# Patient Record
Sex: Male | Born: 1950 | Race: White | Hispanic: No | Marital: Married | Smoking: Former smoker
Health system: Southern US, Community
[De-identification: ages and names within clinical notes are randomized; demographics above are authoritative.]

## PROBLEM LIST (undated history)

## (undated) DIAGNOSIS — M1990 Unspecified osteoarthritis, unspecified site: Secondary | ICD-10-CM

## (undated) DIAGNOSIS — I10 Essential (primary) hypertension: Secondary | ICD-10-CM

## (undated) DIAGNOSIS — E785 Hyperlipidemia, unspecified: Secondary | ICD-10-CM

## (undated) DIAGNOSIS — C801 Malignant (primary) neoplasm, unspecified: Secondary | ICD-10-CM

## (undated) DIAGNOSIS — I2510 Atherosclerotic heart disease of native coronary artery without angina pectoris: Secondary | ICD-10-CM

## (undated) DIAGNOSIS — K219 Gastro-esophageal reflux disease without esophagitis: Secondary | ICD-10-CM

## (undated) DIAGNOSIS — C4490 Unspecified malignant neoplasm of skin, unspecified: Secondary | ICD-10-CM

## (undated) HISTORY — PX: CORONARY ANGIOPLASTY: SHX604

## (undated) HISTORY — PX: APPENDECTOMY: SHX54

## (undated) HISTORY — PX: OTHER SURGICAL HISTORY: SHX169

## (undated) HISTORY — PX: TONSILLECTOMY: SUR1361

---

## 2006-12-06 ENCOUNTER — Ambulatory Visit: Payer: Self-pay | Admitting: Gastroenterology

## 2010-01-08 ENCOUNTER — Inpatient Hospital Stay: Payer: Self-pay | Admitting: Cardiology

## 2010-01-08 IMAGING — CR DG CHEST 2V
1 series · 2 of 2 positions shown · non-contrast
Comparison: none

REASON FOR EXAM: CP
COMMENTS:

PROCEDURE:     DXR - DXR CHEST PA (OR AP) AND LATERAL  - January 08, 2010  [DATE]
RESULT:     The lung fields are clear. The heart, mediastinal and osseous
structures show no significant abnormalities.

[Series 1: view not recorded · 0.17mm/px · 2 of 2 slices shown]
[im 1/2]
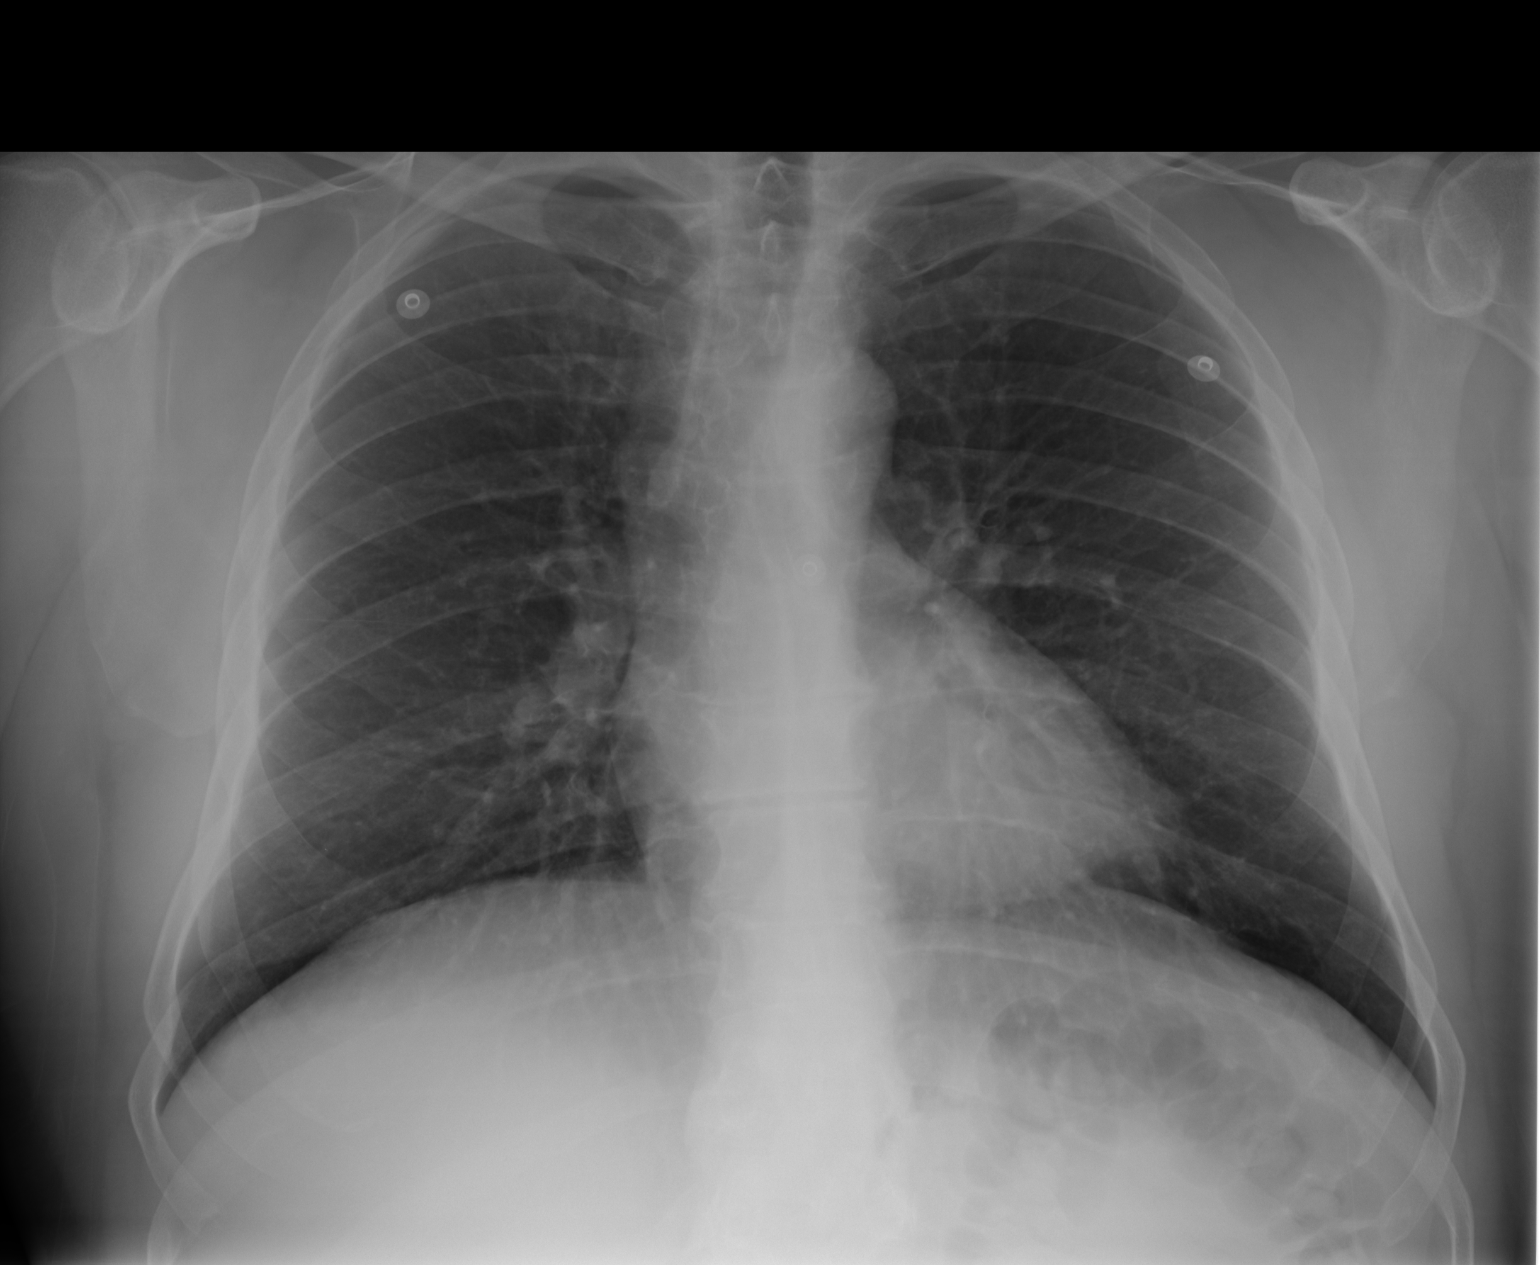
[im 2/2]
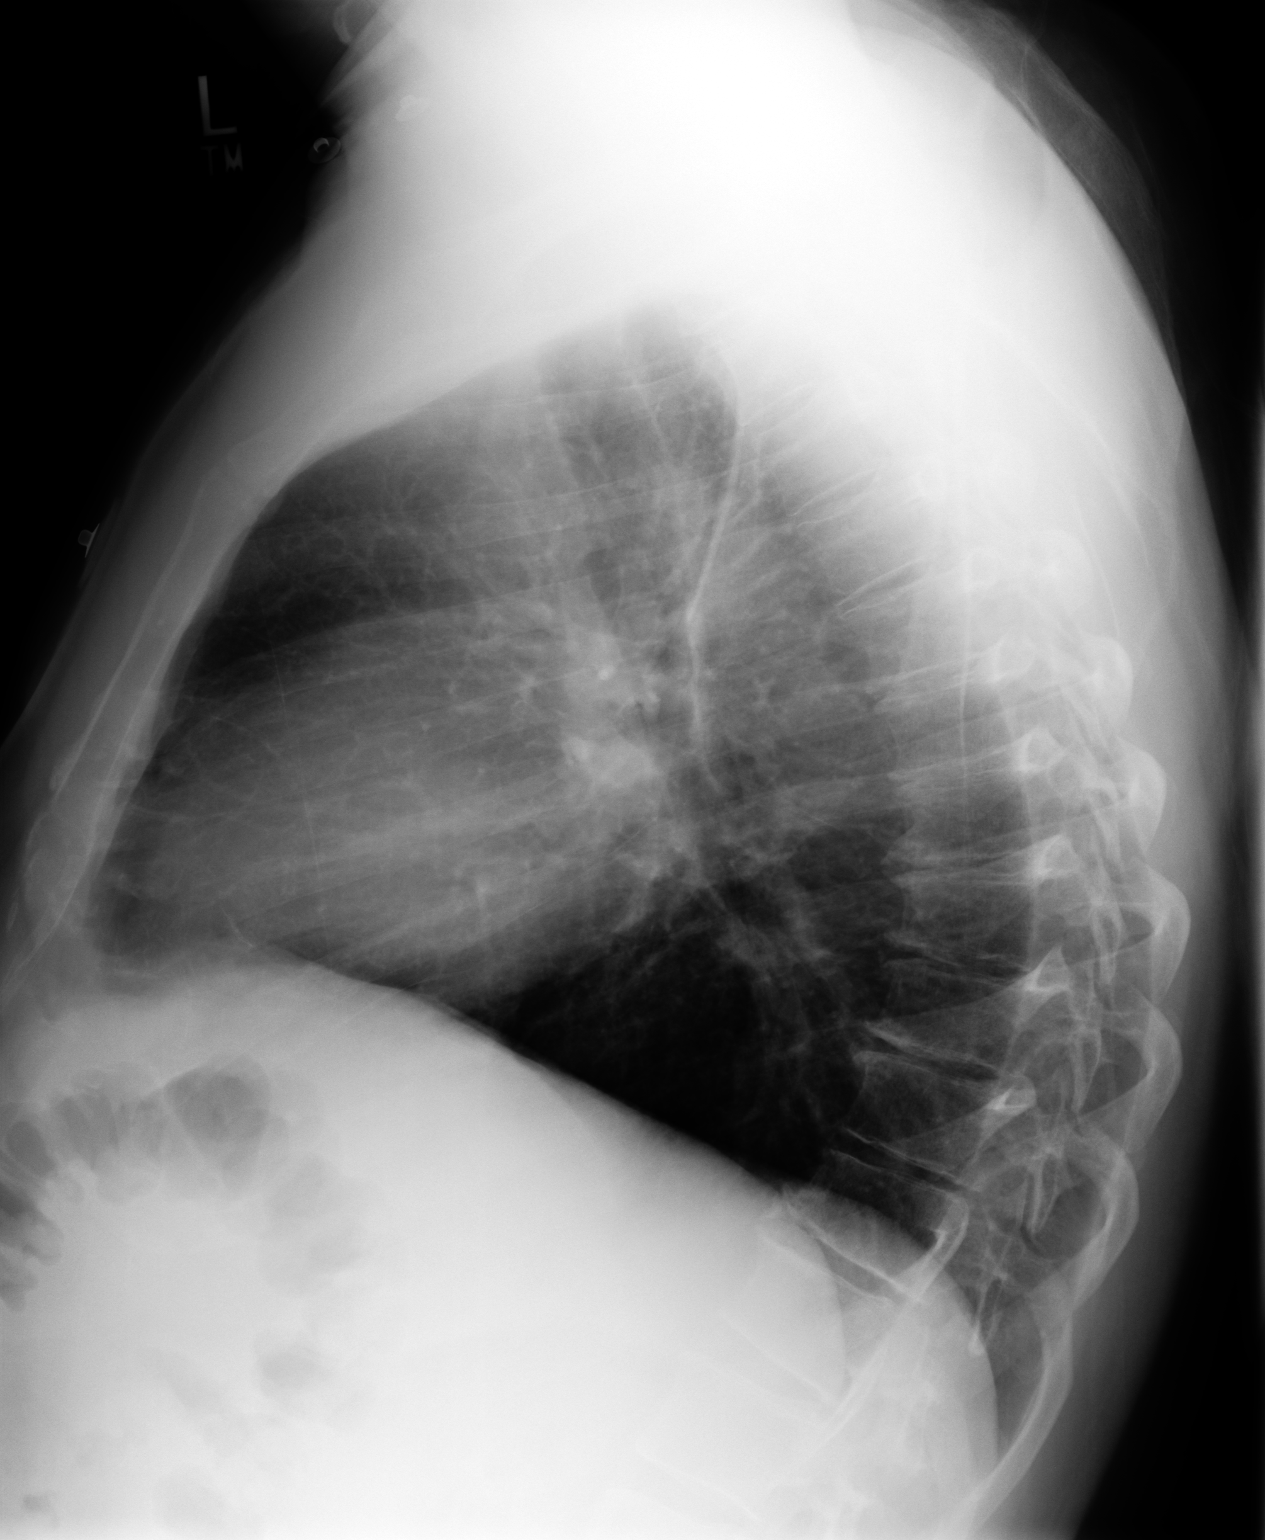

[2 of 2 positions shown; findings below may reference images not displayed]

IMPRESSION: 1.     No acute changes are identified.

## 2013-06-27 ENCOUNTER — Other Ambulatory Visit: Payer: Self-pay | Admitting: Urology

## 2013-07-03 NOTE — Patient Instructions (Signed)
Jaylon Boylen  07/03/2013   Your procedure is scheduled on: 07/16/13              Surgery [id number]   Report to [facility] at    0515  AM.  Call this number if you have problems the morning of surgery: [phone]   Remember:   Do not eat food or drink liquids after midnight.   Take these medicines the morning of surgery with A SIP OF WATER:    Do not wear jewelry,   Do not wear lotions, powders, or perfumes.   . Men may shave face and neck.  Do not bring valuables to the hospital.  Contacts, dentures or bridgework may not be worn into surgery.  Leave suitcase in the car. After surgery it may be brought to your room.  For patients admitted to the hospital, checkout time is 11:00 AM the day of  discharge.   SEE CHG INSTRUCTION SHEET    Please read over the following fact sheets that you were given:  coughing and deep breathing exercises, leg exercises, Incentive Spirometry Fact Sheet, Blood Transfusion Fact Sheet                Failure to comply with these instructions may result in cancellation of your surgery.                Patient Signature ____________________________              Nurse Signature _____________________________

## 2013-07-04 ENCOUNTER — Ambulatory Visit (HOSPITAL_COMMUNITY)
Admission: RE | Admit: 2013-07-04 | Discharge: 2013-07-04 | Disposition: A | Discharge status: Home / Self Care | Payer: BC Managed Care – PPO | Source: Ambulatory Visit | Attending: Urology | Admitting: Urology

## 2013-07-04 ENCOUNTER — Encounter (HOSPITAL_COMMUNITY)
Admission: RE | Admit: 2013-07-04 | Discharge: 2013-07-04 | Disposition: A | Discharge status: Home / Self Care | Payer: BC Managed Care – PPO | Source: Ambulatory Visit | Attending: Urology | Admitting: Urology

## 2013-07-04 ENCOUNTER — Encounter (HOSPITAL_COMMUNITY): Payer: Self-pay | Admitting: Pharmacy Technician

## 2013-07-04 ENCOUNTER — Encounter (HOSPITAL_COMMUNITY): Payer: Self-pay

## 2013-07-04 DIAGNOSIS — Z01812 Encounter for preprocedural laboratory examination: Secondary | ICD-10-CM | POA: Insufficient documentation

## 2013-07-04 DIAGNOSIS — I10 Essential (primary) hypertension: Secondary | ICD-10-CM | POA: Insufficient documentation

## 2013-07-04 DIAGNOSIS — Z01818 Encounter for other preprocedural examination: Secondary | ICD-10-CM | POA: Insufficient documentation

## 2013-07-04 DIAGNOSIS — Z01810 Encounter for preprocedural cardiovascular examination: Secondary | ICD-10-CM | POA: Insufficient documentation

## 2013-07-04 HISTORY — DX: Unspecified osteoarthritis, unspecified site: M19.90

## 2013-07-04 HISTORY — DX: Essential (primary) hypertension: I10

## 2013-07-04 HISTORY — DX: Atherosclerotic heart disease of native coronary artery without angina pectoris: I25.10

## 2013-07-04 HISTORY — DX: Gastro-esophageal reflux disease without esophagitis: K21.9

## 2013-07-04 HISTORY — DX: Malignant (primary) neoplasm, unspecified: C80.1

## 2013-07-04 LAB — CBC
MCV: 90.1 fL (ref 78.0–100.0)
Platelets: 218 10*3/uL (ref 150–400)
RBC: 4.86 MIL/uL (ref 4.22–5.81)
WBC: 8.1 10*3/uL (ref 4.0–10.5)

## 2013-07-04 LAB — BASIC METABOLIC PANEL
CO2: 28 mEq/L (ref 19–32)
Calcium: 10.1 mg/dL (ref 8.4–10.5)
Chloride: 103 mEq/L (ref 96–112)
GFR calc Af Amer: 76 mL/min — ABNORMAL LOW (ref >90)
Sodium: 138 mEq/L (ref 135–145)

## 2013-07-04 IMAGING — CR DG CHEST 2V
2 series · 2 of 2 positions shown · non-contrast
Comparison: None

CLINICAL DATA: Preop robotic prostatectomy, history of hypertension
and cardiac stents

CHEST - 2 VIEW

[w chest pa]
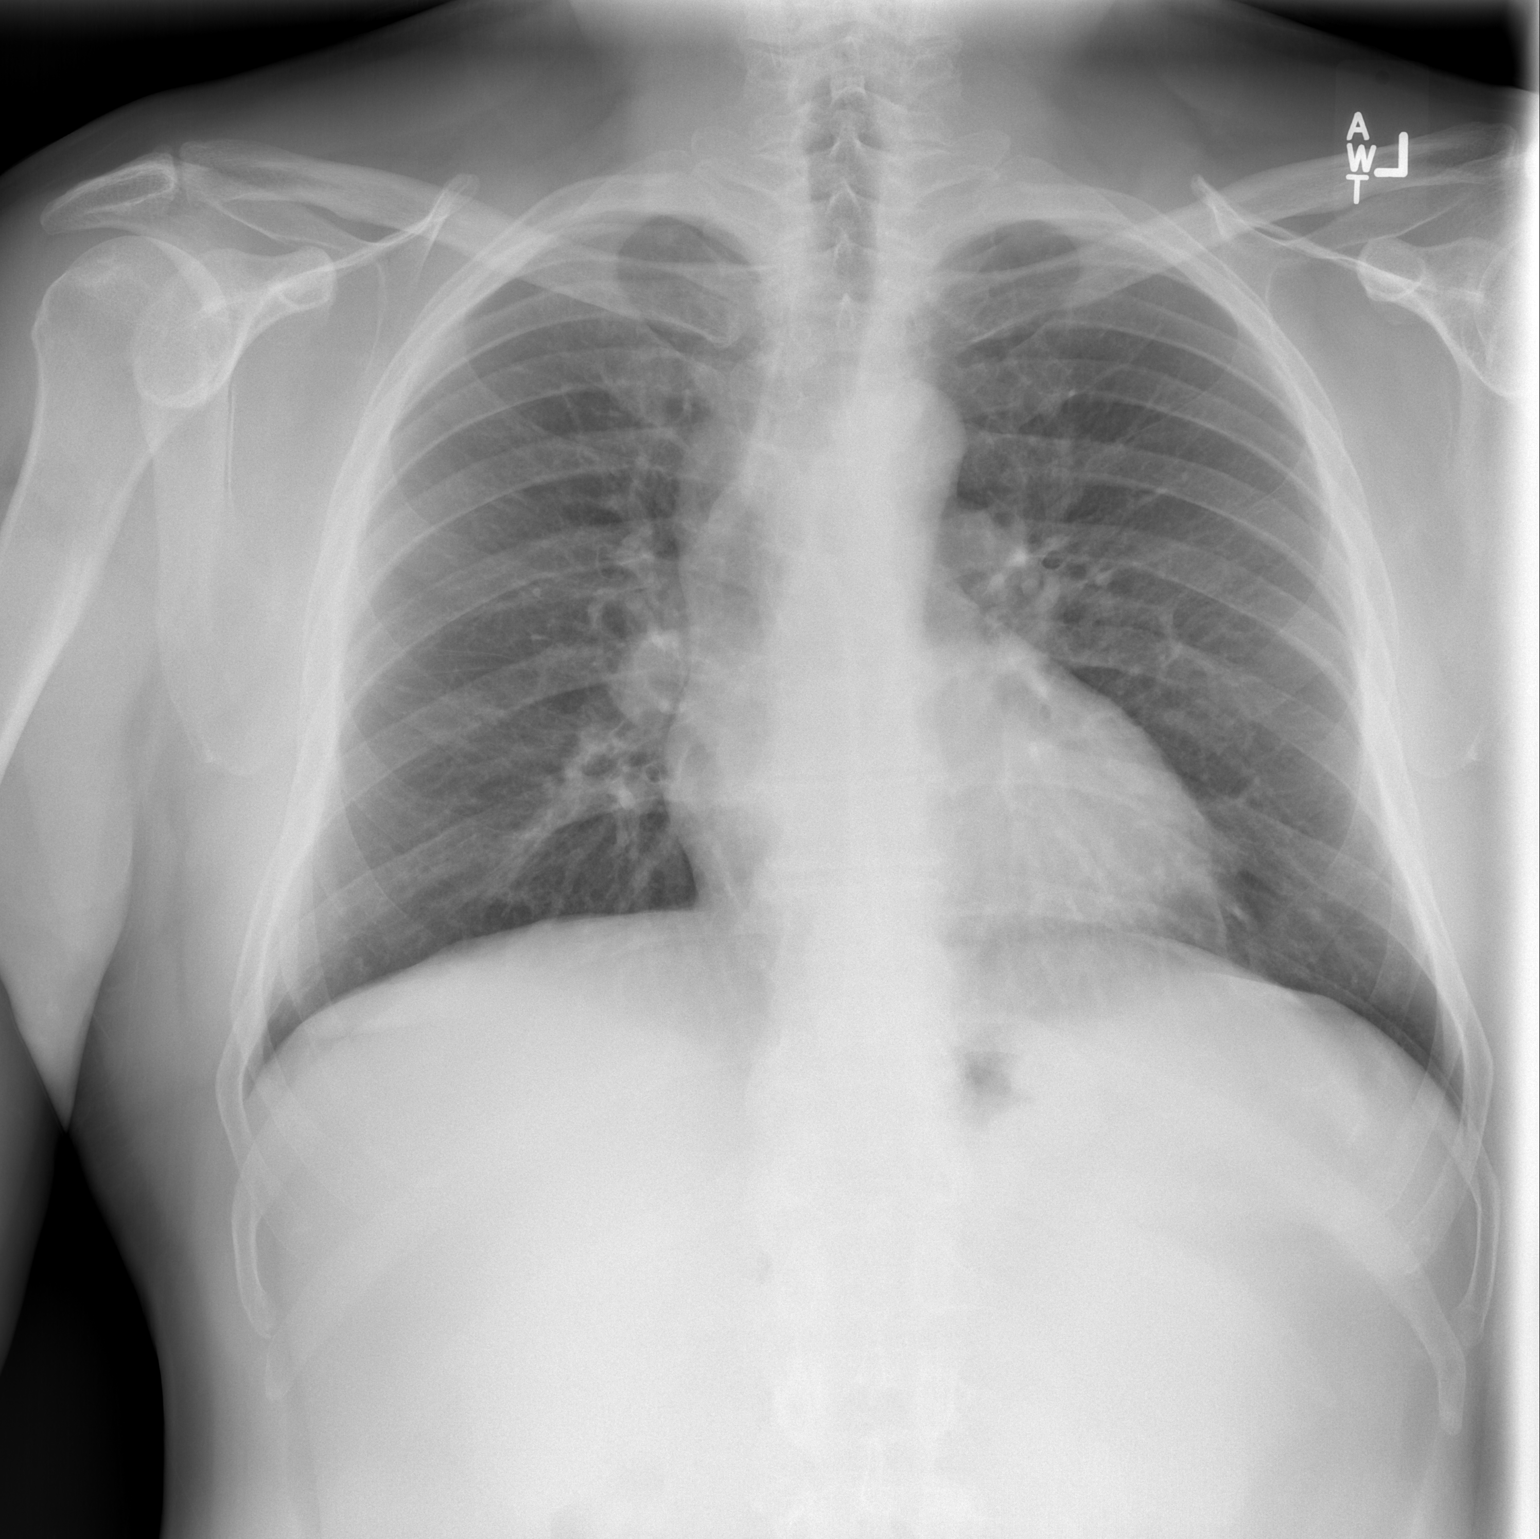

[w chest lat]
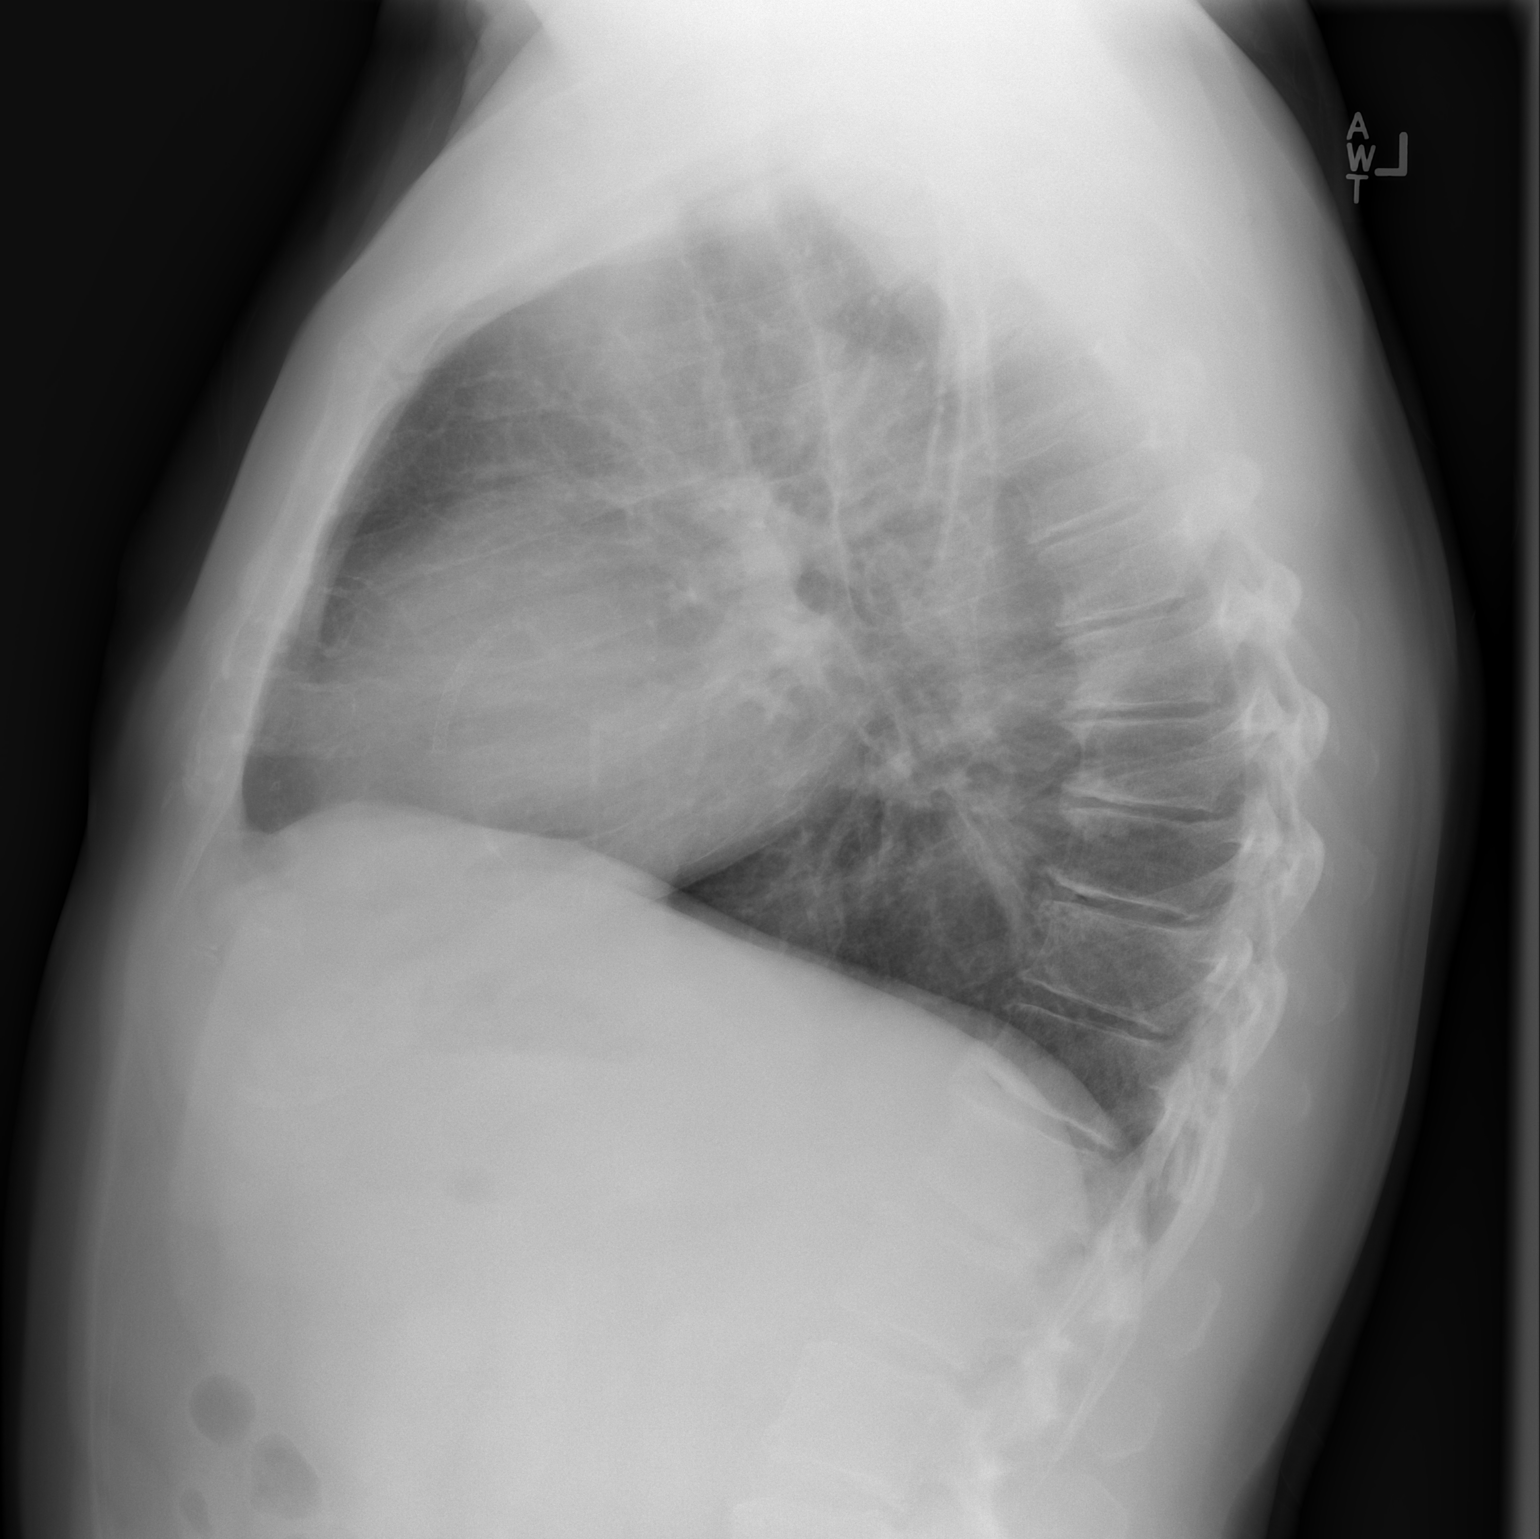

[2 of 2 positions shown; findings below may reference images not displayed]

FINDINGS: The heart size and vascular pattern are normal.  The lungs
are clear.  No pleural effusions.}
IMPRESSION: [No acute findings.]

## 2013-07-04 NOTE — Progress Notes (Signed)
Stent Mid RCA- 2000 at [facility]  Stent prox RCA 01/09/10 at [facility]  Last office visit note with Dr Darrold Junker 04/03/2013 on chart Last EKG from Dr Darrold Junker 04/2012 on chart

## 2013-07-13 NOTE — H&P (Signed)
Chief Complaint  Prostate Cancer   Reason For Visit  Reason for consult: To discuss treatment options for prostate cancer and specifically to consider a robotic prostatectomy. Physician requesting consult: Dr. Jethro Ellison PCP: Dr. Bethann Punches   History of Present Illness  Cody Ellison is a 62 year old who was noted to have a prostate nodule during a routine physical exam this year.  His PSA was 4.67. He was seen by Dr. Patsi Sears and confirmed to have a nodule at the right base of the prostate prompting a prostate biopsy on 05/29/13.  This confirmed Gleason 4+3=7 adenocarcinoma of the prostate with 10 out of 12 biopsy cores positive for malignancy. Dr. Patsi Sears ordered a CT scan of the abdomen and pelvis on 06/13/13 which was negative for obvious metastatic disease. He has no family history of prostate cancer.  His father lived to be 55.   ** He does have a history of coronary artery disease he initially underwent angioplasty approximately 20 years ago and subsequently had a cardiac stent placed about 15 years ago.  More recently, he had a drug-eluting cardiac stent placed 3-4 years ago.  He currently is on chronic antiplatelet therapy with aspirin 325 mg daily.  His cardiologist is Dr. Dorma Russell in [place].  TNM stage: cT2b N0 M0 (R base) PSA: 4.67 Gleason score: 4+3=7 Biopsy (05/29/13): 10/12 cores positive   Left: L lateral apex (30%, 3+3=6), L apex (60%, 3+3=6, PNI), L lateral mid (90%, 3+3=6), L mid (60%, 3+3=6), L lateral base (70%, 3+3=6), L base (95%< 3+4=7)   Right: R lateral apex (70%, 4+3=7), R mid (60%, 4+3=7), R lateral mid (90%, 4+3=7), R lateral base (90%, 4+3=7, PNI) Prostate volume: 61.0 cc  Nomogram: OC disease: 66% EPE: 41% SVI: 3% LNI: 2.3% PFS (surgery): 84%, 77%  Urinary function: He has minimal baseline voiding symptoms.  IPSS is 5. Erectile function: He does have severe pre-existing erectile dysfunction.  SHIM score is 8.   Past Medical  History Problems  1. History of  Coronary Artery Disease V12.59 2. History of  Esophageal Reflux 530.81 3. History of  Hyperlipidemia 272.4 4. History of  Hypertension 401.9  Surgical History Problems  1. History of  Adenoidectomy 2. History of  Appendectomy 3. History of  Cath Laser Angioplasty 4. History of  Cath Stent Placement 5. History of  Cath Stent Placement 6. History of  Tonsillectomy  Current Meds 1. Aspirin 325 MG Oral Tablet; Therapy: (Recorded:09Jun2014) to 2. Atenolol 100 MG Oral Tablet; Therapy: (Recorded:09Jun2014) to 3. Calcium 600 + D TABS; Therapy: (Recorded:09Jun2014) to 4. CoQ10 CAPS; Therapy: (Recorded:09Jun2014) to 5. Crestor 20 MG Oral Tablet; Therapy: (Recorded:09Jun2014) to 6. Felodipine ER 10 MG Oral Tablet Extended Release 24 Hour; Therapy: (Recorded:09Jun2014) to 7. Folic Acid 800 MCG Oral Tablet; Therapy: (Recorded:09Jun2014) to 8. Lisinopril-Hydrochlorothiazide 20-12.5 MG Oral Tablet; Therapy: (Recorded:09Jun2014) to 9. Niacin 500 MG Oral Tablet; Therapy: (Recorded:09Jun2014) to 10. Omeprazole 20 MG Oral Capsule Delayed Release; Therapy: (Recorded:09Jun2014) to 11. Super B-Complex TABS; Therapy: (Recorded:09Jun2014) to 12. Vitamin E 400 UNIT Oral Capsule; Therapy: (Recorded:09Jun2014) to  Allergies Medication  1. Iodine SOLN 2. Sulfa Drugs 3. Ampicillin CAPS Non-Medication  4. Contrast Dye  Family History Problems  1. Fraternal history of  Cancer 2. Maternal history of  Cervical Cancer 3. Family history of  Father Deceased At Age 91 4. Family history of  Mother Deceased At Age 109 Denied  5. Family history of  Prostate Cancer  Social History Problems    Former Smoker [phone]  1 ppd x 15yrs   Marital History - Currently Married   Occupation: Owns Industrial/product designer    History of  Alcohol Use  Review of Systems Genitourinary, constitutional, skin, eye, otolaryngeal, hematologic/lymphatic, cardiovascular, pulmonary,  endocrine, musculoskeletal, gastrointestinal, neurological and psychiatric system(s) were reviewed and pertinent findings if present are noted.  Cardiovascular: no chest pain and no leg swelling.  Respiratory: no shortness of breath and no shortness of breath during exertion.    Vitals Vital Signs [Data Includes: Last 1 Day]  26Aug2014 09:06AM  BMI Calculated: 31.42 BSA Calculated: 2.15 Height: 5 ft 9.5 in Weight: 217 lb  Blood Pressure: 147 / 65 Temperature: 97.4 F Heart Rate: 62  Physical Exam Constitutional: Well nourished and well developed . No acute distress.  ENT:. The ears and nose are normal in appearance.  Neck: The appearance of the neck is normal and no neck mass is present.  Pulmonary: No respiratory distress, normal respiratory rhythm and effort and clear bilateral breath sounds.  Cardiovascular: Heart rate and rhythm are normal . No peripheral edema.  Abdomen: right lower quadrant incision site(s) well healed. The abdomen is soft and nontender. No masses are palpated. No CVA tenderness. No hernias are palpable. No hepatosplenomegaly noted.  Rectal: His prostate measures approximately 40 cc. He does have significant nodularity located along the right base which appears confined to the prostate although it is a fairly sizable lesion and encompasses approximately 1 half of the prostate's right lobe.  Lymphatics: The femoral and inguinal nodes are not enlarged or tender.  Skin: Normal skin turgor, no visible rash and no visible skin lesions.  Neuro/Psych:. Mood and affect are appropriate.    Results/Data Urine [Data Includes: Last 1 Day]   26Aug2014  COLOR YELLOW   APPEARANCE CLEAR   SPECIFIC GRAVITY 1.015   pH 5.5   GLUCOSE NEG mg/dL  BILIRUBIN NEG   KETONE NEG mg/dL  BLOOD NEG   PROTEIN NEG mg/dL  UROBILINOGEN 0.2 mg/dL  NITRITE NEG   LEUKOCYTE ESTERASE NEG   Selected Results  BUN & CREATININE 13Aug2014 09:06AM Cody Ellison  SPECIMEN TYPE: BLOOD    Test Name Result Flag Reference  CREATININE 1.40 mg/dL  1.61-0.96  BUN 25 mg/dL  0-45  Est GFR, African American 62 mL/min    PERFORMED AT:        [facility]                      [address]                      [place], [place] [address]  Est GFR, NonAfrican American 54 mL/min L   THE ESTIMATED GFR IS A CALCULATION VALID FOR ADULTS (>=27 YEARS OLD) THAT USES THE CKD-EPI ALGORITHM TO ADJUST FOR AGE AND SEX. IT IS   NOT TO BE USED FOR CHILDREN, PREGNANT WOMEN, HOSPITALIZED PATIENTS,    PATIENTS ON DIALYSIS, OR WITH RAPIDLY CHANGING KIDNEY FUNCTION. ACCORDING TO THE NKDEP, EGFR >89 IS NORMAL, 60-89 SHOWS MILD IMPAIRMENT, 30-59 SHOWS MODERATE IMPAIRMENT, 15-29 SHOWS SEVERE IMPAIRMENT AND <15 IS ESRD.   CT-ABD/PELVIS W/W/O CONTRAST 13Aug2014 12:00AM Cody Ellison   Test Name Result Flag Reference  ** RADIOLOGY REPORT BY [facility] **   *RADIOLOGY REPORT*  Clinical Data: Newly diagnosed prostate cancer  CT ABDOMEN AND PELVIS WITHOUT AND WITH CONTRAST  Technique: Multidetector CT imaging of the abdomen and pelvis was performed without contrast material in one or  both body regions, followed by contrast material(s) and further sections in one or both body regions.  Contrast: 125 ml Isovue 300 IV  Comparison: None.  Findings: Lung bases are clear.  Liver, spleen, pancreas, and adrenal glands are within normal limits.  Gallbladder is unremarkable. No intrahepatic or extrahepatic ductal dilatation.  Kidneys are within normal limits. No hydronephrosis.  No evidence of bowel obstruction. Prior appendectomy. Atherosclerotic calcifications of the abdominal aorta and branch vessels.  No abdominopelvic ascites.  No suspicious abdominopelvic lymphadenopathy.  Prostate is notable for mild enlargement of the central gland which indents the base of the bladder (series 3/image 86).  No ureteral or bladder calculi.  On delayed imaging, there  are no filling defects in the bilateral opacified proximal collecting systems or ureters, or bladder. A segment of the distal left ureter remains unopacified.  Degenerative changes of the visualized thoracolumbar spine. No focal osseous lesions.  IMPRESSION: Prostate is notable for mild enlargement of the central gland which indents the base of the bladder.  No evidence of metastatic disease in the abdomen/pelvis.   Original Report Authenticated By: Charline Bills, M.D.     I have reviewed his medical records, pathology report, CT scan, and PSA results.  Findings are as dictated above.  Assessment Assessed  1. Prostate Cancer 185  Plan Health Maintenance (V70.0)  1. UA With REFLEX  Done: 26Aug2014 08:55AM Prostate Cancer (185)  2. Follow-up Schedule Surgery Office  Follow-up  Done: 26Aug2014 3. PT/OT Referral Referral  Referral  Requested for: 26Aug2014  Discussion/Summary  1.  Prostate cancer: I had a long detailed discussion with Mr. Willert and his wife today.  I recommended therapy of curative intent considering his relatively high-grade disease.  We reviewed options and specifically focused our discussion today on surgical treatment options and radiotherapy options.   The patient was counseled about the natural history of prostate cancer and the standard treatment options that are available for prostate cancer. It was explained to him how his age and life expectancy, clinical stage, Gleason score, and PSA affect his prognosis, the decision to proceed with additional staging studies, as well as how that information influences recommended treatment strategies. We discussed the roles for active surveillance, radiation therapy, surgical therapy, androgen deprivation, as well as ablative therapy options for the treatment of prostate cancer as appropriate to his individual cancer situation. We discussed the risks and benefits of these options with regard to their impact on cancer  control and also in terms of potential adverse events, complications, and impact on quiality of life particularly related to urinary, bowel, and sexual function. The patient was encouraged to ask questions throughout the discussion today and all questions were answered to his stated satisfaction. In addition, the patient was provided with and/or directed to appropriate resources and literature for further education about prostate cancer and treatment options.   We discussed surgical therapy for prostate cancer including the different available surgical approaches. We discussed, in detail, the risks and expectations of surgery with regard to cancer control, urinary control, and erectile function as well as the expected postoperative recovery process. Additional risks of surgery including but not limited to bleeding, infection, hernia formation, nerve damage, lymphocele formation, bowel/rectal injury potentially necessitating colostomy, damage to the urinary tract resulting in urine leakage, urethral stricture, and the cardiopulmonary risks such as myocardial infarction, stroke, death, venothromboembolism, etc. were explained. The risk of open surgical conversion for robotic/laparoscopic prostatectomy was also discussed.   After our discussion, he is most inclined  to proceed with surgical therapy.  I did offer him a radiation oncology consultation which also was offered to him by Dr. Patsi Sears.  He really does not wish to consider radiation therapy and wishes to proceed with surgical treatment.  I did recommend that he receive cardiac clearance prior to surgery.  Furthermore, I recommended that he switched to aspirin 81 mg perioperatively but to continue this around the time of surgery to minimize risk of thrombosing his cardiac stent.  He feels very well informed and is agreeable to proceed with surgical therapy.  He will be scheduled for a unilateral left nerve sparing robotic-assisted laparoscopic radical  prostatectomy and bilateral pelvic lymphadenectomy.  Cc: Dr. Jethro Ellison Dr. Bethann Punches    SignaturesElectronically signed by : Heloise Purpura, M.D.; Jun 26 2013  3:23PM

## 2013-07-15 ENCOUNTER — Encounter (HOSPITAL_COMMUNITY): Payer: Self-pay | Admitting: Anesthesiology

## 2013-07-15 NOTE — Anesthesia Preprocedure Evaluation (Addendum)
Anesthesia Evaluation  Patient identified by MRN, date of birth, ID band Patient awake    Reviewed: Allergy & Precautions, H&P , NPO status , Patient's Chart, lab work & pertinent test results  Airway Mallampati: II TM Distance: >3 FB Neck ROM: Full    Dental no notable dental hx.    Pulmonary former smoker,  CXR: No acute findings breath sounds clear to auscultation  Pulmonary exam normal       Cardiovascular Exercise Tolerance: Good hypertension, Pt. on medications and Pt. on home beta blockers + CAD and + Cardiac Stents Rhythm:Regular Rate:Normal  ECG:SB 54 otherwise normal.  2 Stents RCA last in 2011. No cardiac symptoms. Clearance Cardiologist June 2014   Neuro/Psych negative neurological ROS  negative psych ROS   GI/Hepatic Neg liver ROS, GERD-  Medicated,  Endo/Other  negative endocrine ROS  Renal/GU negative Renal ROS  negative genitourinary   Musculoskeletal negative musculoskeletal ROS (+)   Abdominal (+) + obese,   Peds negative pediatric ROS (+)  Hematology negative hematology ROS (+)   Anesthesia Other Findings   Reproductive/Obstetrics negative OB ROS                        Anesthesia Physical Anesthesia Plan  ASA: III  Anesthesia Plan: General   Post-op Pain Management:    Induction: Intravenous  Airway Management Planned: Oral ETT  Additional Equipment:   Intra-op Plan:   Post-operative Plan: Extubation in OR  Informed Consent: I have reviewed the patients History and Physical, chart, labs and discussed the procedure including the risks, benefits and alternatives for the proposed anesthesia with the patient or authorized representative who has indicated his/her understanding and acceptance.   Dental advisory given  Plan Discussed with: CRNA  Anesthesia Plan Comments:        Anesthesia Quick Evaluation

## 2013-07-16 ENCOUNTER — Ambulatory Visit (HOSPITAL_COMMUNITY): Payer: BC Managed Care – PPO | Admitting: Anesthesiology

## 2013-07-16 ENCOUNTER — Encounter (HOSPITAL_COMMUNITY)
Admission: RE | Disposition: A | Discharge status: Home / Self Care | Payer: Self-pay | Source: Ambulatory Visit | Attending: Urology

## 2013-07-16 ENCOUNTER — Encounter (HOSPITAL_COMMUNITY): Payer: Self-pay | Admitting: Anesthesiology

## 2013-07-16 ENCOUNTER — Observation Stay (HOSPITAL_COMMUNITY)
Admission: RE | Admit: 2013-07-16 | Discharge: 2013-07-17 | Disposition: A | Discharge status: Home / Self Care | Payer: BC Managed Care – PPO | Source: Ambulatory Visit | Attending: Urology | Admitting: Urology

## 2013-07-16 DIAGNOSIS — C61 Malignant neoplasm of prostate: Principal | ICD-10-CM | POA: Insufficient documentation

## 2013-07-16 DIAGNOSIS — K219 Gastro-esophageal reflux disease without esophagitis: Secondary | ICD-10-CM | POA: Insufficient documentation

## 2013-07-16 DIAGNOSIS — Z7982 Long term (current) use of aspirin: Secondary | ICD-10-CM | POA: Insufficient documentation

## 2013-07-16 DIAGNOSIS — Z7902 Long term (current) use of antithrombotics/antiplatelets: Secondary | ICD-10-CM | POA: Insufficient documentation

## 2013-07-16 DIAGNOSIS — E785 Hyperlipidemia, unspecified: Secondary | ICD-10-CM | POA: Insufficient documentation

## 2013-07-16 DIAGNOSIS — I10 Essential (primary) hypertension: Secondary | ICD-10-CM | POA: Insufficient documentation

## 2013-07-16 DIAGNOSIS — I2510 Atherosclerotic heart disease of native coronary artery without angina pectoris: Secondary | ICD-10-CM | POA: Insufficient documentation

## 2013-07-16 HISTORY — PX: ROBOT ASSISTED LAPAROSCOPIC RADICAL PROSTATECTOMY: SHX5141

## 2013-07-16 LAB — HEMOGLOBIN AND HEMATOCRIT, BLOOD
HCT: 44.7 % (ref 39.0–52.0)
Hemoglobin: 15.2 g/dL (ref 13.0–17.0)

## 2013-07-16 SURGERY — ROBOTIC ASSISTED LAPAROSCOPIC RADICAL PROSTATECTOMY LEVEL 2
Anesthesia: General | Wound class: Clean Contaminated

## 2013-07-16 MED ORDER — LIDOCAINE HCL (CARDIAC) 20 MG/ML IV SOLN
INTRAVENOUS | Status: DC | PRN
  Administered 2013-07-16: 50 mg via INTRAVENOUS

## 2013-07-16 MED ORDER — DIPHENHYDRAMINE HCL 12.5 MG/5ML PO ELIX: 12.5000 mg | ORAL_SOLUTION | Freq: Four times a day (QID) | ORAL | Status: DC | PRN

## 2013-07-16 MED ORDER — PANTOPRAZOLE SODIUM 40 MG PO TBEC
40.0000 mg | DELAYED_RELEASE_TABLET | Freq: Every day | ORAL | Status: DC
  Administered 2013-07-17: 10:00:00 40 mg via ORAL
  Filled 2013-07-16: qty 1

## 2013-07-16 MED ORDER — PROMETHAZINE HCL 25 MG/ML IJ SOLN: 6.2500 mg | INTRAMUSCULAR | Status: DC | PRN

## 2013-07-16 MED ORDER — ATORVASTATIN CALCIUM 40 MG PO TABS
40.0000 mg | ORAL_TABLET | Freq: Every day | ORAL | Status: DC
  Administered 2013-07-16: 18:00:00 40 mg via ORAL
  Filled 2013-07-16 (×2): qty 1

## 2013-07-16 MED ORDER — HYDROMORPHONE HCL PF 1 MG/ML IJ SOLN
INTRAMUSCULAR | Status: DC | PRN
  Administered 2013-07-16 (×2): 0.5 mg via INTRAVENOUS

## 2013-07-16 MED ORDER — LISINOPRIL-HYDROCHLOROTHIAZIDE 20-12.5 MG PO TABS: 1.0000 | ORAL_TABLET | Freq: Every morning | ORAL | Status: DC

## 2013-07-16 MED ORDER — MORPHINE SULFATE 2 MG/ML IJ SOLN
2.0000 mg | INTRAMUSCULAR | Status: DC | PRN
  Administered 2013-07-16: 2 mg via INTRAVENOUS
  Filled 2013-07-16: qty 1

## 2013-07-16 MED ORDER — KCL IN DEXTROSE-NACL 20-5-0.45 MEQ/L-%-% IV SOLN
INTRAVENOUS | Status: DC
  Administered 2013-07-16 – 2013-07-17 (×2): via INTRAVENOUS
  Filled 2013-07-16 (×5): qty 1000

## 2013-07-16 MED ORDER — HYDROCHLOROTHIAZIDE 12.5 MG PO CAPS
12.5000 mg | ORAL_CAPSULE | Freq: Every day | ORAL | Status: DC
  Administered 2013-07-17: 12.5 mg via ORAL
  Filled 2013-07-16: qty 1

## 2013-07-16 MED ORDER — LISINOPRIL 20 MG PO TABS
20.0000 mg | ORAL_TABLET | Freq: Every day | ORAL | Status: DC
  Administered 2013-07-17: 10:00:00 20 mg via ORAL
  Filled 2013-07-16: qty 1

## 2013-07-16 MED ORDER — BUPIVACAINE-EPINEPHRINE 0.25% -1:200000 IJ SOLN
INTRAMUSCULAR | Status: DC | PRN
  Administered 2013-07-16: 27 mL

## 2013-07-16 MED ORDER — STERILE WATER FOR IRRIGATION IR SOLN
Status: DC | PRN
  Administered 2013-07-16: 3000 mL

## 2013-07-16 MED ORDER — ACETAMINOPHEN 325 MG PO TABS: 650.0000 mg | ORAL_TABLET | ORAL | Status: DC | PRN

## 2013-07-16 MED ORDER — KETOROLAC TROMETHAMINE 15 MG/ML IJ SOLN
15.0000 mg | Freq: Four times a day (QID) | INTRAMUSCULAR | Status: DC
  Administered 2013-07-16 – 2013-07-17 (×4): 15 mg via INTRAVENOUS
  Filled 2013-07-16 (×5): qty 1

## 2013-07-16 MED ORDER — CEFAZOLIN SODIUM-DEXTROSE 2-3 GM-% IV SOLR
2.0000 g | INTRAVENOUS | Status: AC
  Administered 2013-07-16: 2 g via INTRAVENOUS

## 2013-07-16 MED ORDER — HYDROMORPHONE HCL PF 1 MG/ML IJ SOLN
0.2500 mg | INTRAMUSCULAR | Status: DC | PRN
  Administered 2013-07-16 (×4): 0.5 mg via INTRAVENOUS

## 2013-07-16 MED ORDER — SUFENTANIL CITRATE 50 MCG/ML IV SOLN
INTRAVENOUS | Status: DC | PRN
  Administered 2013-07-16: 5 ug via INTRAVENOUS
  Administered 2013-07-16 (×2): 10 ug via INTRAVENOUS
  Administered 2013-07-16 (×2): 5 ug via INTRAVENOUS

## 2013-07-16 MED ORDER — ASPIRIN EC 81 MG PO TBEC
81.0000 mg | DELAYED_RELEASE_TABLET | Freq: Every day | ORAL | Status: DC
  Administered 2013-07-16 – 2013-07-17 (×2): 81 mg via ORAL
  Filled 2013-07-16 (×2): qty 1

## 2013-07-16 MED ORDER — GLYCOPYRROLATE 0.2 MG/ML IJ SOLN
INTRAMUSCULAR | Status: DC | PRN
  Administered 2013-07-16: .8 mg via INTRAVENOUS

## 2013-07-16 MED ORDER — METOCLOPRAMIDE HCL 5 MG/ML IJ SOLN
INTRAMUSCULAR | Status: DC | PRN
  Administered 2013-07-16: 10 mg via INTRAVENOUS

## 2013-07-16 MED ORDER — INFLUENZA VAC SPLIT QUAD 0.5 ML IM SUSP
0.5000 mL | INTRAMUSCULAR | Status: AC
  Administered 2013-07-17: 0.5 mL via INTRAMUSCULAR
  Filled 2013-07-16 (×2): qty 0.5

## 2013-07-16 MED ORDER — LACTATED RINGERS IV SOLN
INTRAVENOUS | Status: DC | PRN
  Administered 2013-07-16 (×2): via INTRAVENOUS

## 2013-07-16 MED ORDER — ATENOLOL 100 MG PO TABS
100.0000 mg | ORAL_TABLET | Freq: Every morning | ORAL | Status: DC
  Administered 2013-07-17: 10:00:00 100 mg via ORAL
  Filled 2013-07-16: qty 1

## 2013-07-16 MED ORDER — SODIUM CHLORIDE 0.9 % IV BOLUS (SEPSIS)
1000.0000 mL | Freq: Once | INTRAVENOUS | Status: AC
  Administered 2013-07-16: 1000 mL via INTRAVENOUS

## 2013-07-16 MED ORDER — CISATRACURIUM BESYLATE (PF) 10 MG/5ML IV SOLN
INTRAVENOUS | Status: DC | PRN
  Administered 2013-07-16: 4 mg via INTRAVENOUS
  Administered 2013-07-16: 2 mg via INTRAVENOUS

## 2013-07-16 MED ORDER — HYDROMORPHONE HCL PF 1 MG/ML IJ SOLN
INTRAMUSCULAR | Status: AC
Start: 2013-07-16 — End: 2013-07-16
  Filled 2013-07-16: qty 1

## 2013-07-16 MED ORDER — HYDROCODONE-ACETAMINOPHEN 5-325 MG PO TABS: 1.0000 | ORAL_TABLET | Freq: Four times a day (QID) | ORAL | Status: DC | PRN

## 2013-07-16 MED ORDER — ROCURONIUM BROMIDE 100 MG/10ML IV SOLN
INTRAVENOUS | Status: DC | PRN
  Administered 2013-07-16: 50 mg via INTRAVENOUS

## 2013-07-16 MED ORDER — DOCUSATE SODIUM 100 MG PO CAPS
100.0000 mg | ORAL_CAPSULE | Freq: Two times a day (BID) | ORAL | Status: DC
  Administered 2013-07-16 – 2013-07-17 (×3): 100 mg via ORAL
  Filled 2013-07-16 (×4): qty 1

## 2013-07-16 MED ORDER — CEFAZOLIN SODIUM 1-5 GM-% IV SOLN
1.0000 g | Freq: Three times a day (TID) | INTRAVENOUS | Status: AC
  Administered 2013-07-16 – 2013-07-17 (×2): 1 g via INTRAVENOUS
  Filled 2013-07-16 (×2): qty 50

## 2013-07-16 MED ORDER — CIPROFLOXACIN HCL 500 MG PO TABS: 500.0000 mg | ORAL_TABLET | Freq: Two times a day (BID) | ORAL | Status: DC

## 2013-07-16 MED ORDER — SODIUM CHLORIDE 0.9 % IR SOLN
Status: DC | PRN
  Administered 2013-07-16: 1000 mL via INTRAVESICAL

## 2013-07-16 MED ORDER — NEOSTIGMINE METHYLSULFATE 1 MG/ML IJ SOLN
INTRAMUSCULAR | Status: DC | PRN
  Administered 2013-07-16: 5 mg via INTRAVENOUS

## 2013-07-16 MED ORDER — ONDANSETRON HCL 4 MG/2ML IJ SOLN
INTRAMUSCULAR | Status: DC | PRN
  Administered 2013-07-16: 4 mg via INTRAVENOUS

## 2013-07-16 MED ORDER — DIPHENHYDRAMINE HCL 50 MG/ML IJ SOLN: 12.5000 mg | Freq: Four times a day (QID) | INTRAMUSCULAR | Status: DC | PRN

## 2013-07-16 MED ORDER — KETOROLAC TROMETHAMINE 15 MG/ML IJ SOLN
INTRAMUSCULAR | Status: AC
  Filled 2013-07-16: qty 1

## 2013-07-16 MED ORDER — PROPOFOL 10 MG/ML IV BOLUS
INTRAVENOUS | Status: DC | PRN
  Administered 2013-07-16: 170 mg via INTRAVENOUS

## 2013-07-16 MED ORDER — HYDROMORPHONE HCL PF 1 MG/ML IJ SOLN
INTRAMUSCULAR | Status: AC
  Filled 2013-07-16: qty 1

## 2013-07-16 MED ORDER — KCL IN DEXTROSE-NACL 20-5-0.45 MEQ/L-%-% IV SOLN
INTRAVENOUS | Status: AC
  Filled 2013-07-16: qty 1000

## 2013-07-16 MED ORDER — CEFAZOLIN SODIUM-DEXTROSE 2-3 GM-% IV SOLR
INTRAVENOUS | Status: AC
  Filled 2013-07-16: qty 50

## 2013-07-16 MED ORDER — BUPIVACAINE-EPINEPHRINE PF 0.25-1:200000 % IJ SOLN
INTRAMUSCULAR | Status: AC
  Filled 2013-07-16: qty 30

## 2013-07-16 MED ORDER — HEPARIN SODIUM (PORCINE) 1000 UNIT/ML IJ SOLN
INTRAMUSCULAR | Status: AC
  Filled 2013-07-16: qty 1

## 2013-07-16 MED ORDER — LACTATED RINGERS IV SOLN
INTRAVENOUS | Status: DC | PRN
  Administered 2013-07-16: 08:00:00

## 2013-07-16 MED ORDER — MIDAZOLAM HCL 5 MG/5ML IJ SOLN
INTRAMUSCULAR | Status: DC | PRN
  Administered 2013-07-16 (×2): 1 mg via INTRAVENOUS

## 2013-07-16 SURGICAL SUPPLY — 42 items
CABLE HIGH FREQUENCY MONO STRZ (ELECTRODE) ×2 IMPLANT
CANISTER SUCTION 2500CC (MISCELLANEOUS) ×2 IMPLANT
CATH FOLEY 2WAY SLVR 18FR 30CC (CATHETERS) ×2 IMPLANT
CATH ROBINSON RED A/P 16FR (CATHETERS) ×2 IMPLANT
CATH ROBINSON RED A/P 8FR (CATHETERS) ×2 IMPLANT
CATH TIEMANN FOLEY 18FR 5CC (CATHETERS) ×2 IMPLANT
CHLORAPREP W/TINT 26ML (MISCELLANEOUS) ×2 IMPLANT
CLIP LIGATING HEM O LOK PURPLE (MISCELLANEOUS) ×4 IMPLANT
CLOTH BEACON ORANGE TIMEOUT ST (SAFETY) ×2 IMPLANT
CORD HIGH FREQUENCY UNIPOLAR (ELECTROSURGICAL) IMPLANT
COVER SURGICAL LIGHT HANDLE (MISCELLANEOUS) ×2 IMPLANT
COVER TIP SHEARS 8 DVNC (MISCELLANEOUS) ×1 IMPLANT
COVER TIP SHEARS 8MM DA VINCI (MISCELLANEOUS) ×1
CUTTER ECHEON FLEX ENDO 45 340 (ENDOMECHANICALS) ×2 IMPLANT
DECANTER SPIKE VIAL GLASS SM (MISCELLANEOUS) ×2 IMPLANT
DRAPE SURG IRRIG POUCH 19X23 (DRAPES) ×2 IMPLANT
DRSG TEGADERM 2-3/8X2-3/4 SM (GAUZE/BANDAGES/DRESSINGS) IMPLANT
DRSG TEGADERM 4X4.75 (GAUZE/BANDAGES/DRESSINGS) IMPLANT
DRSG TEGADERM 6X8 (GAUZE/BANDAGES/DRESSINGS) ×4 IMPLANT
ELECT REM PT RETURN 9FT ADLT (ELECTROSURGICAL) ×2
ELECTRODE REM PT RTRN 9FT ADLT (ELECTROSURGICAL) ×1 IMPLANT
GAUZE SPONGE 2X2 8PLY STRL LF (GAUZE/BANDAGES/DRESSINGS) ×1 IMPLANT
GLOVE BIO SURGEON STRL SZ 6.5 (GLOVE) ×2 IMPLANT
GLOVE BIOGEL M STRL SZ7.5 (GLOVE) ×4 IMPLANT
GOWN PREVENTION PLUS LG XLONG (DISPOSABLE) ×6 IMPLANT
GOWN STRL REIN XL XLG (GOWN DISPOSABLE) ×4 IMPLANT
HOLDER FOLEY CATH W/STRAP (MISCELLANEOUS) ×2 IMPLANT
IV LACTATED RINGERS 1000ML (IV SOLUTION) IMPLANT
KIT ACCESSORY DA VINCI DISP (KITS) ×1
KIT ACCESSORY DVNC DISP (KITS) ×1 IMPLANT
NDL SAFETY ECLIPSE 18X1.5 (NEEDLE) ×1 IMPLANT
NEEDLE HYPO 18GX1.5 SHARP (NEEDLE) ×1
PACK ROBOT UROLOGY CUSTOM (CUSTOM PROCEDURE TRAY) ×2 IMPLANT
RELOAD GREEN ECHELON 45 (STAPLE) ×2 IMPLANT
SET TUBE IRRIG SUCTION NO TIP (IRRIGATION / IRRIGATOR) ×2 IMPLANT
SOLUTION ELECTROLUBE (MISCELLANEOUS) ×2 IMPLANT
SPONGE GAUZE 2X2 STER 10/PKG (GAUZE/BANDAGES/DRESSINGS) ×1
SUT ETHILON 3 0 PS 1 (SUTURE) ×2 IMPLANT
SUT VICRYL 0 UR6 27IN ABS (SUTURE) ×4 IMPLANT
SYR 27GX1/2 1ML LL SAFETY (SYRINGE) ×2 IMPLANT
TOWEL OR NON WOVEN STRL DISP B (DISPOSABLE) ×2 IMPLANT
WATER STERILE IRR 1500ML POUR (IV SOLUTION) IMPLANT

## 2013-07-16 NOTE — Interval H&P Note (Signed)
History and Physical Interval Note:  07/16/2013 7:16 AM  Cody Ellison  has presented today for surgery, with the diagnosis of PROSTATE CANCER  The various methods of treatment have been discussed with the patient and family. After consideration of risks, benefits and other options for treatment, the patient has consented to  Procedure(s): ROBOTIC ASSISTED LAPAROSCOPIC RADICAL PROSTATECTOMY LEVEL 2 (N/A) as a surgical intervention .  The patient's history has been reviewed, patient examined, no change in status, stable for surgery.  I have reviewed the patient's chart and labs.  Questions were answered to the patient's satisfaction.     Sawyer Kahan,LES

## 2013-07-16 NOTE — Progress Notes (Signed)
Day of Surgery Subjective: Patient reports tolerating PO and pain control good.  Denies N/V  Objective: Vital signs in last 24 hours: Temp:  [97.4 F (36.3 C)-97.8 F (36.6 C)] 97.6 F (36.4 C) (09/15 1300) Pulse Rate:  [62-81] 72 (09/15 1300) Resp:  [8-18] 16 (09/15 1300) BP: (156-180)/(73-90) 158/73 mmHg (09/15 1300) SpO2:  [97 %-100 %] 100 % (09/15 1300)  Intake/Output from previous day:   Intake/Output this shift: Total I/O In: 3000 [I.V.:2000; IV Piggyback:1000] Out: 295 [Urine:125; Drains:70; Blood:100]  Physical Exam:  General:alert, cooperative and no distress Lungs: BS clear GI: soft Incisions: dressings c/d/i Urine: clear Extremities: SCDs in place  Lab Results:  Recent Labs  07/16/13 1050  HGB 15.2  HCT 44.7    Studies/Results: No results found.  Assessment/Plan: Day of Surgery, Procedure(s) (LRB): ROBOTIC ASSISTED LAPAROSCOPIC RADICAL PROSTATECTOMY LEVEL 2, bilateral pelvic lymphadenectomy (N/A)  Ambulate, Incentive spirometry DVT prophylaxis Clears Pain control    LOS: 0 days   YARBROUGH,Aalayah Riles G. 07/16/2013, 2:15 PM

## 2013-07-16 NOTE — Op Note (Signed)
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 Robotic assisted laparoscopic radical prostatectomy (left nerve sparing) 2. Bilateral robotic assisted laparoscopic pelvic lymphadenectomy  Surgeon: Moody Bruins. M.D.  Assistant(s): Pecola Leisure, PA-C  Anesthesia: General  Complications: None  EBL: 100 mL  IVF:  1500 mL crystalloid  Specimens: 1. Prostate and seminal vesicles 2. Right pelvic lymph nodes 3. Left pelvic lymph nodes  Disposition of specimens: Pathology  Drains: 1. 20 Fr coude catheter 2. # 19 Blake pelvic drain  Indication: Cody Ellison is a 62 y.o. patient with clinically localized prostate cancer.  After a thorough review of the management options for treatment of prostate cancer, he elected to proceed with surgical therapy and the above procedure(s).  We have discussed the potential benefits and risks of the procedure, side effects of the proposed treatment, the likelihood of the patient achieving the goals of the procedure, and any potential problems that might occur during the procedure or recuperation. Informed consent has been obtained.  Description of procedure:  The patient was taken to the operating room and a general anesthetic was administered. He was given preoperative antibiotics, placed in the dorsal lithotomy position, and prepped and draped in the usual sterile fashion. Next a preoperative timeout was performed. A urethral catheter was placed into the bladder and a site was selected near the umbilicus for placement of the camera port. This was placed using a standard open Hassan technique which allowed entry into the peritoneal cavity under direct vision and without difficulty. A 12 mm port was placed and a pneumoperitoneum established. The camera was then used to inspect  the abdomen and there was no evidence of any intra-abdominal injuries or other abnormalities. The remaining abdominal ports were then placed. 8 mm robotic ports were placed in the right lower quadrant, left lower quadrant, and far left lateral abdominal wall. A 5 mm port was placed in the right upper quadrant and a 12 mm port was placed in the right lateral abdominal wall for laparoscopic assistance. All ports were placed under direct vision without difficulty. The surgical cart was then docked.   Utilizing the cautery scissors, the bladder was reflected posteriorly allowing entry into the space of Retzius and identification of the endopelvic fascia and prostate. The periprostatic fat was then removed from the prostate allowing full exposure of the endopelvic fascia. The endopelvic fascia was then incised from the apex back to the base of the prostate bilaterally and the underlying levator muscle fibers were swept laterally off the prostate thereby isolating the dorsal venous complex. The dorsal vein was then stapled and divided with a 45 mm Flex Echelon stapler. Attention then turned to the bladder neck which was divided anteriorly thereby allowing entry into the bladder and exposure of the urethral catheter. The catheter balloon was deflated and the catheter was brought into the operative field and used to retract the prostate anteriorly. The posterior bladder neck was then examined and was divided allowing further dissection between the bladder and prostate posteriorly until the vasa deferentia and seminal vessels were identified. The vasa deferentia were isolated, divided, and lifted anteriorly. The seminal vesicles were dissected down to their tips with care to control the seminal vascular arterial blood supply. These structures were then lifted anteriorly and the space between Denonvillier's fascia and the anterior rectum was developed with a combination of sharp and blunt dissection. This isolated  the  vascular pedicles of the prostate.  The lateral prostatic fascia on the left side of the prostate was then sharply incised allowing release of the neurovascular bundle. The vascular pedicle of the prostate on the left side was then ligated with Weck clips between the prostate and neurovascular bundle and divided with sharp cold scissor dissection resulting in neurovascular bundle preservation. On the right side, a wide non nerve sparing dissection was performed with Weck clips used to ligate the vascular pedicle of the prostate. The neurovascular bundle on the left side was then separated off the apex of the prostate and urethra.   The urethra was then sharply transected allowing the prostate specimen to be disarticulated. The pelvis was copiously irrigated and hemostasis was ensured. There was no evidence for rectal injury.  Attention then turned to the right pelvic sidewall. The fibrofatty tissue between the external iliac vein, confluence of the iliac vessels, hypogastric artery, and Cooper's ligament was dissected free from the pelvic sidewall with care to preserve the obturator nerve. Weck clips were used for lymphostasis and hemostasis. An identical procedure was performed on the contralateral side and the lymphatic packets were removed for permanent pathologic analysis.  Attention then turned to the urethral anastomosis. A 2-0 Vicryl slip knot was placed between Denonvillier's fascia, the posterior bladder neck, and the posterior urethra to reapproximate these structures. A double-armed 3-0 Monocryl suture was then used to perform a 360 running tension-free anastomosis between the bladder neck and urethra. A new urethral catheter was then placed into the bladder and irrigated. There were no blood clots within the bladder and the anastomosis appeared to be watertight. A #19 Blake drain was then brought through the left lateral 8 mm port site and positioned appropriately within the pelvis. It was  secured to the skin with a nylon suture. The surgical cart was then undocked. The right lateral 12 mm port site was closed at the fascial level with a 0 Vicryl suture placed laparoscopically. All remaining ports were then removed under direct vision. The prostate specimen was removed intact within the Endopouch retrieval bag via the periumbilical camera port site. This fascial opening was closed with two running 0 Vicryl sutures. 0.25% Marcaine was then injected into all port sites and all incisions were reapproximated at the skin level with staples. Sterile dressings were applied. The patient appeared to tolerate the procedure well and without complications. The patient was able to be extubated and transferred to the recovery unit in satisfactory condition.   Moody Bruins MD

## 2013-07-16 NOTE — Anesthesia Postprocedure Evaluation (Signed)
  Anesthesia Post-op Note  Patient: Cody Ellison  Procedure(s) Performed: Procedure(s) (LRB): ROBOTIC ASSISTED LAPAROSCOPIC RADICAL PROSTATECTOMY LEVEL 2, bilateral pelvic lymphadenectomy (N/A)  Patient Location: PACU  Anesthesia Type: General  Level of Consciousness: awake and alert   Airway and Oxygen Therapy: Patient Spontanous Breathing  Post-op Pain: mild  Post-op Assessment: Post-op Vital signs reviewed, Patient's Cardiovascular Status Stable, Respiratory Function Stable, Patent Airway and No signs of Nausea or vomiting  Last Vitals:  Filed Vitals:   07/16/13 1300  BP: 158/73  Pulse: 72  Temp: 36.4 C  Resp: 16    Post-op Vital Signs: stable   Complications: No apparent anesthesia complications

## 2013-07-16 NOTE — Transfer of Care (Signed)
Immediate Anesthesia Transfer of Care Note  Patient: Cody Ellison  Procedure(s) Performed: Procedure(s): ROBOTIC ASSISTED LAPAROSCOPIC RADICAL PROSTATECTOMY LEVEL 2, bilateral pelvic lymphadenectomy (N/A)  Patient Location: PACU  Anesthesia Type:General  Level of Consciousness: oriented, sedated and patient cooperative  Airway & Oxygen Therapy: Patient Spontanous Breathing and Patient connected to face mask oxygen  Post-op Assessment: Report given to PACU RN, Post -op Vital signs reviewed and stable and Patient moving all extremities  Post vital signs: Reviewed and stable  Complications: No apparent anesthesia complications

## 2013-07-17 ENCOUNTER — Encounter (HOSPITAL_COMMUNITY): Payer: Self-pay | Admitting: Urology

## 2013-07-17 MED ORDER — HYDROCODONE-ACETAMINOPHEN 5-325 MG PO TABS: 1.0000 | ORAL_TABLET | Freq: Four times a day (QID) | ORAL | Status: DC | PRN

## 2013-07-17 MED ORDER — BISACODYL 10 MG RE SUPP
10.0000 mg | Freq: Once | RECTAL | Status: AC
  Administered 2013-07-17: 08:00:00 10 mg via RECTAL
  Filled 2013-07-17: qty 1

## 2013-07-17 NOTE — Discharge Summary (Signed)
  Date of admission: 07/16/2013  Date of discharge: 07/17/2013  Admission diagnosis: Prostate Cancer  Discharge diagnosis: Prostate Cancer  History and Physical: For full details, please see admission history and physical. Briefly, Cody Ellison is a 62 y.o. gentleman with localized prostate cancer.  After discussing management/treatment options, he elected to proceed with surgical treatment.  Hospital Course: Cody Ellison was taken to the operating room on 07/16/2013 and underwent a robotic assisted laparoscopic radical prostatectomy. He tolerated this procedure well and without complications. Postoperatively, he was able to be transferred to a regular hospital room following recovery from anesthesia.  He was able to begin ambulating the night of surgery. He remained hemodynamically stable overnight.  He had excellent urine output with appropriately minimal output from his pelvic drain and his pelvic drain was removed on POD #1.  He was transitioned to oral pain medication, tolerated a clear liquid diet, and had met all discharge criteria and was able to be discharged home later on POD#1.  Laboratory values:  Recent Labs  07/16/13 1050 07/17/13 0500  HGB 15.2 13.7  HCT 44.7 41.0    Disposition: Home  Discharge instruction: He was instructed to be ambulatory but to refrain from heavy lifting, strenuous activity, or driving. He was instructed on urethral catheter care.  Discharge medications:     Medication List    STOP taking these medications       B-complex with vitamin C tablet     CALCIUM 600 + D PO     Co Q 10 100 MG Caps     vitamin E 400 UNIT capsule      TAKE these medications       acetaminophen 500 MG tablet  Commonly known as:  TYLENOL  Take 1,500 mg by mouth 2 (two) times daily.     aspirin EC 81 MG tablet  Take 81 mg by mouth daily.     atenolol 100 MG tablet  Commonly known as:  TENORMIN  Take 100 mg by mouth every morning.     ciprofloxacin 500 MG  tablet  Commonly known as:  CIPRO  Take 1 tablet (500 mg total) by mouth 2 (two) times daily. Start day prior to office visit for foley removal     felodipine 10 MG 24 hr tablet  Commonly known as:  PLENDIL  Take 10 mg by mouth every morning. Patient takes at nite     folic acid 800 MCG tablet  Commonly known as:  FOLVITE  Take 800 mcg by mouth daily.     HYDROcodone-acetaminophen 5-325 MG per tablet  Commonly known as:  NORCO  Take 1-2 tablets by mouth every 6 (six) hours as needed for pain.     lisinopril-hydrochlorothiazide 20-12.5 MG per tablet  Commonly known as:  PRINZIDE,ZESTORETIC  Take 1 tablet by mouth every morning.     niacin 500 MG tablet  Take 1,000 mg by mouth 2 (two) times daily with a meal.     omeprazole 20 MG capsule  Commonly known as:  PRILOSEC  Take 20 mg by mouth daily.     rosuvastatin 20 MG tablet  Commonly known as:  CRESTOR  Take 10 mg by mouth every evening.        Followup: He will followup in 1 week for catheter removal and to discuss his surgical pathology results.

## 2013-07-17 NOTE — Progress Notes (Signed)
Patient ID: Cody Ellison, male   DOB: [birth year], 62 y.o.   MRN: [id number]  1 Day Post-Op Subjective: The patient is doing well.  No nausea or vomiting. Pain is adequately controlled.  Objective: Vital signs in last 24 hours: Temp:  [97.4 F (36.3 C)-98.7 F (37.1 C)] 98.7 F (37.1 C) (09/16 0539) Pulse Rate:  [62-81] 74 (09/16 0539) Resp:  [8-20] 20 (09/16 0539) BP: (129-180)/(63-90) 153/65 mmHg (09/16 0539) SpO2:  [96 %-100 %] 97 % (09/16 0539) Weight:  [93.895 kg (207 lb)] 93.895 kg (207 lb) (09/15 1500)  Intake/Output from previous day: 09/15 0701 - 09/16 0700 In: 6345 [P.O.:470; I.V.:4775; IV Piggyback:1100] Out: 4445 [Urine:4050; Drains:295; Blood:100] Intake/Output this shift:    Physical Exam:  General: Alert and oriented. CV: RRR Lungs: Clear bilaterally. GI: Soft, Nondistended. Incisions: Dressings intact. Urine: Clear Extremities: Nontender, no erythema, no edema.  Lab Results:  Recent Labs  07/16/13 1050 07/17/13 0500  HGB 15.2 13.7  HCT 44.7 41.0      Assessment/Plan: POD# 1 s/p robotic prostatectomy.  1) SL IVF 2) Ambulate, Incentive spirometry 3) Transition to oral pain medication 4) Dulcolax suppository 5) D/C pelvic drain 6) Plan for likely discharge later today   Moody Bruins. MD   LOS: 1 day   Brynley Cuddeback,LES 07/17/2013, 7:27 AM

## 2013-07-17 NOTE — Care Management Note (Signed)
    Page 1 of 1   07/17/2013     12:17:04 PM   CARE MANAGEMENT NOTE 07/17/2013  Patient:  Cody Ellison, Cody Ellison   Account Number:  [id number]  Date Initiated:  07/17/2013  Documentation initiated by:  Lanier Clam  Subjective/Objective Assessment:   62 Y/O M ADMITTED W/PROSTATE CA.     Action/Plan:   FROM HOME.HAS PCP,PHARMACY.   Anticipated DC Date:  07/17/2013   Anticipated DC Plan:  HOME/SELF CARE      DC Planning Services  CM consult      Choice offered to / List presented to:             Status of service:  Completed, signed off Medicare Important Message given?   (If response is "NO", the following Medicare IM given date fields will be blank) Date Medicare IM given:   Date Additional Medicare IM given:    Discharge Disposition:  HOME/SELF CARE  Per UR Regulation:  Reviewed for med. necessity/level of care/duration of stay  If discussed at Long Length of Stay Meetings, dates discussed:    Comments:  07/17/13 Jlynn Ly RN,BSN NCM [phone] POD#1 PROSTATECTOMY.NO NEEDS OR ORDERS.

## 2013-07-18 ENCOUNTER — Encounter (HOSPITAL_COMMUNITY): Payer: Self-pay | Admitting: Urology

## 2014-05-09 DIAGNOSIS — Z9889 Other specified postprocedural states: Secondary | ICD-10-CM | POA: Insufficient documentation

## 2014-09-20 DIAGNOSIS — Z8546 Personal history of malignant neoplasm of prostate: Secondary | ICD-10-CM | POA: Insufficient documentation

## 2015-11-20 ENCOUNTER — Ambulatory Visit
Admission: RE | Admit: 2015-11-20 | Discharge: 2015-11-20 | Disposition: A | Discharge status: Home / Self Care | Payer: BLUE CROSS/BLUE SHIELD | Source: Ambulatory Visit | Attending: Radiation Oncology | Admitting: Radiation Oncology

## 2015-11-20 ENCOUNTER — Encounter: Payer: Self-pay | Admitting: Radiation Oncology

## 2015-11-20 ENCOUNTER — Other Ambulatory Visit: Payer: Self-pay | Admitting: *Deleted

## 2015-11-20 VITALS — BP 151/86 | HR 73 | Temp 97.3°F | Resp 20 | Wt 212.5 lb

## 2015-11-20 DIAGNOSIS — C61 Malignant neoplasm of prostate: Secondary | ICD-10-CM

## 2015-11-20 DIAGNOSIS — Z510 Encounter for antineoplastic radiation therapy: Secondary | ICD-10-CM | POA: Insufficient documentation

## 2015-11-20 NOTE — Consult Note (Signed)
Except an outstanding is perfect of Radiation Oncology NEW PATIENT EVALUATION  Name: Cody ABRAHAMS Sr.  MRN: [id number]  Date:   11/20/2015     DOB: 1951-07-29   This 65 y.o. male patient presents to the clinic for initial evaluation of surgical stage IIIa (T3a N0 M0 Gleason 7 (4+3) adenocarcinoma prostate status post robotic prostatectomy in 2014 now with rising PSA  REFERRING PHYSICIAN: [name], MD  CHIEF COMPLAINT:  Chief Complaint  Patient presents with  . Prostate Cancer    Pt is here for initial consultation of prostate cancer.      DIAGNOSIS: The encounter diagnosis was Malignant neoplasm of prostate ([place]).   PREVIOUS INVESTIGATIONS:  Bone scan has been ordered Surgical pathology report reviewed Clinical notes reviewed  HPI: Patient is a pleasant 65 year old male presented with a slightly rising PSA in the 4.6 range back in 2014. Underwent robotic-assisted left scalp radical prostatectomy and bilateral pelvic lymph node sampling at that time showing 10 of 12 cores positive for adenocarcinoma. Gleason score was a mixture of Gleason 7 (4+3) and Gleason 6 (3+3). Surgery was performed back 07/16/2013. The was extracapsular involvement by tumor margins not involved some vesicles were clear. 8 pelvic lymph nodes were resected all negative for metastatic disease. There was lymphovascular invasion present. His PSA was initially undetectable for about 2 years started becoming a parent to rise in March 2016 and most recently was 0.25 in January 2017. He is seen today for consideration of salvage radiation therapy. He is doing well he specifically denies urinary incontinence does not wear depends undergarment. No problems with his bowels or other lower urinary tract symptoms. He has no nocturia. He also specifically denies any bone pain.  PLANNED TREATMENT REGIMEN: Salvage radiation therapy  PAST MEDICAL HISTORY:  has a past medical history of Hypertension; GERD (gastroesophageal  reflux disease); Cancer ([place]); Arthritis; and Coronary artery disease.    PAST SURGICAL HISTORY:  Past Surgical History  Procedure Laterality Date  . Coronary angioplasty    . Coronary stents       x 2  . Appendectomy    . Tonsillectomy      and adenoidectomy   . Robot assisted laparoscopic radical prostatectomy N/A 07/16/2013    Procedure: ROBOTIC ASSISTED LAPAROSCOPIC RADICAL PROSTATECTOMY LEVEL 2, bilateral pelvic lymphadenectomy;  Surgeon: [name], MD;  Location: [facility];  Service: Urology;  Laterality: N/A;    FAMILY HISTORY: family history is not on file.  SOCIAL HISTORY:  reports that he quit smoking about 23 years ago. He has never used smokeless tobacco. He reports that he does not drink alcohol or use illicit drugs.  ALLERGIES: Ampicillin; Atorvastatin; Contrast media; Diltiazem hcl; Fluvastatin; Lovastatin; Sulfa antibiotics; and Betadine  MEDICATIONS:  Current Outpatient Prescriptions  Medication Sig Dispense Refill  . acetaminophen (TYLENOL) 500 MG tablet Take 1,500 mg by mouth 2 (two) times daily.    [name] aspirin EC 81 MG tablet Take 81 mg by mouth daily.    [name] atenolol (TENORMIN) 100 MG tablet Take 100 mg by mouth every morning.    . Calcium Carbonate-Vit D-Min (CALTRATE 600+D PLUS PO) Take by mouth.    . felodipine (PLENDIL) 10 MG 24 hr tablet Take 10 mg by mouth every morning. Patient takes at nite    . folic acid (FOLVITE) [id number] MCG tablet Take 800 mcg by mouth daily.    [name] lisinopril-hydrochlorothiazide (PRINZIDE,ZESTORETIC) 20-12.5 MG per tablet Take 1 tablet by mouth every morning.    . niacin  500 MG tablet Take 1,000 mg by mouth 2 (two) times daily with a meal.    . omeprazole (PRILOSEC) 20 MG capsule Take 20 mg by mouth daily.    . simvastatin (ZOCOR) 40 MG tablet Take 40 mg by mouth at bedtime.  3   No current facility-administered medications for this encounter.    ECOG PERFORMANCE STATUS:  0 - Asymptomatic  REVIEW OF SYSTEMS:  Patient denies any weight  loss, fatigue, weakness, fever, chills or night sweats. Patient denies any loss of vision, blurred vision. Patient denies any ringing  of the ears or hearing loss. No irregular heartbeat. Patient denies heart murmur or history of fainting. Patient denies any chest pain or pain radiating to her upper extremities. Patient denies any shortness of breath, difficulty breathing at night, cough or hemoptysis. Patient denies any swelling in the lower legs. Patient denies any nausea vomiting, vomiting of blood, or coffee ground material in the vomitus. Patient denies any stomach pain. Patient states has had normal bowel movements no significant constipation or diarrhea. Patient denies any dysuria, hematuria or significant nocturia. Patient denies any problems walking, swelling in the joints or loss of balance. Patient denies any skin changes, loss of hair or loss of weight. Patient denies any excessive worrying or anxiety or significant depression. Patient denies any problems with insomnia. Patient denies excessive thirst, polyuria, polydipsia. Patient denies any swollen glands, patient denies easy bruising or easy bleeding. Patient denies any recent infections, allergies or URI. Patient "s visual fields have not changed significantly in recent time.    PHYSICAL EXAM: BP 151/86 mmHg  Pulse 73  Temp(Src) 97.3 F (36.3 C)  Resp 20  Wt 212 lb 8.4 oz (96.4 kg) On rectal exam rectal sphincter tone is good prostatic fossa is clear without evidence of nodularity or mass. No other evidence of other rectal abnormalities identified. Well-developed well-nourished patient in NAD. HEENT reveals PERLA, EOMI, discs not visualized.  Oral cavity is clear. No oral mucosal lesions are identified. Neck is clear without evidence of cervical or supraclavicular adenopathy. Lungs are clear to A&P. Cardiac examination is essentially unremarkable with regular rate and rhythm without murmur rub or thrill. Abdomen is benign with no  organomegaly or masses noted. Motor sensory and DTR levels are equal and symmetric in the upper and lower extremities. Cranial nerves II through XII are grossly intact. Proprioception is intact. No peripheral adenopathy or edema is identified. No motor or sensory levels are noted. Crude visual fields are within normal range.  LABORATORY DATA: Surgical pathology report reviewed    RADIOLOGY RESULTS: Bone scan has been ordered as a baseline study   IMPRESSION: Stage III (T3a N0 M0) Gleason 7 (4+3) adenocarcinoma prostate status post robotic prostatectomy in 2014 now with rising PSA  PLAN: At this time of ordered a bone scan is a baseline study. I have performed a [facility] nomogram based on his original tumor parameters which show and 18% chance of lymph node involvement 70 sevenths percent chance of extracapsular extension and progression free probability at 5 years at 62%. Based on those findings and his recent rising PSA I would recommend salvage radiation therapy to his prostatic fossa as well as his pelvic lymph nodes. I would use I MRT treatment planning and delivery to treat up to 7600 cGy to his prostatic fossa treating his pelvic lymph nodes to 5400 cGy using I am RT dose painting technique. I have dose escalated in the past and comparable to recent dose escalation studies believe  7600 cGy using I am RT treatment planning and delivery is safe and effective increases her chances of local tumor recurrence control. Patient and wife both compress my treatment plan well I have set up and ordered CT simulation after his bone scan.  I would like to take this opportunity for allowing me to participate in the care of your patient.Armstead Peaks., MD

## 2015-11-26 ENCOUNTER — Encounter
Admission: RE | Admit: 2015-11-26 | Discharge: 2015-11-26 | Disposition: A | Discharge status: Home / Self Care | Payer: BLUE CROSS/BLUE SHIELD | Source: Ambulatory Visit | Attending: Radiation Oncology | Admitting: Radiation Oncology

## 2015-11-26 ENCOUNTER — Other Ambulatory Visit: Payer: Self-pay | Admitting: *Deleted

## 2015-11-26 DIAGNOSIS — C61 Malignant neoplasm of prostate: Secondary | ICD-10-CM | POA: Diagnosis not present

## 2015-11-26 IMAGING — NM NM BONE WHOLE BODY
2 series · 8 of 8 positions shown · non-contrast
Comparison: None in PACs

CLINICAL DATA: History of prostate malignancy treated with surgery
2 years ago, rising PSA. No skeletal symptoms or recent trauma

EXAM:
NUCLEAR MEDICINE WHOLE BODY BONE SCAN
TECHNIQUE: Whole body anterior and posterior images were obtained approximately
3 hours after intravenous injection of radiopharmaceutical.
RADIOPHARMACEUTICALS:  22.68 mCi 0echnetium-MMm MDP IV

[Series 1000: 3 hr wholebody · 2.40mm/px · 2 of 2 frames shown]
[frame 1/2]
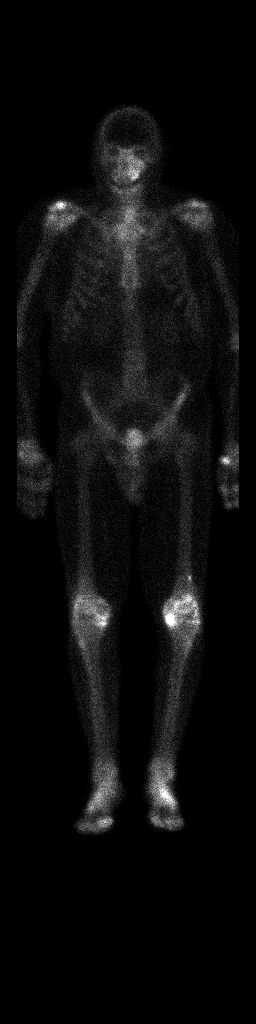
[frame 2/2]
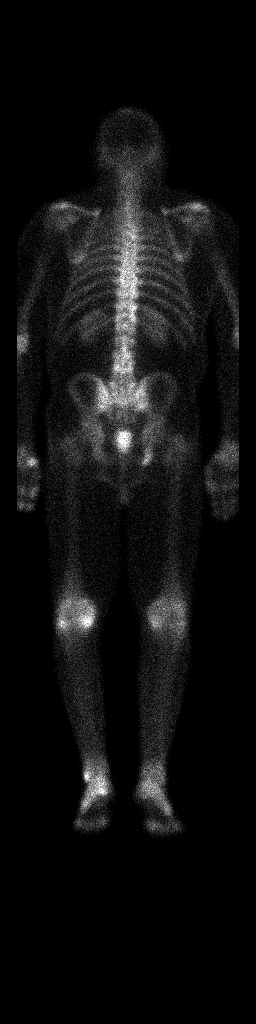

[Series 1000: statics · 2.40mm/px · 3 acquisitions, 6 frames shown]
[im 1/3]
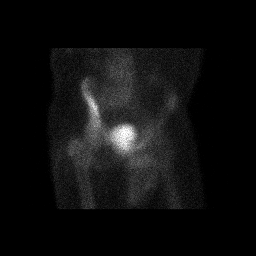
[im 1/3]
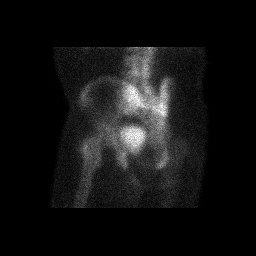
[im 2/3]
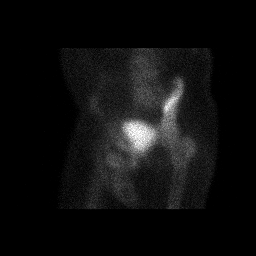
[im 2/3]
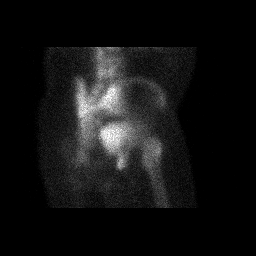
[im 3/3]
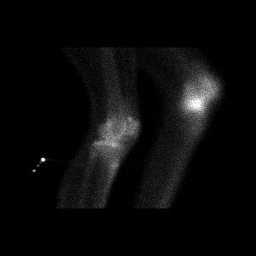
[im 3/3]
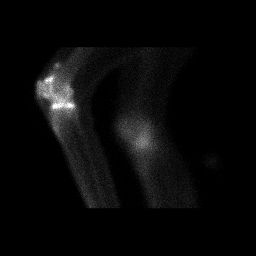

[8 of 8 positions shown; findings below may reference images not displayed]

FINDINGS: There is adequate uptake of the radiopharmaceutical by the skeleton.
There is adequate soft tissue clearance and renal activity.

Uptake within the calvarium is normal. Increased uptake in the left
maxilla likely reflects periodontal disease. Activity within the
spine and ribs is within the limits of normal for age. Uptake within
the upper extremities is not suspicious for metastatic disease.
Degenerative change of the left first carpometacarpal joint is
suspected.

Within the pelvis there is mildly increased uptake in the right
inferior pubic ramus. There is increased uptake about both knees
most compatible with degenerative change. There is a tiny focus of
increased uptake along the lateral cortical surface of the distal
femoral meta diaphysis on the left. Increased uptake in the ankles
and feet likely reflects degenerative change.
IMPRESSION: 1. Increased uptake within the right inferior pubic ramus and a
punctate focus of increased uptake in the lateral cortex of the
distal left femoral meta diaphysis could reflect metastatic disease.
Plain films of the pelvis and of the distal left femur are
recommended.
2. Increased uptake elsewhere as described is felt be most
compatible with degenerative change.

## 2015-11-26 MED ORDER — TECHNETIUM TC 99M MEDRONATE IV KIT
22.6800 | PACK | Freq: Once | INTRAVENOUS | Status: AC | PRN
  Administered 2015-11-26: 22.68 via INTRAVENOUS

## 2015-11-27 ENCOUNTER — Ambulatory Visit
Admission: RE | Admit: 2015-11-27 | Discharge: 2015-11-27 | Disposition: A | Discharge status: Home / Self Care | Payer: BLUE CROSS/BLUE SHIELD | Source: Ambulatory Visit | Attending: Radiation Oncology | Admitting: Radiation Oncology

## 2015-11-27 ENCOUNTER — Other Ambulatory Visit: Payer: Self-pay | Admitting: Radiation Oncology

## 2015-11-27 ENCOUNTER — Other Ambulatory Visit: Payer: Self-pay | Admitting: *Deleted

## 2015-11-27 DIAGNOSIS — Z9079 Acquired absence of other genital organ(s): Secondary | ICD-10-CM | POA: Diagnosis not present

## 2015-11-27 DIAGNOSIS — C61 Malignant neoplasm of prostate: Secondary | ICD-10-CM

## 2015-11-27 DIAGNOSIS — M1712 Unilateral primary osteoarthritis, left knee: Secondary | ICD-10-CM | POA: Insufficient documentation

## 2015-11-27 DIAGNOSIS — Z8546 Personal history of malignant neoplasm of prostate: Secondary | ICD-10-CM | POA: Insufficient documentation

## 2015-11-27 IMAGING — CR DG FEMUR 2+V*L*
1 series · 1 of 1 positions shown · non-contrast
Comparison: 11/26/2015

CLINICAL DATA: Follow-up abnormal area seen on bone scan.

EXAM:
LEFT FEMUR 2 VIEWS

[dg hip unilat w or w/o pelvis 2-3 views ]
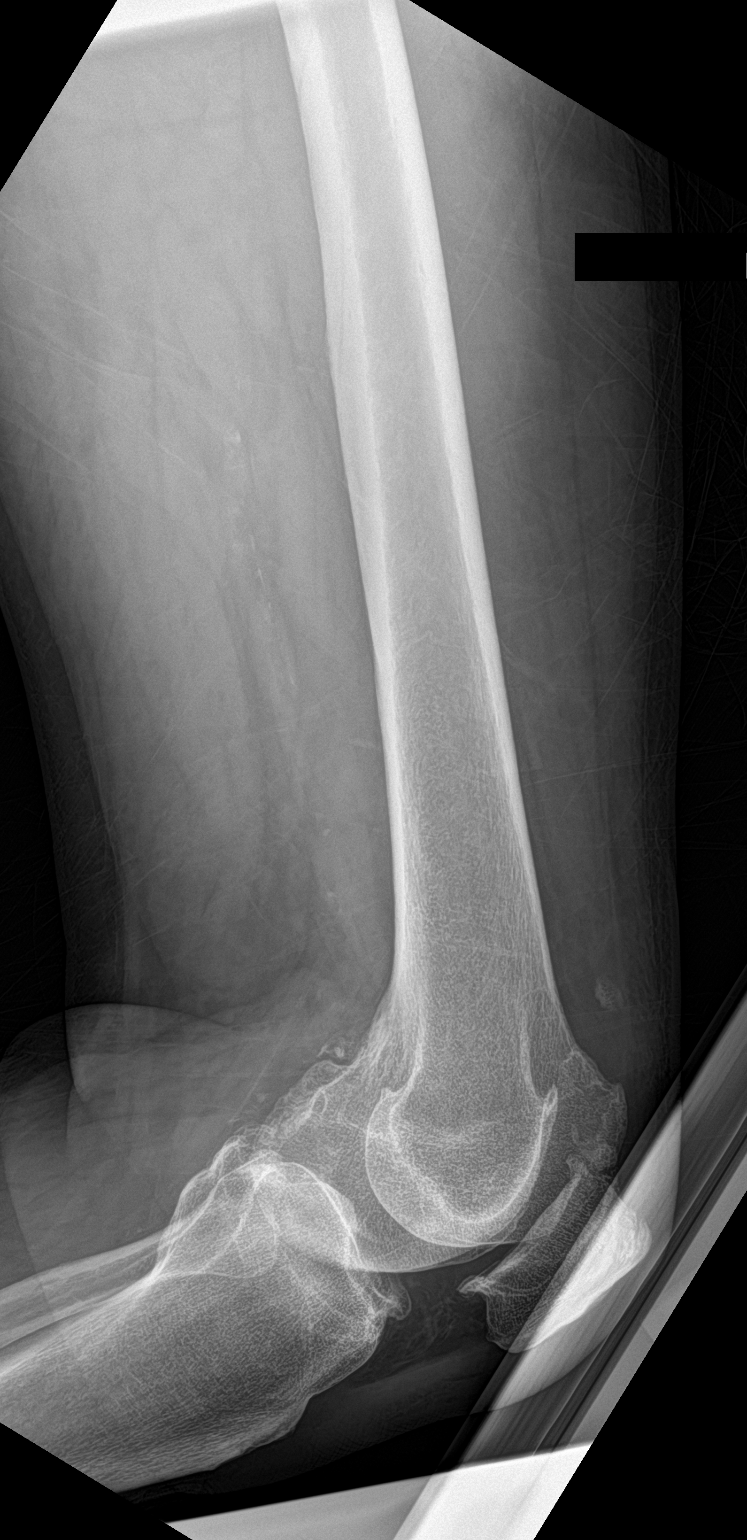

[1 of 1 positions shown; findings below may reference images not displayed]

FINDINGS: There is significant tricompartmental degenerative change seen
within the knee. A joint effusion is present. A small ossific
density is identified superior to the patella along the anterior
aspect of the distal femur. This measures approximately 1.0 cm and
likely accounts for the abnormality seen on bone scan. This bone
fragment may represent a loose body in the suprapatellar bursa or
calcification within the suprapatellar plica. Findings are not
consistent with acute fracture. There is moderate calcification of
the popliteal artery.
IMPRESSION: 1. Significant tricompartmental degenerative change of the knee.
2. Small suprapatellar calcification likely accounts for the bone
scan abnormality.
3. No plain film evidence for metastatic disease of the knee.

## 2015-11-27 IMAGING — CR DG FEMUR 2+V*L*
1 series · 1 of 1 positions shown · non-contrast
Comparison: 11/26/2015

CLINICAL DATA: Follow-up abnormal area seen on bone scan.

EXAM:
LEFT FEMUR 2 VIEWS

[dg hip unilat w or w/o pelvis 2-3 views ]
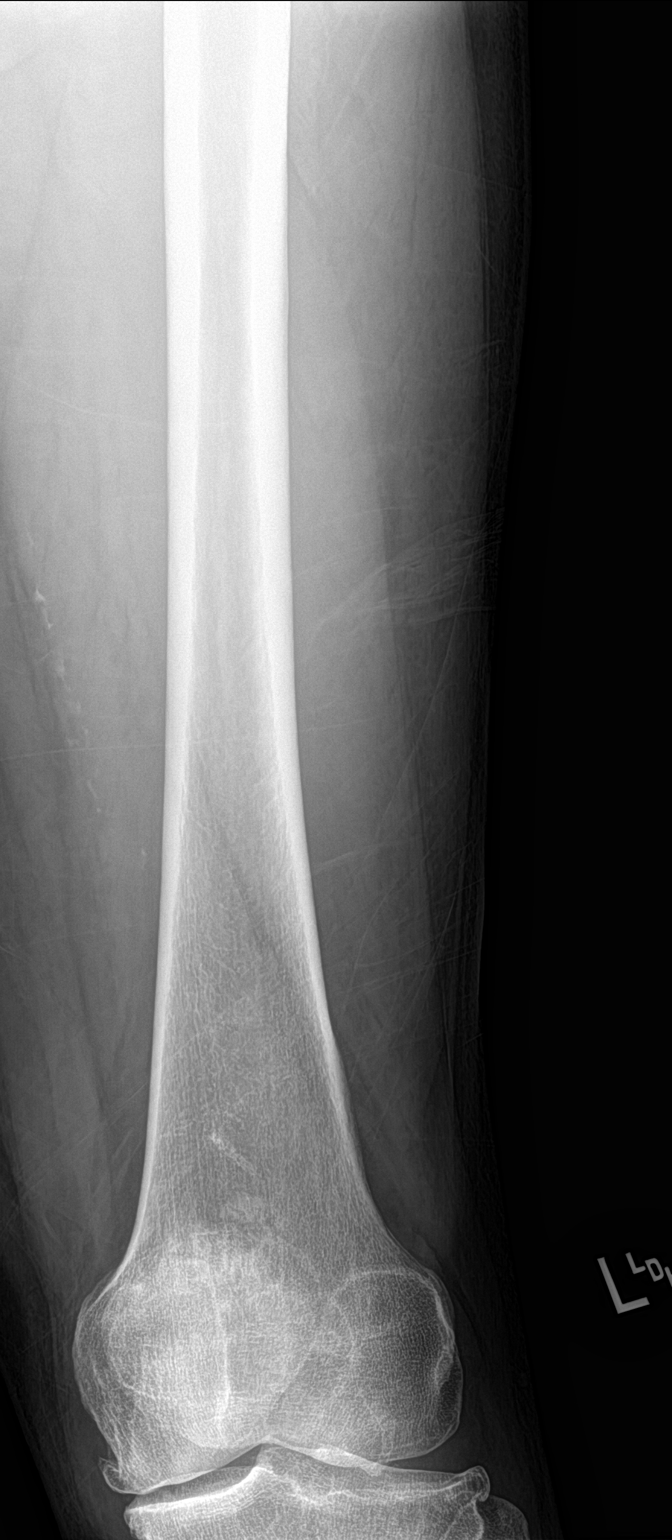

[1 of 1 positions shown; findings below may reference images not displayed]

FINDINGS: There is significant tricompartmental degenerative change seen
within the knee. A joint effusion is present. A small ossific
density is identified superior to the patella along the anterior
aspect of the distal femur. This measures approximately 1.0 cm and
likely accounts for the abnormality seen on bone scan. This bone
fragment may represent a loose body in the suprapatellar bursa or
calcification within the suprapatellar plica. Findings are not
consistent with acute fracture. There is moderate calcification of
the popliteal artery.
IMPRESSION: 1. Significant tricompartmental degenerative change of the knee.
2. Small suprapatellar calcification likely accounts for the bone
scan abnormality.
3. No plain film evidence for metastatic disease of the knee.

## 2015-11-27 IMAGING — CR DG FEMUR 2+V*L*
1 series · 1 of 1 positions shown · non-contrast
Comparison: 11/26/2015

CLINICAL DATA: Follow-up abnormal area seen on bone scan.

EXAM:
LEFT FEMUR 2 VIEWS

[dg hip unilat w or w/o pelvis 2-3 views ]
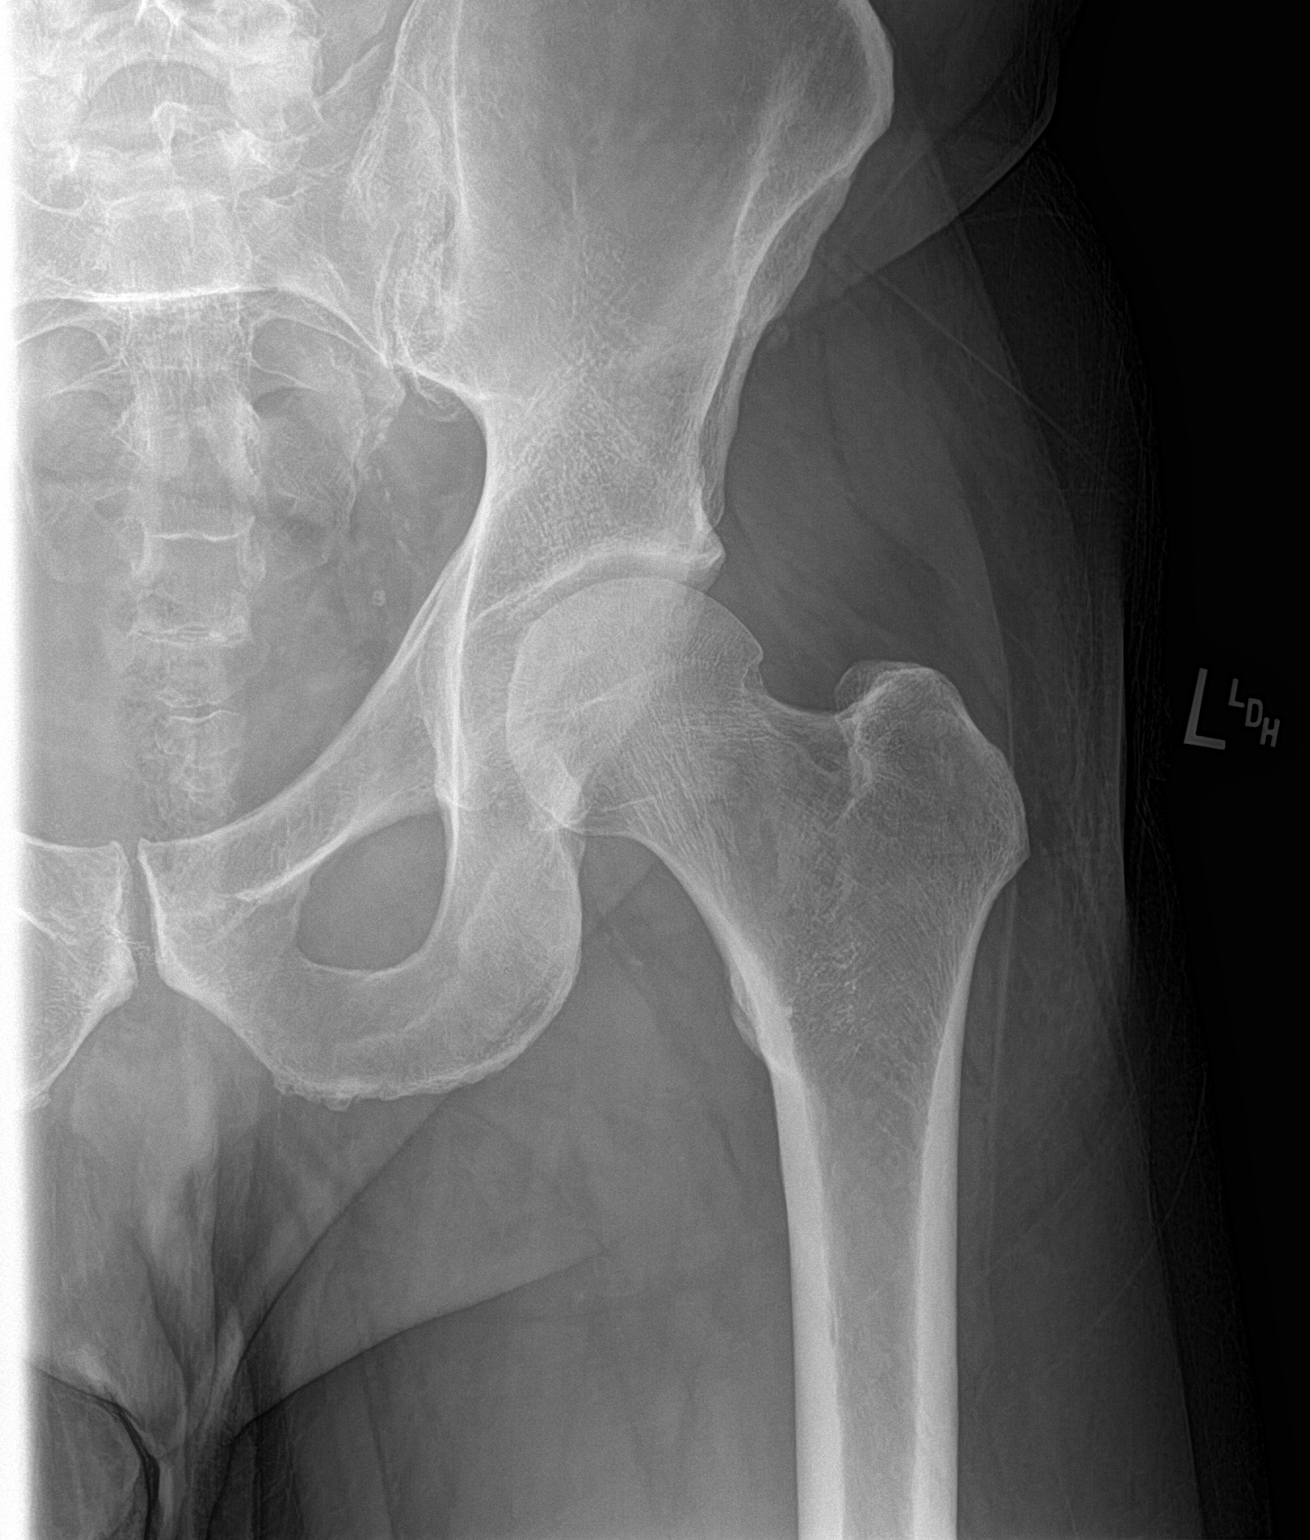

[1 of 1 positions shown; findings below may reference images not displayed]

FINDINGS: There is significant tricompartmental degenerative change seen
within the knee. A joint effusion is present. A small ossific
density is identified superior to the patella along the anterior
aspect of the distal femur. This measures approximately 1.0 cm and
likely accounts for the abnormality seen on bone scan. This bone
fragment may represent a loose body in the suprapatellar bursa or
calcification within the suprapatellar plica. Findings are not
consistent with acute fracture. There is moderate calcification of
the popliteal artery.
IMPRESSION: 1. Significant tricompartmental degenerative change of the knee.
2. Small suprapatellar calcification likely accounts for the bone
scan abnormality.
3. No plain film evidence for metastatic disease of the knee.

## 2015-11-27 IMAGING — CR DG PELVIS 1-2V
1 series · 6 of 6 positions shown · non-contrast
Comparison: 11/26/2015 and 06/13/2013

CLINICAL DATA: Prostate adenocarcinoma, prostatectomy in 0228, now
with rising PSA. Bone scan showed focal increased activity in the
distal left femur and along the right ischial tuberosity

EXAM:
PELVIS - 1-2 VIEW

[Series 1: dg hip unilat w or w/o pelvis 2-3 views  · non-contrast · 0.14mm/px · 6 of 6 slices shown]
[im 1/6]
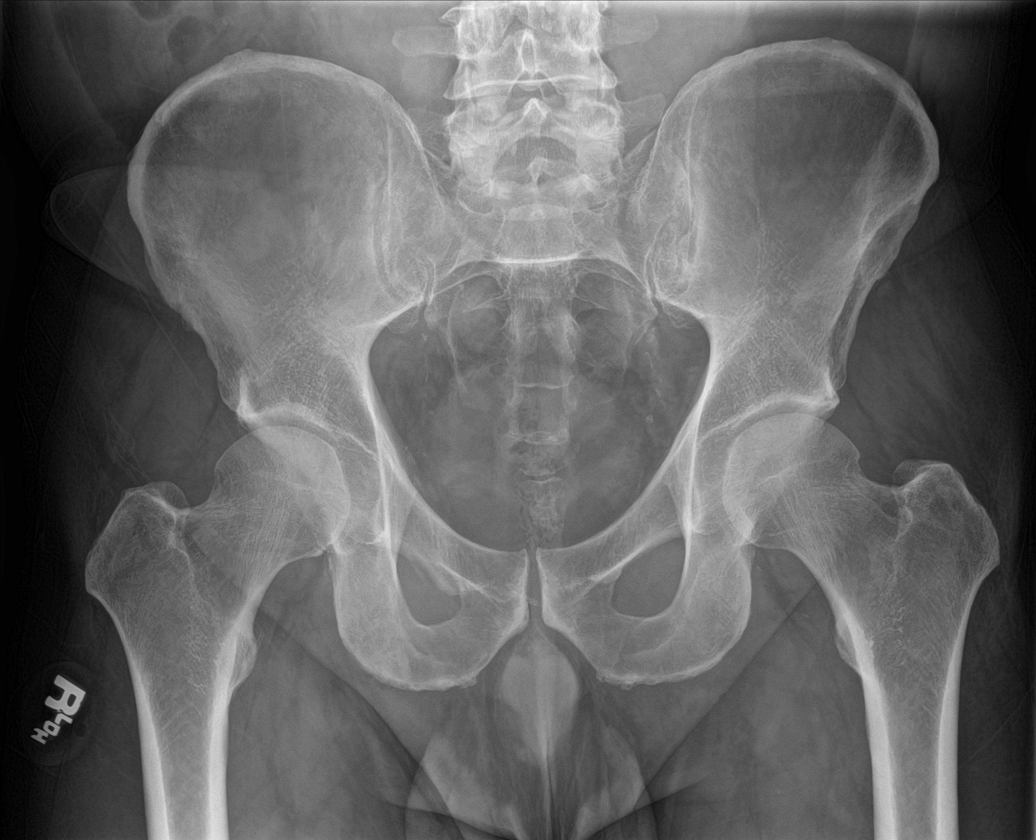
[im 2/6]
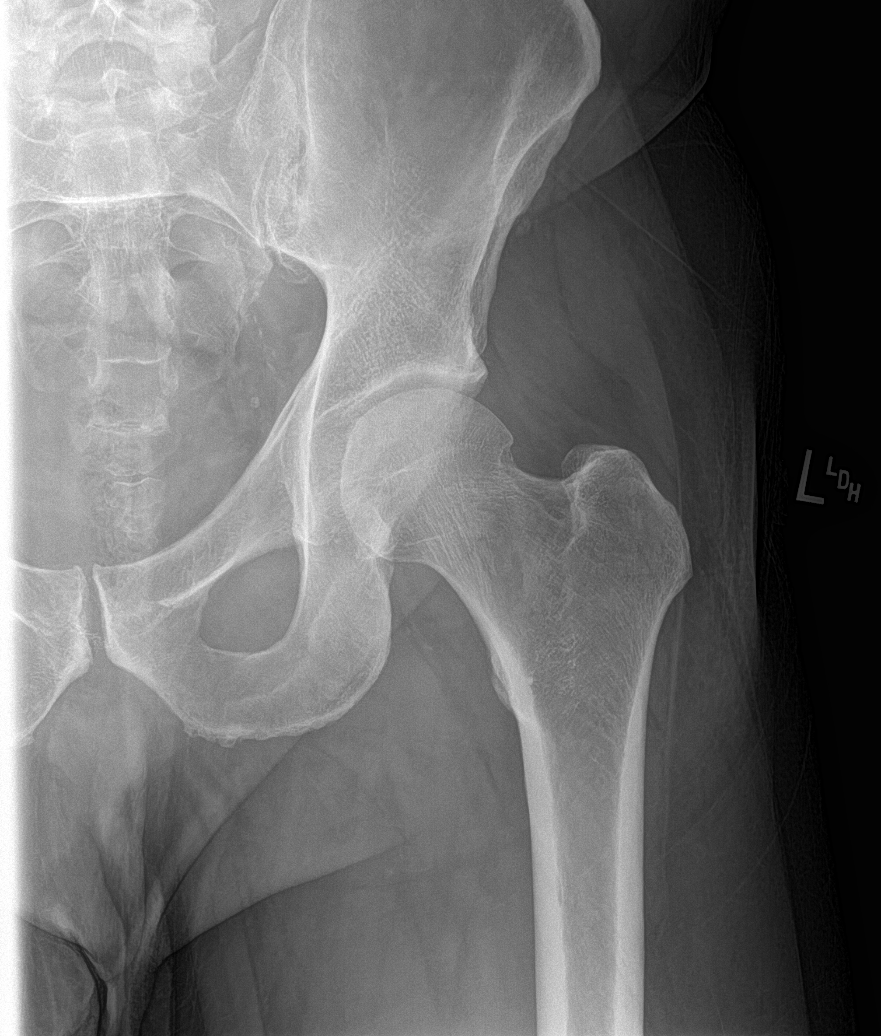
[im 3/6]
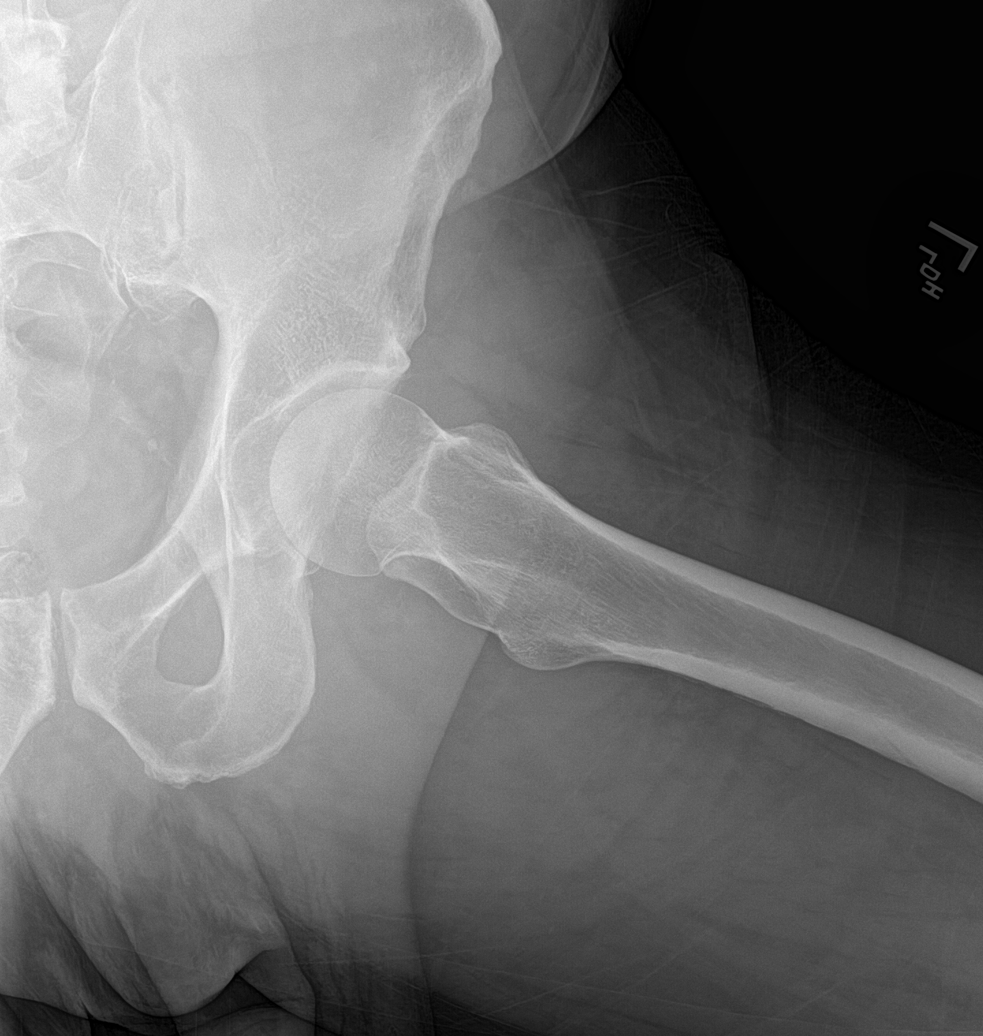
[im 4/6]
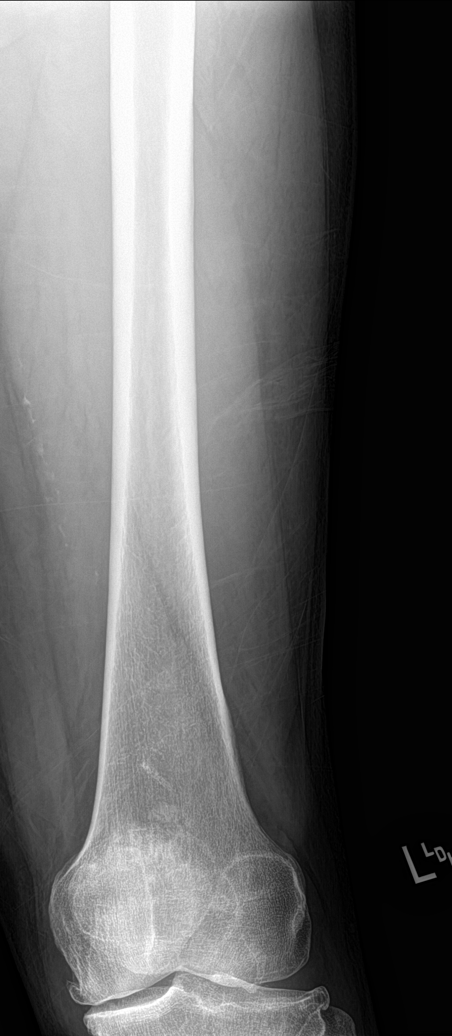
[im 5/6]
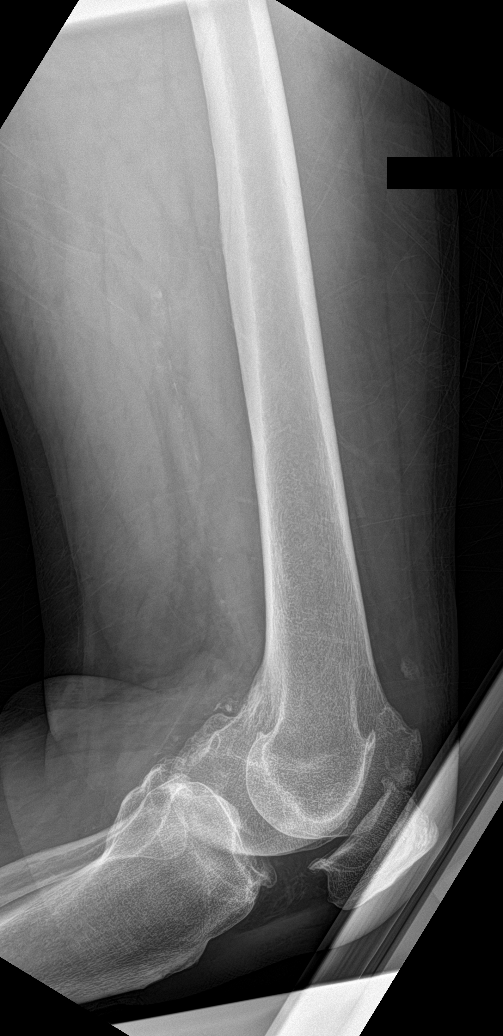
[im 6/6]
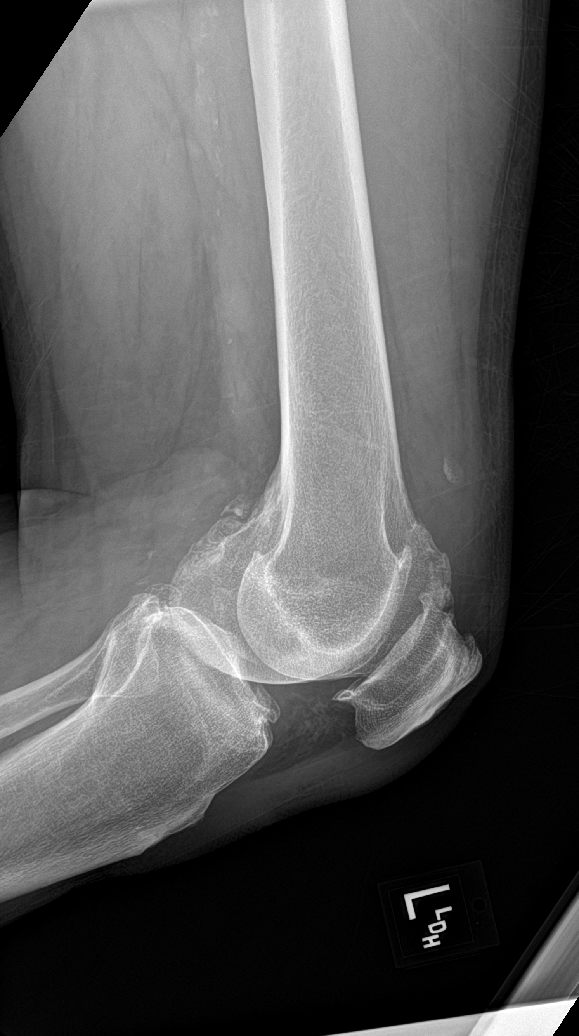

[6 of 6 positions shown; findings below may reference images not displayed]

FINDINGS: There is asymmetric abnormal sclerosis in the right ischium. No
obvious avulsion. I do not observe this sclerosis to be present on
the prior CT pelvis from 06/13/2013.
IMPRESSION: 1. Faint but asymmetric abnormal sclerosis in the right ischium, new
from 06/13/2013, differential diagnosis includes metastatic disease
from prostate cancer, and reactive sclerosis from hamstring
tendinopathy. MRI bony pelvis with and without contrast could help
differentiate, if clinically warranted.

## 2015-11-27 IMAGING — CR DG FEMUR 2+V*L*
1 series · 1 of 1 positions shown · non-contrast
Comparison: 11/26/2015

CLINICAL DATA: Follow-up abnormal area seen on bone scan.

EXAM:
LEFT FEMUR 2 VIEWS

[dg hip unilat w or w/o pelvis 2-3 views ]
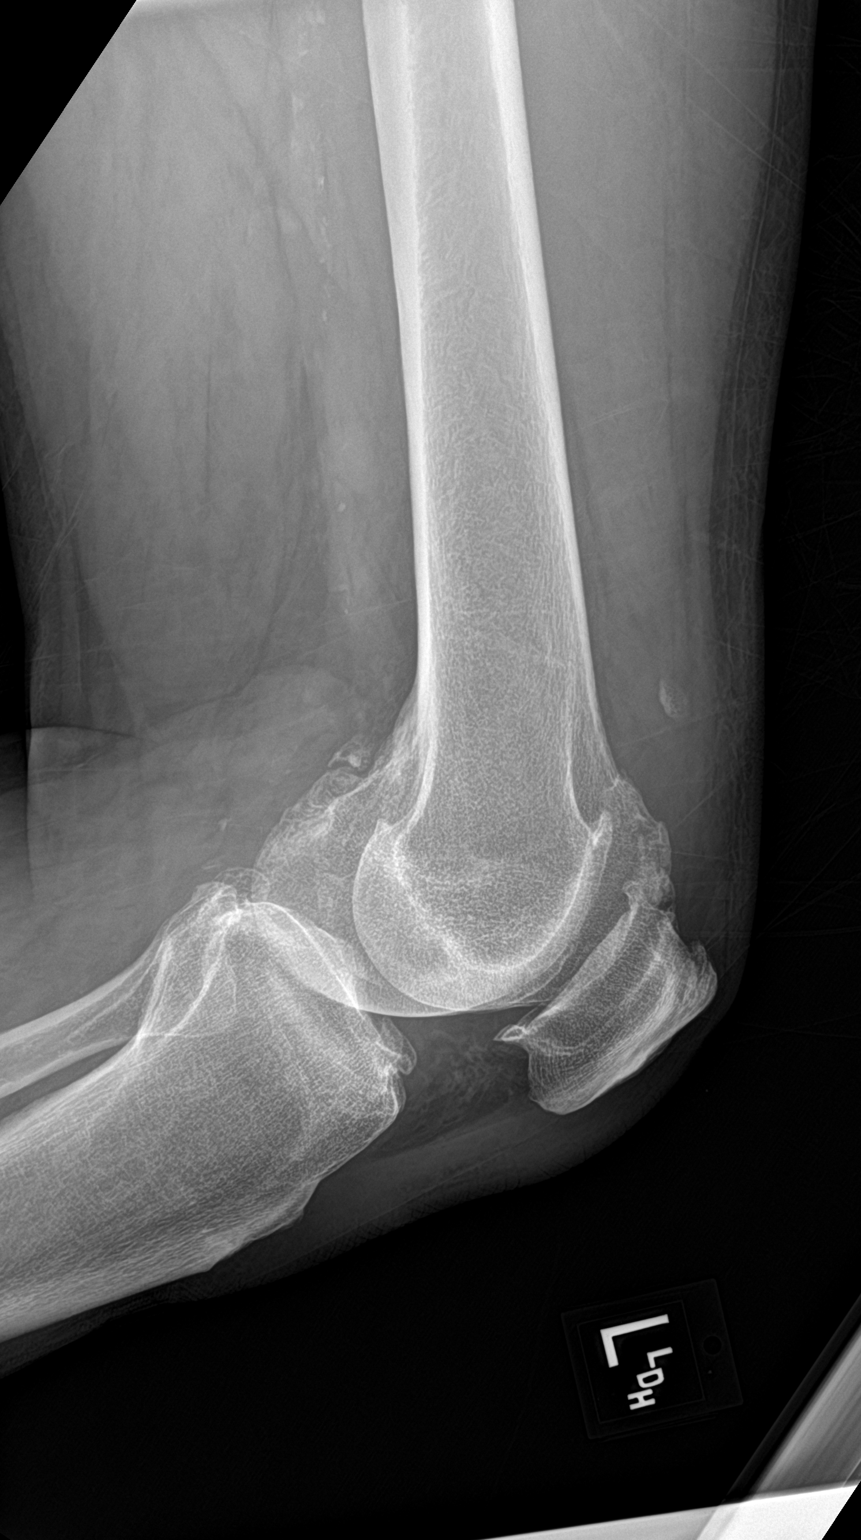

[1 of 1 positions shown; findings below may reference images not displayed]

FINDINGS: There is significant tricompartmental degenerative change seen
within the knee. A joint effusion is present. A small ossific
density is identified superior to the patella along the anterior
aspect of the distal femur. This measures approximately 1.0 cm and
likely accounts for the abnormality seen on bone scan. This bone
fragment may represent a loose body in the suprapatellar bursa or
calcification within the suprapatellar plica. Findings are not
consistent with acute fracture. There is moderate calcification of
the popliteal artery.
IMPRESSION: 1. Significant tricompartmental degenerative change of the knee.
2. Small suprapatellar calcification likely accounts for the bone
scan abnormality.
3. No plain film evidence for metastatic disease of the knee.

## 2015-12-01 ENCOUNTER — Ambulatory Visit
Admission: RE | Admit: 2015-12-01 | Discharge: 2015-12-01 | Disposition: A | Discharge status: Home / Self Care | Payer: BLUE CROSS/BLUE SHIELD | Source: Ambulatory Visit | Attending: Radiation Oncology | Admitting: Radiation Oncology

## 2015-12-01 DIAGNOSIS — Z510 Encounter for antineoplastic radiation therapy: Secondary | ICD-10-CM | POA: Diagnosis not present

## 2015-12-01 DIAGNOSIS — C61 Malignant neoplasm of prostate: Secondary | ICD-10-CM | POA: Diagnosis not present

## 2015-12-02 DIAGNOSIS — C61 Malignant neoplasm of prostate: Secondary | ICD-10-CM | POA: Diagnosis not present

## 2015-12-05 ENCOUNTER — Other Ambulatory Visit: Payer: Self-pay | Admitting: *Deleted

## 2015-12-05 DIAGNOSIS — C61 Malignant neoplasm of prostate: Secondary | ICD-10-CM

## 2015-12-09 DIAGNOSIS — C61 Malignant neoplasm of prostate: Secondary | ICD-10-CM | POA: Diagnosis not present

## 2015-12-10 ENCOUNTER — Ambulatory Visit
Admission: RE | Admit: 2015-12-10 | Discharge: 2015-12-10 | Disposition: A | Discharge status: Home / Self Care | Payer: BLUE CROSS/BLUE SHIELD | Source: Ambulatory Visit | Attending: Radiation Oncology | Admitting: Radiation Oncology

## 2015-12-11 ENCOUNTER — Ambulatory Visit
Admission: RE | Admit: 2015-12-11 | Discharge: 2015-12-11 | Disposition: A | Discharge status: Home / Self Care | Payer: BLUE CROSS/BLUE SHIELD | Source: Ambulatory Visit | Attending: Radiation Oncology | Admitting: Radiation Oncology

## 2015-12-11 DIAGNOSIS — C61 Malignant neoplasm of prostate: Secondary | ICD-10-CM | POA: Diagnosis not present

## 2015-12-12 ENCOUNTER — Ambulatory Visit
Admission: RE | Admit: 2015-12-12 | Discharge: 2015-12-12 | Disposition: A | Discharge status: Home / Self Care | Payer: BLUE CROSS/BLUE SHIELD | Source: Ambulatory Visit | Attending: Radiation Oncology | Admitting: Radiation Oncology

## 2015-12-12 DIAGNOSIS — C61 Malignant neoplasm of prostate: Secondary | ICD-10-CM | POA: Diagnosis not present

## 2015-12-15 ENCOUNTER — Ambulatory Visit
Admission: RE | Admit: 2015-12-15 | Discharge: 2015-12-15 | Disposition: A | Discharge status: Home / Self Care | Payer: BLUE CROSS/BLUE SHIELD | Source: Ambulatory Visit | Attending: Radiation Oncology | Admitting: Radiation Oncology

## 2015-12-15 DIAGNOSIS — C61 Malignant neoplasm of prostate: Secondary | ICD-10-CM | POA: Diagnosis not present

## 2015-12-16 ENCOUNTER — Ambulatory Visit
Admission: RE | Admit: 2015-12-16 | Discharge: 2015-12-16 | Disposition: A | Discharge status: Home / Self Care | Payer: BLUE CROSS/BLUE SHIELD | Source: Ambulatory Visit | Attending: Radiation Oncology | Admitting: Radiation Oncology

## 2015-12-16 DIAGNOSIS — C61 Malignant neoplasm of prostate: Secondary | ICD-10-CM | POA: Diagnosis not present

## 2015-12-17 ENCOUNTER — Ambulatory Visit
Admission: RE | Admit: 2015-12-17 | Discharge: 2015-12-17 | Disposition: A | Discharge status: Home / Self Care | Payer: BLUE CROSS/BLUE SHIELD | Source: Ambulatory Visit | Attending: Radiation Oncology | Admitting: Radiation Oncology

## 2015-12-17 DIAGNOSIS — C61 Malignant neoplasm of prostate: Secondary | ICD-10-CM | POA: Diagnosis not present

## 2015-12-18 ENCOUNTER — Ambulatory Visit
Admission: RE | Admit: 2015-12-18 | Discharge: 2015-12-18 | Disposition: A | Discharge status: Home / Self Care | Payer: BLUE CROSS/BLUE SHIELD | Source: Ambulatory Visit | Attending: Radiation Oncology | Admitting: Radiation Oncology

## 2015-12-18 DIAGNOSIS — C61 Malignant neoplasm of prostate: Secondary | ICD-10-CM | POA: Diagnosis not present

## 2015-12-19 ENCOUNTER — Ambulatory Visit
Admission: RE | Admit: 2015-12-19 | Discharge: 2015-12-19 | Disposition: A | Discharge status: Home / Self Care | Payer: BLUE CROSS/BLUE SHIELD | Source: Ambulatory Visit | Attending: Radiation Oncology | Admitting: Radiation Oncology

## 2015-12-19 DIAGNOSIS — C61 Malignant neoplasm of prostate: Secondary | ICD-10-CM | POA: Diagnosis not present

## 2015-12-22 ENCOUNTER — Ambulatory Visit
Admission: RE | Admit: 2015-12-22 | Discharge: 2015-12-22 | Disposition: A | Discharge status: Home / Self Care | Payer: BLUE CROSS/BLUE SHIELD | Source: Ambulatory Visit | Attending: Radiation Oncology | Admitting: Radiation Oncology

## 2015-12-22 DIAGNOSIS — C61 Malignant neoplasm of prostate: Secondary | ICD-10-CM | POA: Diagnosis not present

## 2015-12-23 ENCOUNTER — Ambulatory Visit
Admission: RE | Admit: 2015-12-23 | Discharge: 2015-12-23 | Disposition: A | Discharge status: Home / Self Care | Payer: BLUE CROSS/BLUE SHIELD | Source: Ambulatory Visit | Attending: Radiation Oncology | Admitting: Radiation Oncology

## 2015-12-23 DIAGNOSIS — C61 Malignant neoplasm of prostate: Secondary | ICD-10-CM | POA: Diagnosis not present

## 2015-12-24 ENCOUNTER — Ambulatory Visit
Admission: RE | Admit: 2015-12-24 | Discharge: 2015-12-24 | Disposition: A | Discharge status: Home / Self Care | Payer: BLUE CROSS/BLUE SHIELD | Source: Ambulatory Visit | Attending: Radiation Oncology | Admitting: Radiation Oncology

## 2015-12-24 DIAGNOSIS — C61 Malignant neoplasm of prostate: Secondary | ICD-10-CM | POA: Diagnosis not present

## 2015-12-25 ENCOUNTER — Inpatient Hospital Stay: Payer: BLUE CROSS/BLUE SHIELD | Attending: Radiation Oncology

## 2015-12-25 ENCOUNTER — Ambulatory Visit
Admission: RE | Admit: 2015-12-25 | Discharge: 2015-12-25 | Disposition: A | Discharge status: Home / Self Care | Payer: BLUE CROSS/BLUE SHIELD | Source: Ambulatory Visit | Attending: Radiation Oncology | Admitting: Radiation Oncology

## 2015-12-25 DIAGNOSIS — C61 Malignant neoplasm of prostate: Secondary | ICD-10-CM | POA: Insufficient documentation

## 2015-12-25 LAB — CBC
HCT: 40.3 % (ref 40.0–52.0)
Hemoglobin: 13.8 g/dL (ref 13.0–18.0)
MCH: 31.1 pg (ref 26.0–34.0)
MCHC: 34.2 g/dL (ref 32.0–36.0)
MCV: 91 fL (ref 80.0–100.0)
PLATELETS: 115 10*3/uL — AB (ref 150–440)
RBC: 4.42 MIL/uL (ref 4.40–5.90)
RDW: 13.7 % (ref 11.5–14.5)
WBC: 3.1 10*3/uL — ABNORMAL LOW (ref 3.8–10.6)

## 2015-12-26 ENCOUNTER — Ambulatory Visit
Admission: RE | Admit: 2015-12-26 | Discharge: 2015-12-26 | Disposition: A | Discharge status: Home / Self Care | Payer: BLUE CROSS/BLUE SHIELD | Source: Ambulatory Visit | Attending: Radiation Oncology | Admitting: Radiation Oncology

## 2015-12-26 DIAGNOSIS — C61 Malignant neoplasm of prostate: Secondary | ICD-10-CM | POA: Diagnosis not present

## 2015-12-29 ENCOUNTER — Ambulatory Visit: Payer: BLUE CROSS/BLUE SHIELD

## 2015-12-30 ENCOUNTER — Ambulatory Visit
Admission: RE | Admit: 2015-12-30 | Discharge: 2015-12-30 | Disposition: A | Discharge status: Home / Self Care | Payer: BLUE CROSS/BLUE SHIELD | Source: Ambulatory Visit | Attending: Radiation Oncology | Admitting: Radiation Oncology

## 2015-12-30 DIAGNOSIS — C61 Malignant neoplasm of prostate: Secondary | ICD-10-CM | POA: Diagnosis not present

## 2015-12-31 ENCOUNTER — Ambulatory Visit
Admission: RE | Admit: 2015-12-31 | Discharge: 2015-12-31 | Disposition: A | Discharge status: Home / Self Care | Payer: BLUE CROSS/BLUE SHIELD | Source: Ambulatory Visit | Attending: Radiation Oncology | Admitting: Radiation Oncology

## 2015-12-31 DIAGNOSIS — C61 Malignant neoplasm of prostate: Secondary | ICD-10-CM | POA: Diagnosis not present

## 2016-01-01 ENCOUNTER — Ambulatory Visit
Admission: RE | Admit: 2016-01-01 | Discharge: 2016-01-01 | Disposition: A | Discharge status: Home / Self Care | Payer: BLUE CROSS/BLUE SHIELD | Source: Ambulatory Visit | Attending: Radiation Oncology | Admitting: Radiation Oncology

## 2016-01-01 DIAGNOSIS — C61 Malignant neoplasm of prostate: Secondary | ICD-10-CM | POA: Diagnosis not present

## 2016-01-02 ENCOUNTER — Ambulatory Visit
Admission: RE | Admit: 2016-01-02 | Discharge: 2016-01-02 | Disposition: A | Discharge status: Home / Self Care | Payer: BLUE CROSS/BLUE SHIELD | Source: Ambulatory Visit | Attending: Radiation Oncology | Admitting: Radiation Oncology

## 2016-01-02 DIAGNOSIS — C61 Malignant neoplasm of prostate: Secondary | ICD-10-CM | POA: Diagnosis not present

## 2016-01-05 ENCOUNTER — Ambulatory Visit
Admission: RE | Admit: 2016-01-05 | Discharge: 2016-01-05 | Disposition: A | Discharge status: Home / Self Care | Payer: BLUE CROSS/BLUE SHIELD | Source: Ambulatory Visit | Attending: Radiation Oncology | Admitting: Radiation Oncology

## 2016-01-05 DIAGNOSIS — C61 Malignant neoplasm of prostate: Secondary | ICD-10-CM | POA: Diagnosis not present

## 2016-01-06 ENCOUNTER — Other Ambulatory Visit: Payer: Self-pay | Admitting: *Deleted

## 2016-01-06 ENCOUNTER — Ambulatory Visit
Admission: RE | Admit: 2016-01-06 | Discharge: 2016-01-06 | Disposition: A | Discharge status: Home / Self Care | Payer: BLUE CROSS/BLUE SHIELD | Source: Ambulatory Visit | Attending: Radiation Oncology | Admitting: Radiation Oncology

## 2016-01-06 DIAGNOSIS — C61 Malignant neoplasm of prostate: Secondary | ICD-10-CM

## 2016-01-06 MED ORDER — TAMSULOSIN HCL 0.4 MG PO CAPS: 0.4000 mg | ORAL_CAPSULE | Freq: Every day | ORAL | Status: DC

## 2016-01-07 ENCOUNTER — Ambulatory Visit
Admission: RE | Admit: 2016-01-07 | Discharge: 2016-01-07 | Disposition: A | Discharge status: Home / Self Care | Payer: BLUE CROSS/BLUE SHIELD | Source: Ambulatory Visit | Attending: Radiation Oncology | Admitting: Radiation Oncology

## 2016-01-07 DIAGNOSIS — C61 Malignant neoplasm of prostate: Secondary | ICD-10-CM | POA: Diagnosis not present

## 2016-01-08 ENCOUNTER — Inpatient Hospital Stay: Payer: BLUE CROSS/BLUE SHIELD | Attending: Radiation Oncology

## 2016-01-08 ENCOUNTER — Ambulatory Visit
Admission: RE | Admit: 2016-01-08 | Discharge: 2016-01-08 | Disposition: A | Discharge status: Home / Self Care | Payer: BLUE CROSS/BLUE SHIELD | Source: Ambulatory Visit | Attending: Radiation Oncology | Admitting: Radiation Oncology

## 2016-01-08 DIAGNOSIS — C61 Malignant neoplasm of prostate: Secondary | ICD-10-CM | POA: Insufficient documentation

## 2016-01-08 LAB — CBC
HCT: 38.2 % — ABNORMAL LOW (ref 40.0–52.0)
HEMOGLOBIN: 13.2 g/dL (ref 13.0–18.0)
MCH: 30.9 pg (ref 26.0–34.0)
MCHC: 34.6 g/dL (ref 32.0–36.0)
MCV: 89.3 fL (ref 80.0–100.0)
Platelets: 172 10*3/uL (ref 150–440)
RBC: 4.27 MIL/uL — AB (ref 4.40–5.90)
RDW: 13.4 % (ref 11.5–14.5)
WBC: 5.2 10*3/uL (ref 3.8–10.6)

## 2016-01-09 ENCOUNTER — Ambulatory Visit
Admission: RE | Admit: 2016-01-09 | Discharge: 2016-01-09 | Disposition: A | Discharge status: Home / Self Care | Payer: BLUE CROSS/BLUE SHIELD | Source: Ambulatory Visit | Attending: Radiation Oncology | Admitting: Radiation Oncology

## 2016-01-09 DIAGNOSIS — C61 Malignant neoplasm of prostate: Secondary | ICD-10-CM | POA: Diagnosis not present

## 2016-01-12 ENCOUNTER — Ambulatory Visit
Admission: RE | Admit: 2016-01-12 | Discharge: 2016-01-12 | Disposition: A | Discharge status: Home / Self Care | Payer: BLUE CROSS/BLUE SHIELD | Source: Ambulatory Visit | Attending: Radiation Oncology | Admitting: Radiation Oncology

## 2016-01-12 DIAGNOSIS — C61 Malignant neoplasm of prostate: Secondary | ICD-10-CM | POA: Diagnosis not present

## 2016-01-13 ENCOUNTER — Ambulatory Visit
Admission: RE | Admit: 2016-01-13 | Discharge: 2016-01-13 | Disposition: A | Discharge status: Home / Self Care | Payer: BLUE CROSS/BLUE SHIELD | Source: Ambulatory Visit | Attending: Radiation Oncology | Admitting: Radiation Oncology

## 2016-01-13 DIAGNOSIS — C61 Malignant neoplasm of prostate: Secondary | ICD-10-CM | POA: Diagnosis not present

## 2016-01-14 ENCOUNTER — Ambulatory Visit
Admission: RE | Admit: 2016-01-14 | Discharge: 2016-01-14 | Disposition: A | Discharge status: Home / Self Care | Payer: BLUE CROSS/BLUE SHIELD | Source: Ambulatory Visit | Attending: Radiation Oncology | Admitting: Radiation Oncology

## 2016-01-14 DIAGNOSIS — C61 Malignant neoplasm of prostate: Secondary | ICD-10-CM | POA: Diagnosis not present

## 2016-01-15 ENCOUNTER — Ambulatory Visit
Admission: RE | Admit: 2016-01-15 | Discharge: 2016-01-15 | Disposition: A | Discharge status: Home / Self Care | Payer: BLUE CROSS/BLUE SHIELD | Source: Ambulatory Visit | Attending: Radiation Oncology | Admitting: Radiation Oncology

## 2016-01-15 DIAGNOSIS — C61 Malignant neoplasm of prostate: Secondary | ICD-10-CM | POA: Diagnosis not present

## 2016-01-16 ENCOUNTER — Ambulatory Visit
Admission: RE | Admit: 2016-01-16 | Discharge: 2016-01-16 | Disposition: A | Discharge status: Home / Self Care | Payer: BLUE CROSS/BLUE SHIELD | Source: Ambulatory Visit | Attending: Radiation Oncology | Admitting: Radiation Oncology

## 2016-01-16 DIAGNOSIS — C61 Malignant neoplasm of prostate: Secondary | ICD-10-CM | POA: Diagnosis not present

## 2016-01-19 ENCOUNTER — Ambulatory Visit
Admission: RE | Admit: 2016-01-19 | Discharge: 2016-01-19 | Disposition: A | Discharge status: Home / Self Care | Payer: BLUE CROSS/BLUE SHIELD | Source: Ambulatory Visit | Attending: Radiation Oncology | Admitting: Radiation Oncology

## 2016-01-19 DIAGNOSIS — C61 Malignant neoplasm of prostate: Secondary | ICD-10-CM | POA: Diagnosis not present

## 2016-01-20 ENCOUNTER — Ambulatory Visit
Admission: RE | Admit: 2016-01-20 | Discharge: 2016-01-20 | Disposition: A | Discharge status: Home / Self Care | Payer: BLUE CROSS/BLUE SHIELD | Source: Ambulatory Visit | Attending: Radiation Oncology | Admitting: Radiation Oncology

## 2016-01-20 DIAGNOSIS — C61 Malignant neoplasm of prostate: Secondary | ICD-10-CM | POA: Diagnosis not present

## 2016-01-21 ENCOUNTER — Ambulatory Visit
Admission: RE | Admit: 2016-01-21 | Discharge: 2016-01-21 | Disposition: A | Discharge status: Home / Self Care | Payer: BLUE CROSS/BLUE SHIELD | Source: Ambulatory Visit | Attending: Radiation Oncology | Admitting: Radiation Oncology

## 2016-01-21 DIAGNOSIS — C61 Malignant neoplasm of prostate: Secondary | ICD-10-CM | POA: Diagnosis not present

## 2016-01-22 ENCOUNTER — Inpatient Hospital Stay: Payer: BLUE CROSS/BLUE SHIELD

## 2016-01-22 ENCOUNTER — Ambulatory Visit
Admission: RE | Admit: 2016-01-22 | Discharge: 2016-01-22 | Disposition: A | Discharge status: Home / Self Care | Payer: BLUE CROSS/BLUE SHIELD | Source: Ambulatory Visit | Attending: Radiation Oncology | Admitting: Radiation Oncology

## 2016-01-22 DIAGNOSIS — C61 Malignant neoplasm of prostate: Secondary | ICD-10-CM | POA: Diagnosis not present

## 2016-01-22 LAB — CBC
HEMATOCRIT: 38.3 % — AB (ref 40.0–52.0)
Hemoglobin: 13.6 g/dL (ref 13.0–18.0)
MCH: 31.8 pg (ref 26.0–34.0)
MCHC: 35.5 g/dL (ref 32.0–36.0)
MCV: 89.5 fL (ref 80.0–100.0)
Platelets: 154 10*3/uL (ref 150–440)
RBC: 4.28 MIL/uL — AB (ref 4.40–5.90)
RDW: 14.3 % (ref 11.5–14.5)
WBC: 4.7 10*3/uL (ref 3.8–10.6)

## 2016-01-23 ENCOUNTER — Ambulatory Visit
Admission: RE | Admit: 2016-01-23 | Discharge: 2016-01-23 | Disposition: A | Discharge status: Home / Self Care | Payer: BLUE CROSS/BLUE SHIELD | Source: Ambulatory Visit | Attending: Radiation Oncology | Admitting: Radiation Oncology

## 2016-01-23 DIAGNOSIS — C61 Malignant neoplasm of prostate: Secondary | ICD-10-CM | POA: Diagnosis not present

## 2016-01-26 ENCOUNTER — Ambulatory Visit
Admission: RE | Admit: 2016-01-26 | Discharge: 2016-01-26 | Disposition: A | Discharge status: Home / Self Care | Payer: BLUE CROSS/BLUE SHIELD | Source: Ambulatory Visit | Attending: Radiation Oncology | Admitting: Radiation Oncology

## 2016-01-26 DIAGNOSIS — C61 Malignant neoplasm of prostate: Secondary | ICD-10-CM | POA: Diagnosis not present

## 2016-01-27 ENCOUNTER — Ambulatory Visit
Admission: RE | Admit: 2016-01-27 | Discharge: 2016-01-27 | Disposition: A | Discharge status: Home / Self Care | Payer: BLUE CROSS/BLUE SHIELD | Source: Ambulatory Visit | Attending: Radiation Oncology | Admitting: Radiation Oncology

## 2016-01-27 DIAGNOSIS — C61 Malignant neoplasm of prostate: Secondary | ICD-10-CM | POA: Diagnosis not present

## 2016-01-28 ENCOUNTER — Ambulatory Visit
Admission: RE | Admit: 2016-01-28 | Discharge: 2016-01-28 | Disposition: A | Discharge status: Home / Self Care | Payer: BLUE CROSS/BLUE SHIELD | Source: Ambulatory Visit | Attending: Radiation Oncology | Admitting: Radiation Oncology

## 2016-01-28 DIAGNOSIS — C61 Malignant neoplasm of prostate: Secondary | ICD-10-CM | POA: Diagnosis not present

## 2016-01-29 ENCOUNTER — Ambulatory Visit
Admission: RE | Admit: 2016-01-29 | Discharge: 2016-01-29 | Disposition: A | Discharge status: Home / Self Care | Payer: BLUE CROSS/BLUE SHIELD | Source: Ambulatory Visit | Attending: Radiation Oncology | Admitting: Radiation Oncology

## 2016-01-29 DIAGNOSIS — C61 Malignant neoplasm of prostate: Secondary | ICD-10-CM | POA: Diagnosis not present

## 2016-01-30 ENCOUNTER — Ambulatory Visit
Admission: RE | Admit: 2016-01-30 | Discharge: 2016-01-30 | Disposition: A | Discharge status: Home / Self Care | Payer: BLUE CROSS/BLUE SHIELD | Source: Ambulatory Visit | Attending: Radiation Oncology | Admitting: Radiation Oncology

## 2016-01-30 DIAGNOSIS — C61 Malignant neoplasm of prostate: Secondary | ICD-10-CM | POA: Diagnosis not present

## 2016-02-02 ENCOUNTER — Ambulatory Visit
Admission: RE | Admit: 2016-02-02 | Discharge: 2016-02-02 | Disposition: A | Discharge status: Home / Self Care | Payer: BLUE CROSS/BLUE SHIELD | Source: Ambulatory Visit | Attending: Radiation Oncology | Admitting: Radiation Oncology

## 2016-02-02 DIAGNOSIS — C61 Malignant neoplasm of prostate: Secondary | ICD-10-CM | POA: Diagnosis not present

## 2016-02-03 ENCOUNTER — Ambulatory Visit
Admission: RE | Admit: 2016-02-03 | Discharge: 2016-02-03 | Disposition: A | Discharge status: Home / Self Care | Payer: BLUE CROSS/BLUE SHIELD | Source: Ambulatory Visit | Attending: Radiation Oncology | Admitting: Radiation Oncology

## 2016-02-03 DIAGNOSIS — C61 Malignant neoplasm of prostate: Secondary | ICD-10-CM | POA: Diagnosis not present

## 2016-03-08 ENCOUNTER — Other Ambulatory Visit: Payer: Self-pay | Admitting: *Deleted

## 2016-03-08 ENCOUNTER — Ambulatory Visit
Admission: RE | Admit: 2016-03-08 | Discharge: 2016-03-08 | Disposition: A | Discharge status: Home / Self Care | Payer: BLUE CROSS/BLUE SHIELD | Source: Ambulatory Visit | Attending: Radiation Oncology | Admitting: Radiation Oncology

## 2016-03-08 ENCOUNTER — Encounter: Payer: Self-pay | Admitting: Radiation Oncology

## 2016-03-08 VITALS — BP 148/88 | HR 85 | Temp 97.1°F | Resp 20 | Wt 210.8 lb

## 2016-03-08 DIAGNOSIS — C61 Malignant neoplasm of prostate: Secondary | ICD-10-CM

## 2016-03-08 NOTE — Progress Notes (Signed)
Radiation Oncology Follow up Note  Name: Cody DEPERALTA Sr.   Date:   03/08/2016 MRN:  [id number] DOB: 1951-02-13    This 65 y.o. 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  REFERRING PROVIDER: [name], MD  HPI: Patient is a 65 year old male now one month out of salvage radiation therapy to his prostate and pelvic nodes for Gleason 7 (4+3) adenocarcinoma the prostate stage IIIa (T3a N0 M0) status post robot-assisted prostatectomy in 2014. He had rising PSA. He seen today in routine follow-up and is doing well. Specifically denies any increasing lower urinary tract symptoms diarrhea fatigue..  COMPLICATIONS OF TREATMENT: none  FOLLOW UP COMPLIANCE: keeps appointments   PHYSICAL EXAM:  BP 148/88 mmHg  Pulse 85  Temp(Src) 97.1 F (36.2 C)  Resp 20  Wt 210 lb 12.2 oz (95.6 kg) On rectal exam rectal sphincter tone is good prostatic fossa is clear without evidence of nodularity or mass. No other rectal abnormalities identified. Well-developed well-nourished patient in NAD. HEENT reveals PERLA, EOMI, discs not visualized.  Oral cavity is clear. No oral mucosal lesions are identified. Neck is clear without evidence of cervical or supraclavicular adenopathy. Lungs are clear to A&P. Cardiac examination is essentially unremarkable with regular rate and rhythm without murmur rub or thrill. Abdomen is benign with no organomegaly or masses noted. Motor sensory and DTR levels are equal and symmetric in the upper and lower extremities. Cranial nerves II through XII are grossly intact. Proprioception is intact. No peripheral adenopathy or edema is identified. No motor or sensory levels are noted. Crude visual fields are within normal range.  RADIOLOGY RESULTS: No current films for review  PLAN: Present time patient is doing well with  no evidence of disease. I've explained to him my protocol for waiting another 3-4 months for his PSA to be tested. I'm please was low side effect profile. I've asked to see him back in 3-4 months well a PSA drawn that day. Patient knows to call sooner with any concerns.  I would like to take this opportunity for allowing me to participate in the care of your patient.Armstead Peaks., MD

## 2016-07-19 ENCOUNTER — Other Ambulatory Visit: Payer: Self-pay

## 2016-07-19 ENCOUNTER — Ambulatory Visit
Admission: RE | Admit: 2016-07-19 | Discharge: 2016-07-19 | Disposition: A | Discharge status: Home / Self Care | Payer: BLUE CROSS/BLUE SHIELD | Source: Ambulatory Visit | Attending: Radiation Oncology | Admitting: Radiation Oncology

## 2016-07-19 ENCOUNTER — Encounter (INDEPENDENT_AMBULATORY_CARE_PROVIDER_SITE_OTHER): Payer: Self-pay

## 2016-07-19 ENCOUNTER — Inpatient Hospital Stay: Payer: BLUE CROSS/BLUE SHIELD | Attending: Radiation Oncology

## 2016-07-19 ENCOUNTER — Other Ambulatory Visit: Payer: Self-pay | Admitting: *Deleted

## 2016-07-19 VITALS — BP 140/85 | HR 73 | Temp 97.3°F | Wt 203.4 lb

## 2016-07-19 DIAGNOSIS — Z923 Personal history of irradiation: Secondary | ICD-10-CM | POA: Insufficient documentation

## 2016-07-19 DIAGNOSIS — C61 Malignant neoplasm of prostate: Secondary | ICD-10-CM

## 2016-07-19 DIAGNOSIS — Z9079 Acquired absence of other genital organ(s): Secondary | ICD-10-CM | POA: Insufficient documentation

## 2016-07-19 LAB — PSA: PSA: 0.44 ng/mL (ref 0.00–4.00)

## 2016-07-19 NOTE — Progress Notes (Signed)
Radiation Oncology Follow up Note  Name: Cody STAGER Sr.   Date:   07/19/2016 MRN:  [id number] DOB: 1951-01-10    This 65 y.o. male presents to the clinic today for 5 month follow-up for prostate cancer status post salvage radiation therapy for stage IIIa disease status post radical prostatectomy in 2014.  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 He is seen today in routine follow-up is doing well. Specifically denies any exacerbation and urinary tract symptoms or diarrhea. We drew a PSA level on him today..  COMPLICATIONS OF TREATMENT: none  FOLLOW UP COMPLIANCE: keeps appointments   PHYSICAL EXAM:  BP 140/85   Pulse 73   Temp 97.3 F (36.3 C)   Wt 203 lb 6 oz (92.3 kg)   BMI 30.03 kg/m  On rectal exam rectal sphincter tone is good prostatic fossa is clear without evidence of nodularity or mass. Well-developed well-nourished patient in NAD. HEENT reveals PERLA, EOMI, discs not visualized.  Oral cavity is clear. No oral mucosal lesions are identified. Neck is clear without evidence of cervical or supraclavicular adenopathy. Lungs are clear to A&P. Cardiac examination is essentially unremarkable with regular rate and rhythm without murmur rub or thrill. Abdomen is benign with no organomegaly or masses noted. Motor sensory and DTR levels are equal and symmetric in the upper and lower extremities. Cranial nerves II through XII are grossly intact. Proprioception is intact. No peripheral adenopathy or edema is identified. No motor or sensory levels are noted. Crude visual fields are within normal range.  RADIOLOGY RESULTS: No current films for review  PLAN: PSA was drawn today will and will be reported separately. Otherwise I'm please was overall progress. I've asked to see him back in 6  months and will obtain a PSA prior to his visit. Patient knows to call sooner with any concerns.  I would like to take this opportunity to thank you for allowing me to participate in the care of your patient.Armstead Peaks., MD

## 2016-07-23 ENCOUNTER — Other Ambulatory Visit: Payer: Self-pay | Admitting: *Deleted

## 2016-07-23 DIAGNOSIS — C61 Malignant neoplasm of prostate: Secondary | ICD-10-CM

## 2017-01-24 ENCOUNTER — Inpatient Hospital Stay: Payer: Medicare Other | Attending: Radiation Oncology

## 2017-01-24 DIAGNOSIS — C61 Malignant neoplasm of prostate: Secondary | ICD-10-CM | POA: Insufficient documentation

## 2017-01-24 LAB — PSA: PSA: 1.1 ng/mL (ref 0.00–4.00)

## 2017-01-31 ENCOUNTER — Ambulatory Visit: Payer: BLUE CROSS/BLUE SHIELD | Admitting: Radiation Oncology

## 2017-02-03 ENCOUNTER — Ambulatory Visit
Admission: RE | Admit: 2017-02-03 | Discharge: 2017-02-03 | Disposition: A | Discharge status: Home / Self Care | Payer: Medicare Other | Source: Ambulatory Visit | Attending: Radiation Oncology | Admitting: Radiation Oncology

## 2017-02-03 ENCOUNTER — Other Ambulatory Visit: Payer: Self-pay | Admitting: *Deleted

## 2017-02-03 ENCOUNTER — Encounter: Payer: Self-pay | Admitting: Radiation Oncology

## 2017-02-03 VITALS — BP 153/85 | HR 73 | Temp 97.6°F | Ht 69.0 in | Wt 205.5 lb

## 2017-02-03 DIAGNOSIS — C61 Malignant neoplasm of prostate: Secondary | ICD-10-CM

## 2017-02-03 NOTE — Progress Notes (Signed)
Radiation Oncology Follow up Note  Name: Cody HEAD Sr.   Date:   02/03/2017 MRN:  [id number] DOB: 02/19/1951    This 66 y.o. male presents to the clinic today for 1 year follow-up status post salvage radiation therapy for adenocarcinoma the prostate.  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 His original tumor did show extracapsular extension and lymphovascular invasion. His prostatectomy was in 2014. He did have some increased uptake on his bone scan. Not definitive for metastatic disease on plain films but did show some abnormal sclerosis in the right issue. His most recent PSA hasn't bumped to 1.1 from 0.44 back in September 2017. He's having no increased urinary tracts lower urinary tract symptoms diarrhea.  COMPLICATIONS OF TREATMENT: none  FOLLOW UP COMPLIANCE: keeps appointments   PHYSICAL EXAM:  BP (!) 153/85 (BP Location: Right Arm, Patient Position: Sitting, Cuff Size: Large)   Pulse 73   Temp 97.6 F (36.4 C) (Tympanic)   Ht 5\' 9"  (1.753 m)   Wt 205 lb 7.5 oz (93.2 kg)   BMI 30.34 kg/m  On rectal exam rectal sphincter tone is good prostatic fossa is clear without evidence of nodularity or mass rectal exam is unremarkable. Well-developed well-nourished patient in NAD. HEENT reveals PERLA, EOMI, discs not visualized.  Oral cavity is clear. No oral mucosal lesions are identified. Neck is clear without evidence of cervical or supraclavicular adenopathy. Lungs are clear to A&P. Cardiac examination is essentially unremarkable with regular rate and rhythm without murmur rub or thrill. Abdomen is benign with no organomegaly or masses noted. Motor sensory and DTR levels are equal and symmetric in the upper and lower extremities. Cranial nerves II through XII are grossly intact. Proprioception is intact. No  peripheral adenopathy or edema is identified. No motor or sensory levels are noted. Crude visual fields are within normal range.  RADIOLOGY RESULTS: Previous bone scans and plain films reviewed compatible with the above-stated findings  PLAN: At this time he does have progressive rise in his PSA. Urology has suggested possibly Lupron therapy at this time. I've asked to see him back in 3 months at which time I'll repeat his PSA should he show progression of disease again would would agree with Lupron therapy. I also would make referral to time to medical oncology for possibility of systemic treatment as is the new [place] and guidelines. Patient Has my treatment plan well.  I would like to take this opportunity to thank you for allowing me to participate in the care of your patient.Armstead Peaks., MD

## 2017-03-11 ENCOUNTER — Encounter: Payer: Self-pay | Admitting: *Deleted

## 2017-04-04 DIAGNOSIS — E538 Deficiency of other specified B group vitamins: Secondary | ICD-10-CM | POA: Insufficient documentation

## 2017-05-11 ENCOUNTER — Inpatient Hospital Stay: Payer: Medicare Other | Attending: Radiation Oncology

## 2017-05-11 DIAGNOSIS — Z9079 Acquired absence of other genital organ(s): Secondary | ICD-10-CM | POA: Diagnosis not present

## 2017-05-11 DIAGNOSIS — Z923 Personal history of irradiation: Secondary | ICD-10-CM | POA: Insufficient documentation

## 2017-05-11 DIAGNOSIS — C61 Malignant neoplasm of prostate: Secondary | ICD-10-CM | POA: Diagnosis not present

## 2017-05-11 DIAGNOSIS — Z79818 Long term (current) use of other agents affecting estrogen receptors and estrogen levels: Secondary | ICD-10-CM | POA: Insufficient documentation

## 2017-05-11 LAB — PSA: PROSTATIC SPECIFIC ANTIGEN: 2.16 ng/mL (ref 0.00–4.00)

## 2017-05-18 ENCOUNTER — Ambulatory Visit: Payer: Medicare Other | Admitting: Radiation Oncology

## 2017-05-23 ENCOUNTER — Encounter: Payer: Self-pay | Admitting: Radiation Oncology

## 2017-05-23 ENCOUNTER — Ambulatory Visit
Admission: RE | Admit: 2017-05-23 | Discharge: 2017-05-23 | Disposition: A | Discharge status: Home / Self Care | Payer: Medicare Other | Source: Ambulatory Visit | Attending: Radiation Oncology | Admitting: Radiation Oncology

## 2017-05-23 ENCOUNTER — Other Ambulatory Visit: Payer: Self-pay | Admitting: *Deleted

## 2017-05-23 VITALS — BP 160/90 | HR 78 | Temp 97.3°F | Resp 20 | Wt 201.6 lb

## 2017-05-23 DIAGNOSIS — C61 Malignant neoplasm of prostate: Secondary | ICD-10-CM | POA: Diagnosis present

## 2017-05-23 DIAGNOSIS — Z923 Personal history of irradiation: Secondary | ICD-10-CM | POA: Insufficient documentation

## 2017-05-23 NOTE — Progress Notes (Signed)
Radiation Oncology Follow up Note  Name: Cody BOLANDER Sr.   Date:   05/23/2017 MRN:  [id number] DOB: 1951/02/24    This 66 y.o. male presents to the clinic today for close to one half year follow-up for salvage radiation therapy status post radical prostatectomy for adenocarcinoma the prostate.  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 Original tumor had extracapsular extension lymphovascular invasion his prostatectomy was in 2014. He did have some slight increased uptake on bone scan although not definitive for metastatic disease on plain films. His right pelvis had an area of abnormal sclerosis being followed. Recently his PSA went from 1.1back inMarch to most recent PSA of 2.1. He continues to have knee pain is considering left knee replacement. Otherwise no specific bone pain.  COMPLICATIONS OF TREATMENT: none  FOLLOW UP COMPLIANCE: keeps appointments   PHYSICAL EXAM:  BP (!) 160/90   Pulse 78   Temp (!) 97.3 F (36.3 C)   Resp 20   Wt 201 lb 9.8 oz (91.4 kg)   BMI 29.77 kg/m  Well-developed well-nourished patient in NAD. HEENT reveals PERLA, EOMI, discs not visualized.  Oral cavity is clear. No oral mucosal lesions are identified. Neck is clear without evidence of cervical or supraclavicular adenopathy. Lungs are clear to A&P. Cardiac examination is essentially unremarkable with regular rate and rhythm without murmur rub or thrill. Abdomen is benign with no organomegaly or masses noted. Motor sensory and DTR levels are equal and symmetric in the upper and lower extremities. Cranial nerves II through XII are grossly intact. Proprioception is intact. No peripheral adenopathy or edema is identified. No motor or sensory levels are noted. Crude visual fields are within normal range.  RADIOLOGY  RESULTS: bone scan ordered  PLAN: at this time I'm ordering a bone scan since this has not been done since January 2017. I'm also starting the patient on Lupron 4 month Depo with reevaluation at 6 months. Depending on bone scan findings will also refer him to medical oncology for consideration of treatment in the near future.All of this was explained to the patient he comprehend my treatment plan well.  I would like to take this opportunity to thank you for allowing me to participate in the care of your patient.Armstead Peaks., MD

## 2017-05-24 ENCOUNTER — Other Ambulatory Visit: Payer: Self-pay | Admitting: *Deleted

## 2017-05-24 DIAGNOSIS — C61 Malignant neoplasm of prostate: Secondary | ICD-10-CM

## 2017-05-30 ENCOUNTER — Ambulatory Visit
Admission: RE | Admit: 2017-05-30 | Discharge: 2017-05-30 | Disposition: A | Discharge status: Home / Self Care | Payer: Medicare Other | Source: Ambulatory Visit | Attending: Radiation Oncology | Admitting: Radiation Oncology

## 2017-05-30 ENCOUNTER — Encounter
Admission: RE | Admit: 2017-05-30 | Discharge: 2017-05-30 | Disposition: A | Discharge status: Home / Self Care | Payer: Medicare Other | Source: Ambulatory Visit | Attending: Radiation Oncology | Admitting: Radiation Oncology

## 2017-05-30 DIAGNOSIS — C61 Malignant neoplasm of prostate: Secondary | ICD-10-CM | POA: Diagnosis present

## 2017-05-30 IMAGING — NM NM BONE WHOLE BODY
2 series · 8 of 8 positions shown · non-contrast
Comparison: November 26, 2015

CLINICAL DATA: Bone pain.  Prostate cancer with rising PSA.

EXAM:
NUCLEAR MEDICINE WHOLE BODY BONE SCAN
TECHNIQUE: Whole body anterior and posterior images were obtained approximately
3 hours after intravenous injection of radiopharmaceutical.
RADIOPHARMACEUTICALS:  23.51 mCi Oechnetium-AAm MDP IV

[Series 1000: statics · 2.40mm/px · 3 acquisitions, 6 frames shown]
[im 1/3]
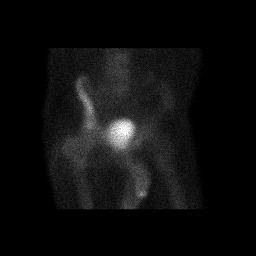
[im 1/3]
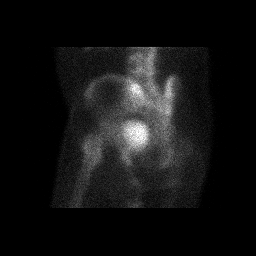
[im 2/3]
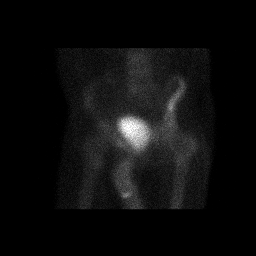
[im 2/3]
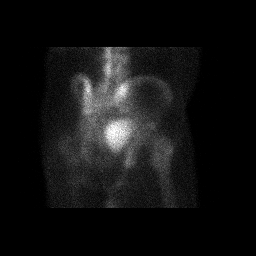
[im 3/3]
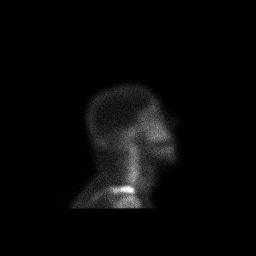
[im 3/3]
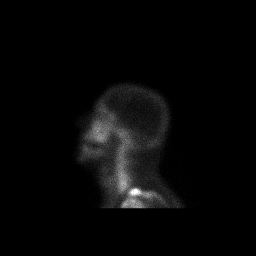

[Series 1000: 3 hr wholebody · 2.40mm/px · 2 of 2 frames shown]
[frame 1/2]
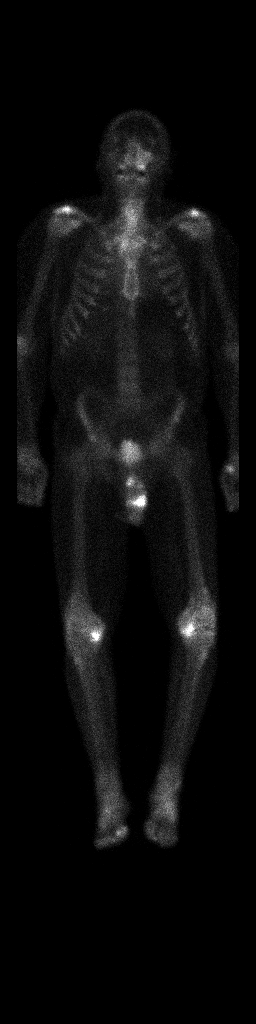
[frame 2/2]
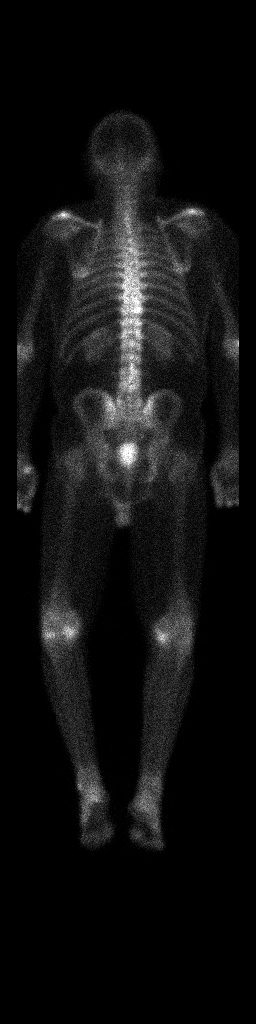

[8 of 8 positions shown; findings below may reference images not displayed]

FINDINGS: The uptake in the right posterior inferior pubic ramus on the
previous study has improved. The uptake in the distal left femoral
metadiaphysis has improved. Uptake in the left maxilla is consistent
with odontogenic and sinus disease. Scattered degenerative changes.
No other sites of metastatic disease identified.
IMPRESSION: Improvement of the uptake in the right posterior inferior pubic
ramus and the distal left femoral metadiaphysis. No other changes.

## 2017-05-30 MED ORDER — TECHNETIUM TC 99M MEDRONATE IV KIT
25.0000 | PACK | Freq: Once | INTRAVENOUS | Status: AC | PRN
  Administered 2017-05-30: 23.51 via INTRAVENOUS

## 2017-06-01 ENCOUNTER — Inpatient Hospital Stay: Payer: Medicare Other | Attending: Radiation Oncology

## 2017-06-01 DIAGNOSIS — Z79818 Long term (current) use of other agents affecting estrogen receptors and estrogen levels: Secondary | ICD-10-CM | POA: Diagnosis not present

## 2017-06-01 DIAGNOSIS — C61 Malignant neoplasm of prostate: Secondary | ICD-10-CM

## 2017-06-01 DIAGNOSIS — Z9079 Acquired absence of other genital organ(s): Secondary | ICD-10-CM | POA: Diagnosis not present

## 2017-06-01 DIAGNOSIS — Z923 Personal history of irradiation: Secondary | ICD-10-CM | POA: Insufficient documentation

## 2017-06-01 MED ORDER — LEUPROLIDE ACETATE (4 MONTH) 30 MG IM KIT
30.0000 mg | PACK | Freq: Once | INTRAMUSCULAR | Status: AC
  Administered 2017-06-01: 30 mg via INTRAMUSCULAR
  Filled 2017-06-01: qty 30

## 2017-06-09 ENCOUNTER — Telehealth: Payer: Self-pay | Admitting: *Deleted

## 2017-06-09 NOTE — Telephone Encounter (Signed)
Spoke to Cody Ellison and gave her the bone scan results,  per Dr. Baruch Gouty the bone scan looked good.  Also gave her appointments for lab work and follow up with Dr. Baruch Gouty in 6 months.  [name] verbalized understanding with read back of appointment date and times.

## 2017-07-06 ENCOUNTER — Encounter
Admission: RE | Admit: 2017-07-06 | Discharge: 2017-07-06 | Disposition: A | Discharge status: Home / Self Care | Payer: Medicare Other | Source: Ambulatory Visit | Attending: Orthopedic Surgery | Admitting: Orthopedic Surgery

## 2017-07-06 DIAGNOSIS — I10 Essential (primary) hypertension: Secondary | ICD-10-CM | POA: Diagnosis not present

## 2017-07-06 DIAGNOSIS — K219 Gastro-esophageal reflux disease without esophagitis: Secondary | ICD-10-CM | POA: Diagnosis not present

## 2017-07-06 DIAGNOSIS — Z01810 Encounter for preprocedural cardiovascular examination: Secondary | ICD-10-CM | POA: Diagnosis present

## 2017-07-06 DIAGNOSIS — Z01812 Encounter for preprocedural laboratory examination: Secondary | ICD-10-CM | POA: Diagnosis present

## 2017-07-06 DIAGNOSIS — C61 Malignant neoplasm of prostate: Secondary | ICD-10-CM | POA: Insufficient documentation

## 2017-07-06 DIAGNOSIS — E785 Hyperlipidemia, unspecified: Secondary | ICD-10-CM | POA: Insufficient documentation

## 2017-07-06 DIAGNOSIS — I2510 Atherosclerotic heart disease of native coronary artery without angina pectoris: Secondary | ICD-10-CM | POA: Insufficient documentation

## 2017-07-06 HISTORY — DX: Hyperlipidemia, unspecified: E78.5

## 2017-07-06 LAB — COMPREHENSIVE METABOLIC PANEL
ALBUMIN: 4.1 g/dL (ref 3.5–5.0)
ALK PHOS: 46 U/L (ref 38–126)
ALT: 20 U/L (ref 17–63)
ANION GAP: 7 (ref 5–15)
AST: 20 U/L (ref 15–41)
BUN: 24 mg/dL — ABNORMAL HIGH (ref 6–20)
CALCIUM: 9.6 mg/dL (ref 8.9–10.3)
CHLORIDE: 107 mmol/L (ref 101–111)
CO2: 27 mmol/L (ref 22–32)
Creatinine, Ser: 0.91 mg/dL (ref 0.61–1.24)
GFR calc Af Amer: >60 mL/min (ref >60)
GFR calc non Af Amer: >60 mL/min (ref >60)
GLUCOSE: 105 mg/dL — AB (ref 65–99)
Potassium: 3.9 mmol/L (ref 3.5–5.1)
SODIUM: 141 mmol/L (ref 135–145)
Total Bilirubin: 1.2 mg/dL (ref 0.3–1.2)
Total Protein: 6.8 g/dL (ref 6.5–8.1)

## 2017-07-06 LAB — SEDIMENTATION RATE: Sed Rate: 8 mm/hr (ref 0–20)

## 2017-07-06 LAB — URINALYSIS, ROUTINE W REFLEX MICROSCOPIC
BILIRUBIN URINE: NEGATIVE
GLUCOSE, UA: NEGATIVE mg/dL
HGB URINE DIPSTICK: NEGATIVE
KETONES UR: NEGATIVE mg/dL
Leukocytes, UA: NEGATIVE
Nitrite: NEGATIVE
PROTEIN: NEGATIVE mg/dL
Specific Gravity, Urine: 1.018 (ref 1.005–1.030)
pH: 5 (ref 5.0–8.0)

## 2017-07-06 LAB — APTT: APTT: 25 s (ref 24–36)

## 2017-07-06 LAB — TYPE AND SCREEN
ABO/RH(D): O POS
Antibody Screen: NEGATIVE

## 2017-07-06 LAB — CBC
HCT: 39.3 % — ABNORMAL LOW (ref 40.0–52.0)
HEMOGLOBIN: 13.6 g/dL (ref 13.0–18.0)
MCH: 32.4 pg (ref 26.0–34.0)
MCHC: 34.6 g/dL (ref 32.0–36.0)
MCV: 93.8 fL (ref 80.0–100.0)
PLATELETS: 173 10*3/uL (ref 150–440)
RBC: 4.2 MIL/uL — AB (ref 4.40–5.90)
RDW: 13.6 % (ref 11.5–14.5)
WBC: 6.3 10*3/uL (ref 3.8–10.6)

## 2017-07-06 LAB — PROTIME-INR
INR: 0.93
Prothrombin Time: 12.4 seconds (ref 11.4–15.2)

## 2017-07-06 LAB — SURGICAL PCR SCREEN
MRSA, PCR: NEGATIVE
STAPHYLOCOCCUS AUREUS: NEGATIVE

## 2017-07-06 LAB — C-REACTIVE PROTEIN: CRP: 0.9 mg/dL (ref <1.0)

## 2017-07-06 NOTE — Patient Instructions (Signed)
Your procedure is scheduled on: September 19,2018 [employer] ) Report to Same Day Surgery 2nd floor medical mall ([place] Entrance-take elevator on left to 2nd floor.  Check in with surgery information desk.) To find out your arrival time please call [phone] between 1PM - 3PM on July 19, 2017 [facility] )  Remember: Instructions that are not followed completely may result in serious medical risk, up to and including death, or upon the discretion of your surgeon and anesthesiologist your surgery may need to be rescheduled.    _x___ 1. Do not eat food after midnight the night before your procedure. You may drink clear liquids up to 2 hours before you are scheduled to arrive at the hospital for your procedure.  Do not drink clear liquids within 2 hours of your scheduled arrival to the hospital.  Clear liquids include  --Water or Apple juice without pulp  --Clear carbohydrate beverage such as ClearFast or Gatorade  --Black Coffee or Clear Tea (No milk, no creamers, do not add anything to                  the coffee or Tea Type 1 and type 2 diabetics should only drink water.  No gum chewing or hard candies.     __x__ 2. No Alcohol for 24 hours before or after surgery.   __x__3. No Smoking for 24 prior to surgery.   ____  4. Bring all medications with you on the day of surgery if instructed.    __x__ 5. Notify your doctor if there is any change in your medical condition     (cold, fever, infections).     Do not wear jewelry, make-up, hairpins, clips or nail polish.  Do not wear lotions, powders, or perfumes  Do not shave 48 hours prior to surgery. Men may shave face and neck.  Do not bring valuables to the hospital.    [facility] is not responsible for any belongings or valuables.               Contacts, dentures or bridgework may not be worn into surgery.  Leave your suitcase in the car. After surgery it may be brought to your room.  For patients admitted to the hospital,  discharge time is determined by your  treatment team                  Patients discharged the day of surgery will not be allowed to drive home.  You will need someone to drive you home and stay with you the night of your procedure.    Please read over the following fact sheets that you were given:   [facility] Preparing for Surgery and or MRSA Information   TAKE THE FOLLOWING MEDICATIONS THE MORNING OF SURGERY WITH A SIP OF WATER :  1. ATENOLOL  2. OMEPRAZOLE  3. OMEPRAZOLE AT BEDTIME ON SEPTEMBER  18  4.  5.  6.  ____Fleets enema or Magnesium Citrate as directed.   _x___ Use CHG Soap or sage wipes as directed on instruction sheet   ____ Use inhalers on the day of surgery and bring to hospital day of surgery  ____ Stop Metformin and Janumet 2 days prior to surgery.    ____ Take 1/2 of usual insulin dose the night before surgery and none on the morning     surgery.   _x___ Follow recommendations from Cardiologist, Pulmonologist or PCP regarding  stopping Aspirin, Coumadin, Plavix ,Eliquis, Effient, or Pradaxa, and Pletal. (STOP ASPIRIN TEN DAYS PRIOR TO SURGERY AS INSTRUCTED BY DR HOOTEN )  X____Stop Anti-inflammatories such as Advil, Aleve, Ibuprofen, Motrin, Naproxen, Naprosyn, Goodies powders or aspirin products. OK to take Tylenol    _x___ Stop supplements until after surgery.  But may continue Vitamin D, Vitamin B, and multivitamin (STOP COQ 10 AND VITAMIN E NOW )   ____ Bring C-Pap to the hospital.

## 2017-07-07 LAB — URINE CULTURE
CULTURE: NO GROWTH
Special Requests: NORMAL

## 2017-07-14 ENCOUNTER — Encounter: Payer: Self-pay | Admitting: *Deleted

## 2017-07-17 NOTE — Discharge Instructions (Signed)
°  Instructions after Total Knee Replacement ° ° Cody Ellison P. Terriyah Westra, Jr., M.D.    ° Dept. of Orthopaedics & Sports Medicine ° [facility] ° [address] ° [place], [place]  [address] ° Phone: [phone]   Fax: [phone] ° °  °DIET: °• Drink plenty of non-alcoholic fluids. °• Resume your normal diet. Include foods high in fiber. ° °ACTIVITY:  °• You may use crutches or a walker with weight-bearing as tolerated, unless instructed otherwise. °• You may be weaned off of the walker or crutches by your Physical Therapist.  °• Do NOT place pillows under the knee. Anything placed under the knee could limit your ability to straighten the knee.   °• Continue doing gentle exercises. Exercising will reduce the pain and swelling, increase motion, and prevent muscle weakness.   °• Please continue to use the TED compression stockings for 6 weeks. You may remove the stockings at night, but should reapply them in the morning. °• Do not drive or operate any equipment until instructed. ° °WOUND CARE:  °• Continue to use the PolarCare or ice packs periodically to reduce pain and swelling. °• You may bathe or shower after the staples are removed at the first office visit following surgery. ° °MEDICATIONS: °• You may resume your regular medications. °• Please take the pain medication as prescribed on the medication. °• Do not take pain medication on an empty stomach. °• You have been given a prescription for a blood thinner (Lovenox or Coumadin). Please take the medication as instructed. (NOTE: After completing a 2 week course of Lovenox, take one Enteric-coated aspirin once a day. This along with elevation will help reduce the possibility of phlebitis in your operated leg.) °• Do not drive or drink alcoholic beverages when taking pain medications. ° °CALL THE OFFICE FOR: °• Temperature above 101 degrees °• Excessive bleeding or drainage on the dressing. °• Excessive swelling, coldness, or paleness of the toes. °• Persistent  nausea and vomiting. ° °FOLLOW-UP:  °• You should have an appointment to return to the office in 10-14 days after surgery. °• Arrangements have been made for continuation of Physical Therapy (either home therapy or outpatient therapy). °  °

## 2017-07-18 ENCOUNTER — Ambulatory Visit: Payer: Medicare Other | Admitting: Anesthesiology

## 2017-07-18 ENCOUNTER — Ambulatory Visit
Admission: RE | Admit: 2017-07-18 | Discharge: 2017-07-18 | Disposition: A | Discharge status: Home / Self Care | Payer: Medicare Other | Source: Ambulatory Visit | Attending: Gastroenterology | Admitting: Gastroenterology

## 2017-07-18 ENCOUNTER — Encounter
Admission: RE | Disposition: A | Discharge status: Home / Self Care | Payer: Self-pay | Source: Ambulatory Visit | Attending: Gastroenterology

## 2017-07-18 DIAGNOSIS — Z888 Allergy status to other drugs, medicaments and biological substances status: Secondary | ICD-10-CM

## 2017-07-18 DIAGNOSIS — K219 Gastro-esophageal reflux disease without esophagitis: Secondary | ICD-10-CM

## 2017-07-18 DIAGNOSIS — K621 Rectal polyp: Secondary | ICD-10-CM | POA: Insufficient documentation

## 2017-07-18 DIAGNOSIS — E785 Hyperlipidemia, unspecified: Secondary | ICD-10-CM

## 2017-07-18 DIAGNOSIS — Z7982 Long term (current) use of aspirin: Secondary | ICD-10-CM | POA: Insufficient documentation

## 2017-07-18 DIAGNOSIS — M47816 Spondylosis without myelopathy or radiculopathy, lumbar region: Secondary | ICD-10-CM

## 2017-07-18 DIAGNOSIS — Z79899 Other long term (current) drug therapy: Secondary | ICD-10-CM | POA: Insufficient documentation

## 2017-07-18 DIAGNOSIS — I2510 Atherosclerotic heart disease of native coronary artery without angina pectoris: Secondary | ICD-10-CM | POA: Insufficient documentation

## 2017-07-18 DIAGNOSIS — Z87891 Personal history of nicotine dependence: Secondary | ICD-10-CM | POA: Insufficient documentation

## 2017-07-18 DIAGNOSIS — Z880 Allergy status to penicillin: Secondary | ICD-10-CM | POA: Insufficient documentation

## 2017-07-18 DIAGNOSIS — I10 Essential (primary) hypertension: Secondary | ICD-10-CM

## 2017-07-18 DIAGNOSIS — Z91041 Radiographic dye allergy status: Secondary | ICD-10-CM

## 2017-07-18 DIAGNOSIS — Z1211 Encounter for screening for malignant neoplasm of colon: Secondary | ICD-10-CM | POA: Insufficient documentation

## 2017-07-18 DIAGNOSIS — K5730 Diverticulosis of large intestine without perforation or abscess without bleeding: Secondary | ICD-10-CM | POA: Insufficient documentation

## 2017-07-18 DIAGNOSIS — Z8546 Personal history of malignant neoplasm of prostate: Secondary | ICD-10-CM | POA: Insufficient documentation

## 2017-07-18 DIAGNOSIS — K640 First degree hemorrhoids: Secondary | ICD-10-CM | POA: Insufficient documentation

## 2017-07-18 DIAGNOSIS — Z882 Allergy status to sulfonamides status: Secondary | ICD-10-CM

## 2017-07-18 DIAGNOSIS — K635 Polyp of colon: Secondary | ICD-10-CM | POA: Insufficient documentation

## 2017-07-18 DIAGNOSIS — Z955 Presence of coronary angioplasty implant and graft: Secondary | ICD-10-CM

## 2017-07-18 HISTORY — PX: COLONOSCOPY WITH PROPOFOL: SHX5780

## 2017-07-18 SURGERY — COLONOSCOPY WITH PROPOFOL
Anesthesia: General

## 2017-07-18 MED ORDER — PROPOFOL 10 MG/ML IV BOLUS
INTRAVENOUS | Status: DC | PRN
  Administered 2017-07-18: 40 mg via INTRAVENOUS
  Administered 2017-07-18: 20 mg via INTRAVENOUS

## 2017-07-18 MED ORDER — LIDOCAINE HCL (CARDIAC) 20 MG/ML IV SOLN
INTRAVENOUS | Status: DC | PRN
  Administered 2017-07-18: 2 mL via INTRAVENOUS

## 2017-07-18 MED ORDER — LIDOCAINE HCL (PF) 2 % IJ SOLN
INTRAMUSCULAR | Status: AC
  Filled 2017-07-18: qty 2

## 2017-07-18 MED ORDER — SODIUM CHLORIDE 0.9 % IV SOLN
INTRAVENOUS | Status: DC
  Administered 2017-07-18: 1000 mL via INTRAVENOUS

## 2017-07-18 MED ORDER — EPHEDRINE SULFATE 50 MG/ML IJ SOLN
INTRAMUSCULAR | Status: DC | PRN
  Administered 2017-07-18: 10 mg via INTRAVENOUS

## 2017-07-18 MED ORDER — PROPOFOL 10 MG/ML IV BOLUS
INTRAVENOUS | Status: AC
  Filled 2017-07-18: qty 20

## 2017-07-18 MED ORDER — EPHEDRINE SULFATE 50 MG/ML IJ SOLN
INTRAMUSCULAR | Status: AC
  Filled 2017-07-18: qty 1

## 2017-07-18 MED ORDER — PROPOFOL 500 MG/50ML IV EMUL
INTRAVENOUS | Status: DC | PRN
  Administered 2017-07-18: 120 ug/kg/min via INTRAVENOUS

## 2017-07-18 MED ORDER — SODIUM CHLORIDE 0.9 % IV SOLN: INTRAVENOUS | Status: DC

## 2017-07-18 NOTE — Anesthesia Postprocedure Evaluation (Signed)
Anesthesia Post Note  Patient: Cody HARDWICK Sr.  Procedure(s) Performed: Procedure(s) (LRB): COLONOSCOPY WITH PROPOFOL (N/A)  Patient location during evaluation: PACU Anesthesia Type: General Level of consciousness: awake Pain management: pain level controlled Vital Signs Assessment: post-procedure vital signs reviewed and stable Respiratory status: nonlabored ventilation Cardiovascular status: stable Anesthetic complications: no     Last Vitals:  Vitals:   07/18/17 1126 07/18/17 1136  BP: 112/81 102/72  Pulse: (!) 59 (!) 45  Resp: 20   Temp:    SpO2: 100% 100%    Last Pain:  Vitals:   07/18/17 0920  TempSrc: Tympanic                 VAN STAVEREN,Jemarion Roycroft

## 2017-07-18 NOTE — Transfer of Care (Signed)
Immediate Anesthesia Transfer of Care Note  Patient: Cody Ellison.  Procedure(s) Performed: Procedure(s): COLONOSCOPY WITH PROPOFOL (N/A)  Patient Location: PACU  Anesthesia Type:General  Level of Consciousness: awake  Airway & Oxygen Therapy: Patient Spontanous Breathing  Post-op Assessment: Report given to RN  Post vital signs: Reviewed  Last Vitals:  Vitals:   07/18/17 0920  BP: (!) 143/86  Pulse: (!) 58  Resp: 16  Temp: (!) 36.2 C  SpO2: 99%    Last Pain:  Vitals:   07/18/17 0920  TempSrc: Tympanic         Complications: No apparent anesthesia complications

## 2017-07-18 NOTE — Anesthesia Post-op Follow-up Note (Signed)
Anesthesia QCDR form completed.        

## 2017-07-18 NOTE — Op Note (Signed)
[facility] Gastroenterology Patient Name: Cody Ellison Procedure Date: 07/18/2017 10:02 AM MRN: [id number] Account #: [id number] Date of Birth: 1951/07/10 Admit Type: Outpatient Age: 66 Room: [facility] ENDO ROOM 1 Gender: Male Note Status: Finalized Procedure:            Colonoscopy Indications:          Screening for colorectal malignant neoplasm Providers:            [name], MD Referring MD:         [name], MD (Referring MD) Medicines:            Monitored Anesthesia Care Complications:        No immediate complications. Procedure:            Pre-Anesthesia Assessment:                       - ASA Grade Assessment: III - A patient with severe                        systemic disease.                       After obtaining informed consent, the colonoscope was                        passed under direct vision. Throughout the procedure,                        the patient's blood pressure, pulse, and oxygen                        saturations were monitored continuously. The                        Colonoscope was introduced through the anus and                        advanced to the the cecum, identified by appendiceal                        orifice and ileocecal valve. The colonoscopy was                        performed without difficulty. The patient tolerated the                        procedure well. The quality of the bowel preparation                        was good. Findings:      A 2 mm polyp was found in the mid sigmoid colon. The polyp was sessile.       The polyp was removed with a cold biopsy forceps. Resection and       retrieval were complete.      A 3 mm polyp was found in the rectum. The polyp was sessile. The polyp       was removed with a cold biopsy forceps. Resection and retrieval were       complete.      Multiple small-mouthed diverticula were found in the sigmoid colon,  descending colon and transverse colon.  Non-bleeding internal hemorrhoids were found during retroflexion and       during anoscopy. The hemorrhoids were small and Grade I (internal       hemorrhoids that do not prolapse).      The digital rectal exam was normal otherwise.      Multiple small localized angioectasias without bleeding were found in       the distal rectum. Impression:           - One 2 mm polyp in the mid sigmoid colon, removed with                        a cold biopsy forceps. Resected and retrieved.                       - One 3 mm polyp in the rectum, removed with a cold                        biopsy forceps. Resected and retrieved.                       - Diverticulosis in the sigmoid colon, in the                        descending colon and in the transverse colon.                       - Non-bleeding internal hemorrhoids. Recommendation:       - Discharge patient to home. Procedure Code(s):    --- Professional ---                       [phone], Colonoscopy, flexible; with biopsy, single or                        multiple Diagnosis Code(s):    --- Professional ---                       Z12.11, Encounter for screening for malignant neoplasm                        of colon                       D12.5, Benign neoplasm of sigmoid colon                       K62.1, Rectal polyp                       K64.0, First degree hemorrhoids                       K57.30, Diverticulosis of large intestine without                        perforation or abscess without bleeding CPT copyright 2016 [employer]. All rights reserved. The codes documented in this report are preliminary and upon coder review may  be revised to meet current compliance requirements. [name], MD 07/18/2017 10:32:12 AM This report has been signed electronically. Number of Addenda: 0 Note Initiated On: 07/18/2017 10:02 AM  Scope Withdrawal Time: 0 hours 10 minutes 8 seconds  Total Procedure Duration: 0 hours 18 minutes 31  seconds       [facility]

## 2017-07-18 NOTE — H&P (Signed)
Outpatient short stay form Pre-procedure 07/18/2017 10:00 AM [name] MD  Primary Physician: Dr. Emily Filbert  Reason for visit:  Colonoscopy  History of present illness:  Patient is a 66 year old male presenting today as above. Colonoscopy was about 10 years ago. He takes a 325 mg aspirin that has been held for at least 5 days.    Current Facility-Administered Medications:  .  0.9 %  sodium chloride infusion, , Intravenous, Continuous, [name], MD, Last Rate: 20 mL/hr at 07/18/17 0933, 1,000 mL at 07/18/17 0933 .  0.9 %  sodium chloride infusion, , Intravenous, Continuous, [name], MD  Prescriptions Prior to Admission  Medication Sig Dispense Refill Last Dose  . acetaminophen (TYLENOL) 500 MG tablet Take 1,000 mg by mouth 2 (two) times daily.    07/18/2017 at 0600  . aspirin EC 325 MG tablet Take by mouth.   Past Week at Unknown time  . aspirin EC 81 MG tablet Take 81 mg by mouth daily.   Past Week at Unknown time  . atenolol (TENORMIN) 100 MG tablet Take 100 mg by mouth every morning.   07/18/2017 at 0600  . Calcium Carbonate-Vit D-Min (CALTRATE 600+D PLUS PO) Take 1 tablet by mouth daily.    Past Week at Unknown time  . Coenzyme Q10 (COQ10) 100 MG CAPS Take 100 mg by mouth daily.   Past Week at Unknown time  . felodipine (PLENDIL) 10 MG 24 hr tablet Take 10 mg by mouth at bedtime. Patient takes at nite   07/17/2017 at Unknown time  . folic acid (FOLVITE) 474 MCG tablet Take 800 mcg by mouth daily.   Past Week at Unknown time  . hydrochlorothiazide (HYDRODIURIL) 25 MG tablet Take 25 mg by mouth as needed.   07/17/2017 at Unknown time  . lisinopril-hydrochlorothiazide (PRINZIDE,ZESTORETIC) 20-12.5 MG per tablet Take 1 tablet by mouth every morning.   07/17/2017 at Unknown time  . niacin 500 MG tablet Take 1,000 mg by mouth 2 (two) times daily with a meal.   Past Week at Unknown time  . omeprazole (PRILOSEC) 20 MG capsule Take 20 mg by mouth daily.   07/18/2017 at  0600  . Phenylephrine-APAP-Guaifenesin (MUCINEX SINUS-MAX CONGESTION) 5-325-200 MG TABS Take 2 tablets by mouth as needed.   Past Week at Unknown time  . simvastatin (ZOCOR) 40 MG tablet Take 40 mg by mouth at bedtime.  3 07/17/2017 at Unknown time  . triamcinolone (NASACORT) 55 MCG/ACT AERO nasal inhaler Place 2 sprays into the nose daily.    07/18/2017 at 0600  . vitamin B-12 (CYANOCOBALAMIN) 1000 MCG tablet Take 1,000 mcg by mouth daily.   Past Week at Unknown time  . vitamin E 400 UNIT capsule Take 400 Units by mouth daily.   Past Week at Unknown time     Allergies  Allergen Reactions  . Ampicillin Other (See Comments)    Raw mouth and throat   . Atorvastatin     Other reaction(s): Unknown  . Carvedilol     Other reaction(s): Dizziness  . Contrast Media [Iodinated Diagnostic Agents] Hives  . Diltiazem Hcl Other (See Comments)  . Fluvastatin Other (See Comments)  . Lovastatin Other (See Comments)  . Sulfa Antibiotics Hives  . Betadine [Povidone Iodine] Rash     Past Medical History:  Diagnosis Date  . Arthritis    lower back   . Cancer [facility])    prostate cancer   . Coronary artery disease    stent RCA-  2000 and 12/2009   . GERD (gastroesophageal reflux disease)   . Hyperlipidemia   . Hypertension     Review of systems:      Physical Exam    Heart and lungs: Regular rate and rhythm without rub or gallop, lungs are bilaterally clear.    HEENT: Normocephalic atraumatic eyes are anicteric    Other:     Pertinant exam for procedure: Soft nontender nondistended bowel sounds positive normoactive.    Planned proceedures: Colonoscopy and indicated procedures. I have discussed the risks benefits and complications of procedures to include not limited to bleeding, infection, perforation and the risk of sedation and the patient wishes to proceed.    [name], MD Gastroenterology 07/18/2017  10:00 AM

## 2017-07-18 NOTE — Anesthesia Preprocedure Evaluation (Signed)
Anesthesia Evaluation  Patient identified by MRN, date of birth, ID band Patient awake    Reviewed: Allergy & Precautions, NPO status , Patient's Chart, lab work & pertinent test results  Airway Mallampati: II       Dental  (+) Teeth Intact   Pulmonary neg pulmonary ROS, former smoker,    breath sounds clear to auscultation       Cardiovascular Exercise Tolerance: Good hypertension, Pt. on medications and Pt. on home beta blockers + CAD and + Cardiac Stents   Rhythm:Regular Rate:Normal     Neuro/Psych negative psych ROS   GI/Hepatic Neg liver ROS, GERD  Medicated,  Endo/Other  negative endocrine ROS  Renal/GU negative Renal ROS  negative genitourinary   Musculoskeletal   Abdominal Normal abdominal exam  (+)   Peds negative pediatric ROS (+)  Hematology negative hematology ROS (+)   Anesthesia Other Findings   Reproductive/Obstetrics                             Anesthesia Physical Anesthesia Plan  ASA: II  Anesthesia Plan: General   Post-op Pain Management:    Induction: Intravenous  PONV Risk Score and Plan: 0  Airway Management Planned: Natural Airway and Nasal Cannula  Additional Equipment:   Intra-op Plan:   Post-operative Plan:   Informed Consent: I have reviewed the patients History and Physical, chart, labs and discussed the procedure including the risks, benefits and alternatives for the proposed anesthesia with the patient or authorized representative who has indicated his/her understanding and acceptance.     Plan Discussed with: Surgeon  Anesthesia Plan Comments:         Anesthesia Quick Evaluation

## 2017-07-19 ENCOUNTER — Encounter: Payer: Self-pay | Admitting: Gastroenterology

## 2017-07-19 LAB — SURGICAL PATHOLOGY

## 2017-07-20 ENCOUNTER — Inpatient Hospital Stay: Payer: Medicare Other

## 2017-07-20 ENCOUNTER — Inpatient Hospital Stay: Payer: Medicare Other | Admitting: Anesthesiology

## 2017-07-20 ENCOUNTER — Encounter: Payer: Self-pay | Admitting: Orthopedic Surgery

## 2017-07-20 ENCOUNTER — Encounter
Admission: RE | Disposition: A | Discharge status: Home / Self Care | Payer: Self-pay | Source: Ambulatory Visit | Attending: Orthopedic Surgery

## 2017-07-20 ENCOUNTER — Inpatient Hospital Stay
Admission: RE | Admit: 2017-07-20 | Discharge: 2017-07-22 | DRG: 470 | Disposition: A | Discharge status: Home / Self Care | Payer: Medicare Other | Source: Ambulatory Visit | Attending: Orthopedic Surgery | Admitting: Orthopedic Surgery

## 2017-07-20 DIAGNOSIS — Z7982 Long term (current) use of aspirin: Secondary | ICD-10-CM | POA: Diagnosis not present

## 2017-07-20 DIAGNOSIS — Z8041 Family history of malignant neoplasm of ovary: Secondary | ICD-10-CM

## 2017-07-20 DIAGNOSIS — M1712 Unilateral primary osteoarthritis, left knee: Secondary | ICD-10-CM | POA: Diagnosis present

## 2017-07-20 DIAGNOSIS — M25762 Osteophyte, left knee: Secondary | ICD-10-CM | POA: Diagnosis present

## 2017-07-20 DIAGNOSIS — K219 Gastro-esophageal reflux disease without esophagitis: Secondary | ICD-10-CM | POA: Diagnosis present

## 2017-07-20 DIAGNOSIS — Z79899 Other long term (current) drug therapy: Secondary | ICD-10-CM

## 2017-07-20 DIAGNOSIS — I10 Essential (primary) hypertension: Secondary | ICD-10-CM | POA: Diagnosis present

## 2017-07-20 DIAGNOSIS — Z8546 Personal history of malignant neoplasm of prostate: Secondary | ICD-10-CM | POA: Diagnosis not present

## 2017-07-20 DIAGNOSIS — Z96652 Presence of left artificial knee joint: Secondary | ICD-10-CM

## 2017-07-20 DIAGNOSIS — Z882 Allergy status to sulfonamides status: Secondary | ICD-10-CM

## 2017-07-20 DIAGNOSIS — Z87891 Personal history of nicotine dependence: Secondary | ICD-10-CM

## 2017-07-20 DIAGNOSIS — E785 Hyperlipidemia, unspecified: Secondary | ICD-10-CM | POA: Diagnosis present

## 2017-07-20 DIAGNOSIS — Z888 Allergy status to other drugs, medicaments and biological substances status: Secondary | ICD-10-CM

## 2017-07-20 DIAGNOSIS — Z923 Personal history of irradiation: Secondary | ICD-10-CM | POA: Diagnosis not present

## 2017-07-20 DIAGNOSIS — Z23 Encounter for immunization: Secondary | ICD-10-CM | POA: Diagnosis not present

## 2017-07-20 DIAGNOSIS — Z881 Allergy status to other antibiotic agents status: Secondary | ICD-10-CM

## 2017-07-20 DIAGNOSIS — Z8249 Family history of ischemic heart disease and other diseases of the circulatory system: Secondary | ICD-10-CM | POA: Diagnosis not present

## 2017-07-20 DIAGNOSIS — Z955 Presence of coronary angioplasty implant and graft: Secondary | ICD-10-CM | POA: Diagnosis not present

## 2017-07-20 DIAGNOSIS — I2510 Atherosclerotic heart disease of native coronary artery without angina pectoris: Secondary | ICD-10-CM | POA: Diagnosis present

## 2017-07-20 DIAGNOSIS — Z96659 Presence of unspecified artificial knee joint: Secondary | ICD-10-CM

## 2017-07-20 HISTORY — PX: KNEE ARTHROPLASTY: SHX992

## 2017-07-20 LAB — ABO/RH: ABO/RH(D): O POS

## 2017-07-20 IMAGING — DX DG KNEE 1-2V PORT*L*
2 series · 2 of 2 positions shown · non-contrast
Comparison: None.

CLINICAL DATA: S post left knee replacement

EXAM:
PORTABLE LEFT KNEE - 1-2 VIEW

[knee ap]
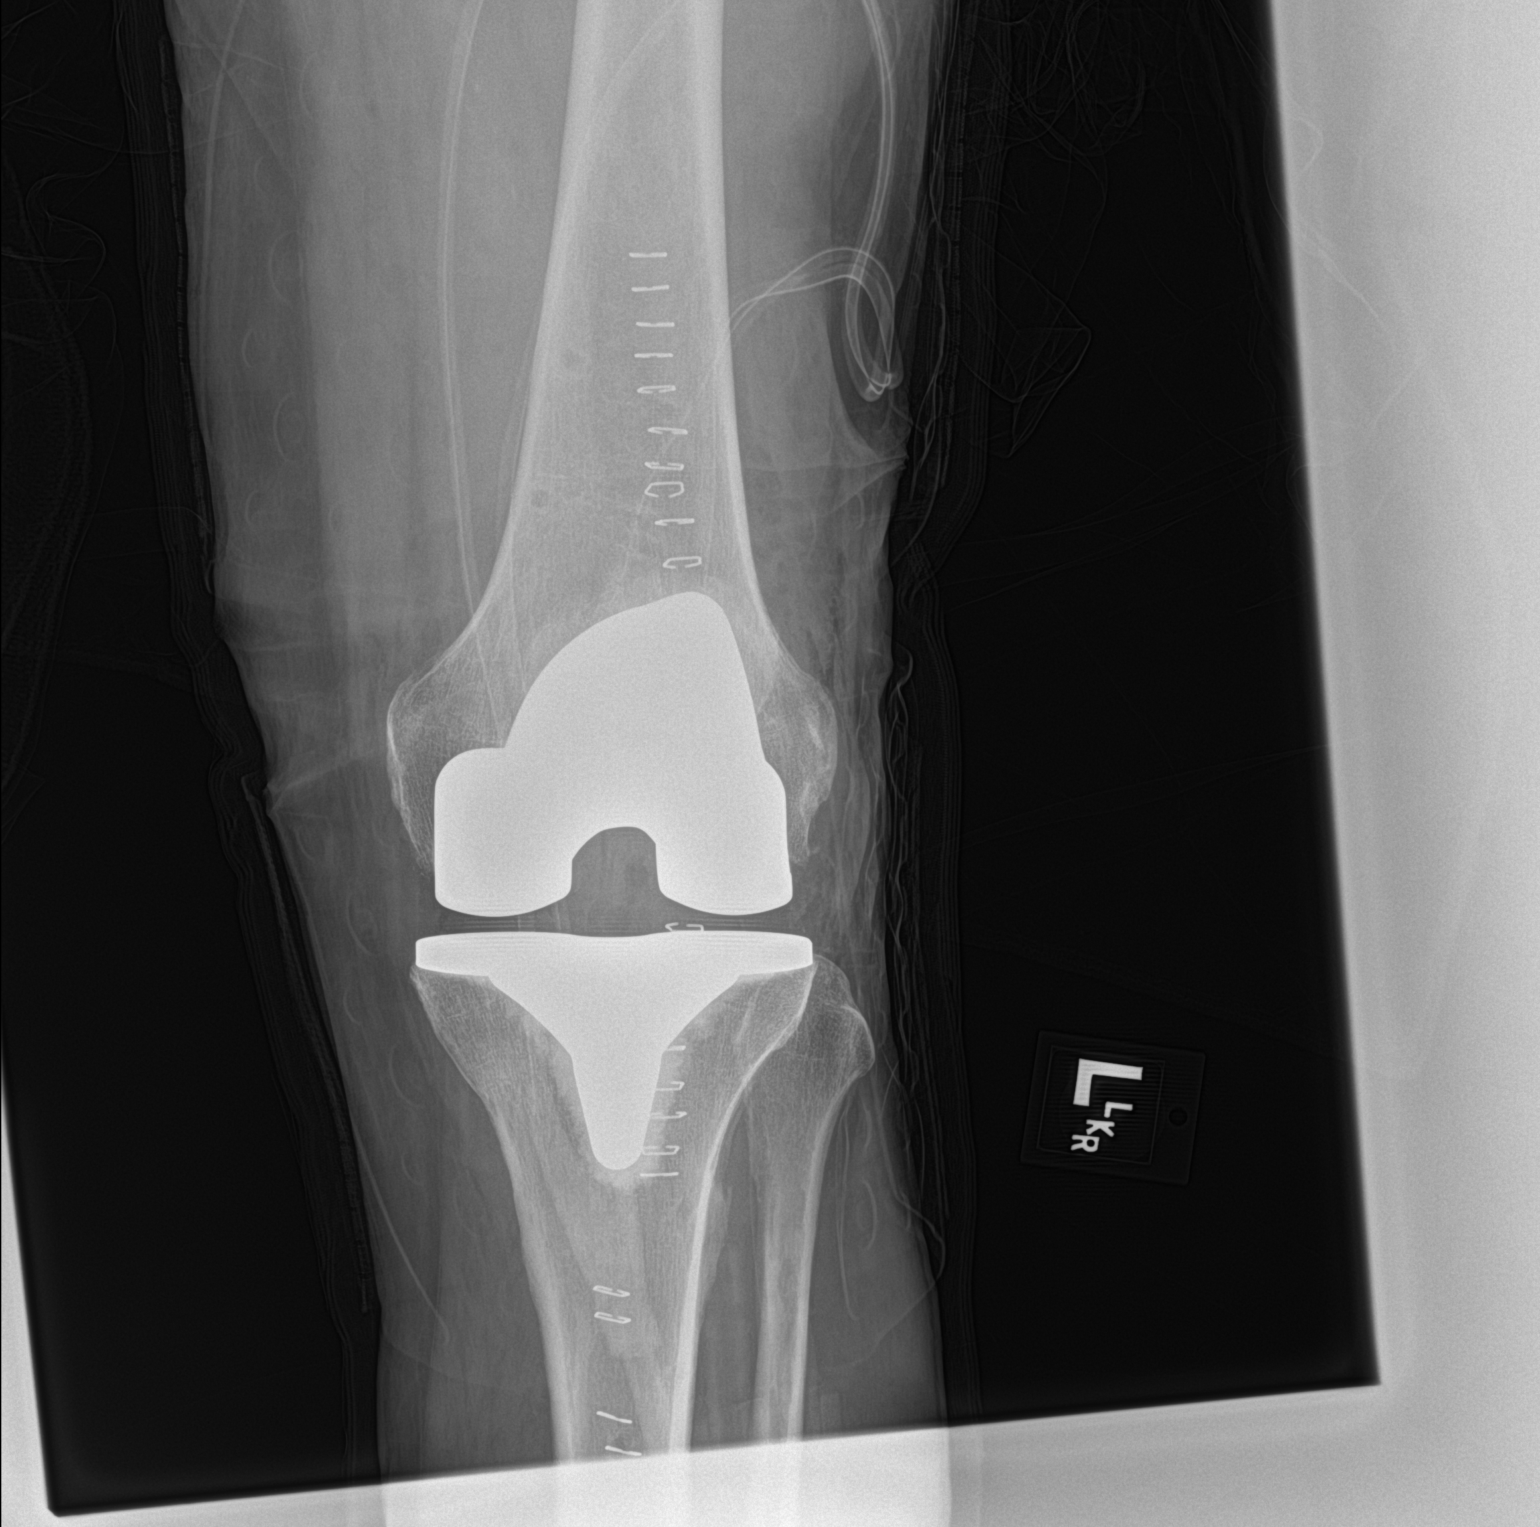

[knee lat]
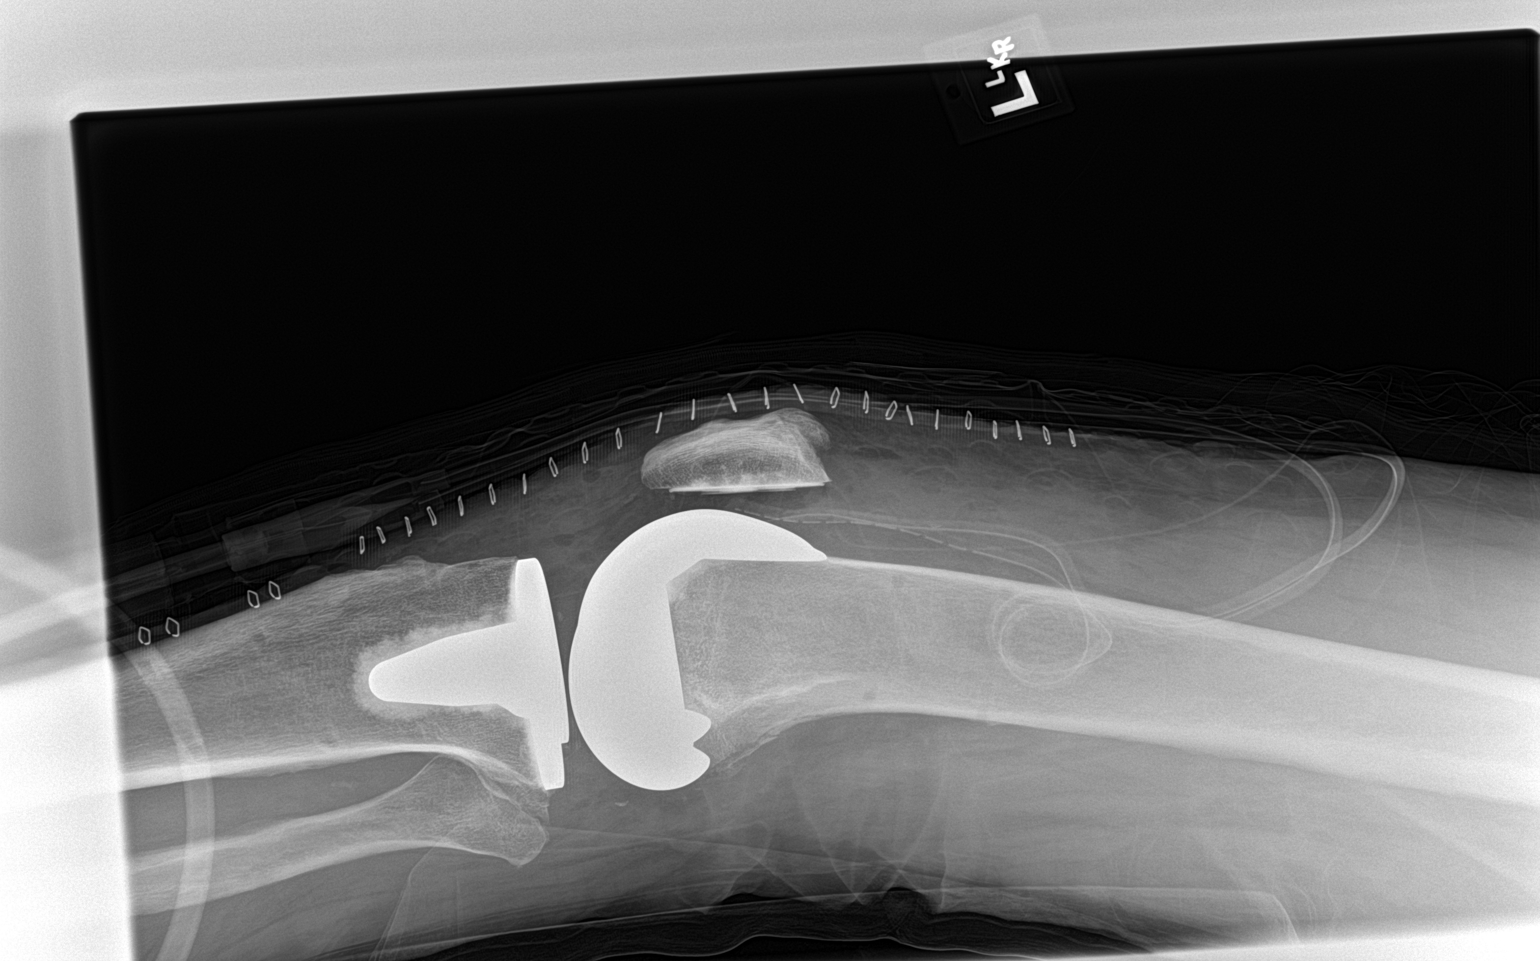

[2 of 2 positions shown; findings below may reference images not displayed]

FINDINGS: Left knee replacement is noted. Surgical drains are seen. No soft
tissue abnormality is noted. No acute bony abnormality is noted.
IMPRESSION: Status post left knee replacement.

## 2017-07-20 SURGERY — ARTHROPLASTY, KNEE, TOTAL, USING IMAGELESS COMPUTER-ASSISTED NAVIGATION
Anesthesia: Spinal | Laterality: Left | Wound class: Clean

## 2017-07-20 MED ORDER — FENTANYL CITRATE (PF) 100 MCG/2ML IJ SOLN
INTRAMUSCULAR | Status: DC | PRN
  Administered 2017-07-20 (×2): 50 ug via INTRAVENOUS

## 2017-07-20 MED ORDER — SODIUM CHLORIDE 0.9 % IJ SOLN
INTRAMUSCULAR | Status: AC
  Filled 2017-07-20: qty 50

## 2017-07-20 MED ORDER — DIPHENHYDRAMINE HCL 50 MG/ML IJ SOLN
INTRAMUSCULAR | Status: AC
Start: 2017-07-20 — End: 2017-07-20
  Filled 2017-07-20: qty 1

## 2017-07-20 MED ORDER — TRIAMCINOLONE ACETONIDE 55 MCG/ACT NA AERO: 2.0000 | INHALATION_SPRAY | Freq: Every day | NASAL | Status: DC

## 2017-07-20 MED ORDER — FENTANYL CITRATE (PF) 100 MCG/2ML IJ SOLN
INTRAMUSCULAR | Status: AC
  Filled 2017-07-20: qty 2

## 2017-07-20 MED ORDER — FERROUS SULFATE 325 (65 FE) MG PO TABS
325.0000 mg | ORAL_TABLET | Freq: Two times a day (BID) | ORAL | Status: DC
  Administered 2017-07-22: 325 mg via ORAL
  Filled 2017-07-20: qty 1

## 2017-07-20 MED ORDER — MIDAZOLAM HCL 5 MG/5ML IJ SOLN
INTRAMUSCULAR | Status: DC | PRN
  Administered 2017-07-20: 2 mg via INTRAVENOUS

## 2017-07-20 MED ORDER — NEOMYCIN-POLYMYXIN B GU 40-200000 IR SOLN
Status: AC
  Filled 2017-07-20: qty 20

## 2017-07-20 MED ORDER — ATENOLOL 25 MG PO TABS
100.0000 mg | ORAL_TABLET | Freq: Every morning | ORAL | Status: DC
  Administered 2017-07-21 – 2017-07-22 (×2): 100 mg via ORAL
  Filled 2017-07-20 (×2): qty 4

## 2017-07-20 MED ORDER — PROPOFOL 500 MG/50ML IV EMUL
INTRAVENOUS | Status: DC | PRN
  Administered 2017-07-20: 50 ug/kg/min via INTRAVENOUS

## 2017-07-20 MED ORDER — ONDANSETRON HCL 4 MG/2ML IJ SOLN
INTRAMUSCULAR | Status: DC | PRN
  Administered 2017-07-20: 4 mg via INTRAVENOUS

## 2017-07-20 MED ORDER — ACETAMINOPHEN 10 MG/ML IV SOLN
1000.0000 mg | Freq: Four times a day (QID) | INTRAVENOUS | Status: AC
  Administered 2017-07-20 – 2017-07-21 (×4): 1000 mg via INTRAVENOUS
  Filled 2017-07-20 (×4): qty 100

## 2017-07-20 MED ORDER — DIPHENHYDRAMINE HCL 12.5 MG/5ML PO ELIX: 12.5000 mg | ORAL_SOLUTION | ORAL | Status: DC | PRN

## 2017-07-20 MED ORDER — FLUTICASONE PROPIONATE 50 MCG/ACT NA SUSP
2.0000 | Freq: Every day | NASAL | Status: DC
  Administered 2017-07-21 – 2017-07-22 (×2): 2 via NASAL
  Filled 2017-07-20: qty 16

## 2017-07-20 MED ORDER — ONDANSETRON HCL 4 MG/2ML IJ SOLN: 4.0000 mg | Freq: Four times a day (QID) | INTRAMUSCULAR | Status: DC | PRN

## 2017-07-20 MED ORDER — ENOXAPARIN SODIUM 30 MG/0.3ML ~~LOC~~ SOLN
30.0000 mg | Freq: Two times a day (BID) | SUBCUTANEOUS | Status: DC
  Administered 2017-07-21 – 2017-07-22 (×3): 30 mg via SUBCUTANEOUS
  Filled 2017-07-20 (×3): qty 0.3

## 2017-07-20 MED ORDER — MORPHINE SULFATE (PF) 2 MG/ML IV SOLN: 2.0000 mg | INTRAVENOUS | Status: DC | PRN

## 2017-07-20 MED ORDER — ACETAMINOPHEN 325 MG PO TABS: 650.0000 mg | ORAL_TABLET | Freq: Four times a day (QID) | ORAL | Status: DC | PRN

## 2017-07-20 MED ORDER — TRANEXAMIC ACID 1000 MG/10ML IV SOLN
INTRAVENOUS | Status: DC | PRN
  Administered 2017-07-20: 1000 mg via INTRAVENOUS

## 2017-07-20 MED ORDER — TRANEXAMIC ACID 1000 MG/10ML IV SOLN
1000.0000 mg | Freq: Once | INTRAVENOUS | Status: AC
  Administered 2017-07-20: 1000 mg via INTRAVENOUS
  Filled 2017-07-20: qty 10

## 2017-07-20 MED ORDER — ONDANSETRON HCL 4 MG PO TABS: 4.0000 mg | ORAL_TABLET | Freq: Four times a day (QID) | ORAL | Status: DC | PRN

## 2017-07-20 MED ORDER — INFLUENZA VAC SPLIT HIGH-DOSE 0.5 ML IM SUSY
0.5000 mL | PREFILLED_SYRINGE | INTRAMUSCULAR | Status: AC
  Administered 2017-07-21: 0.5 mL via INTRAMUSCULAR
  Filled 2017-07-20 (×2): qty 0.5

## 2017-07-20 MED ORDER — METOCLOPRAMIDE HCL 10 MG PO TABS
10.0000 mg | ORAL_TABLET | Freq: Three times a day (TID) | ORAL | Status: DC
  Administered 2017-07-20 – 2017-07-22 (×6): 10 mg via ORAL
  Filled 2017-07-20 (×6): qty 1

## 2017-07-20 MED ORDER — FOLIC ACID 1 MG PO TABS
1000.0000 ug | ORAL_TABLET | Freq: Every day | ORAL | Status: DC
  Filled 2017-07-20: qty 1

## 2017-07-20 MED ORDER — SENNOSIDES-DOCUSATE SODIUM 8.6-50 MG PO TABS
1.0000 | ORAL_TABLET | Freq: Two times a day (BID) | ORAL | Status: DC
  Administered 2017-07-20 – 2017-07-22 (×4): 1 via ORAL
  Filled 2017-07-20 (×4): qty 1

## 2017-07-20 MED ORDER — HYDROCHLOROTHIAZIDE 25 MG PO TABS
25.0000 mg | ORAL_TABLET | Freq: Every day | ORAL | Status: DC
  Administered 2017-07-22: 25 mg via ORAL
  Filled 2017-07-20 (×3): qty 1

## 2017-07-20 MED ORDER — NIACIN 500 MG PO TABS
1000.0000 mg | ORAL_TABLET | Freq: Two times a day (BID) | ORAL | Status: DC
  Filled 2017-07-20 (×5): qty 2

## 2017-07-20 MED ORDER — TRAMADOL HCL 50 MG PO TABS
50.0000 mg | ORAL_TABLET | ORAL | Status: DC | PRN
  Administered 2017-07-20 – 2017-07-21 (×2): 50 mg via ORAL
  Administered 2017-07-21 – 2017-07-22 (×3): 100 mg via ORAL
  Filled 2017-07-20 (×3): qty 2
  Filled 2017-07-20 (×2): qty 1

## 2017-07-20 MED ORDER — PHENOL 1.4 % MT LIQD
1.0000 | OROMUCOSAL | Status: DC | PRN
  Filled 2017-07-20: qty 177

## 2017-07-20 MED ORDER — ASPIRIN EC 81 MG PO TBEC
81.0000 mg | DELAYED_RELEASE_TABLET | Freq: Every day | ORAL | Status: DC
  Administered 2017-07-21 – 2017-07-22 (×2): 81 mg via ORAL
  Filled 2017-07-20 (×2): qty 1

## 2017-07-20 MED ORDER — BUPIVACAINE HCL (PF) 0.5 % IJ SOLN
INTRAMUSCULAR | Status: DC | PRN
  Administered 2017-07-20: 3 mL

## 2017-07-20 MED ORDER — LACTATED RINGERS IV SOLN
INTRAVENOUS | Status: DC
  Administered 2017-07-20 (×2): via INTRAVENOUS

## 2017-07-20 MED ORDER — CALCIUM CARBONATE-VITAMIN D 500-200 MG-UNIT PO TABS
1.0000 | ORAL_TABLET | Freq: Every day | ORAL | Status: DC
  Administered 2017-07-22: 1 via ORAL
  Filled 2017-07-20: qty 1

## 2017-07-20 MED ORDER — SIMVASTATIN 20 MG PO TABS
40.0000 mg | ORAL_TABLET | Freq: Every day | ORAL | Status: DC
  Administered 2017-07-20 – 2017-07-21 (×2): 40 mg via ORAL
  Filled 2017-07-20 (×2): qty 2

## 2017-07-20 MED ORDER — SODIUM CHLORIDE 0.9 % IV SOLN
INTRAVENOUS | Status: DC
  Administered 2017-07-20: 18:00:00 via INTRAVENOUS

## 2017-07-20 MED ORDER — SODIUM CHLORIDE 0.9 % IV SOLN
INTRAVENOUS | Status: DC | PRN
  Administered 2017-07-20: 60 mL

## 2017-07-20 MED ORDER — LISINOPRIL 20 MG PO TABS
20.0000 mg | ORAL_TABLET | Freq: Every day | ORAL | Status: DC
  Administered 2017-07-20 – 2017-07-22 (×3): 20 mg via ORAL
  Filled 2017-07-20 (×3): qty 1

## 2017-07-20 MED ORDER — VITAMIN E 180 MG (400 UNIT) PO CAPS
400.0000 [IU] | ORAL_CAPSULE | Freq: Every day | ORAL | Status: DC
  Administered 2017-07-22: 400 [IU] via ORAL
  Filled 2017-07-20 (×3): qty 1

## 2017-07-20 MED ORDER — PROPOFOL 500 MG/50ML IV EMUL
INTRAVENOUS | Status: AC
  Filled 2017-07-20: qty 50

## 2017-07-20 MED ORDER — OXYCODONE HCL 5 MG PO TABS
5.0000 mg | ORAL_TABLET | ORAL | Status: DC | PRN
  Administered 2017-07-20 – 2017-07-21 (×4): 10 mg via ORAL
  Administered 2017-07-21: 5 mg via ORAL
  Administered 2017-07-21 – 2017-07-22 (×4): 10 mg via ORAL
  Filled 2017-07-20 (×2): qty 2
  Filled 2017-07-20: qty 1
  Filled 2017-07-20 (×6): qty 2

## 2017-07-20 MED ORDER — CLINDAMYCIN PHOSPHATE 900 MG/50ML IV SOLN: 900.0000 mg | INTRAVENOUS | Status: DC

## 2017-07-20 MED ORDER — ACETAMINOPHEN 10 MG/ML IV SOLN
INTRAVENOUS | Status: DC | PRN
  Administered 2017-07-20: 1000 mg via INTRAVENOUS

## 2017-07-20 MED ORDER — MENTHOL 3 MG MT LOZG
1.0000 | LOZENGE | OROMUCOSAL | Status: DC | PRN
  Filled 2017-07-20: qty 9

## 2017-07-20 MED ORDER — SODIUM CHLORIDE 0.9 % IV SOLN
1000.0000 mg | INTRAVENOUS | Status: DC
  Filled 2017-07-20: qty 10

## 2017-07-20 MED ORDER — DIPHENHYDRAMINE HCL 50 MG/ML IJ SOLN
INTRAMUSCULAR | Status: DC | PRN
  Administered 2017-07-20: 25 mg via INTRAVENOUS

## 2017-07-20 MED ORDER — BUPIVACAINE LIPOSOME 1.3 % IJ SUSP
INTRAMUSCULAR | Status: AC
  Filled 2017-07-20: qty 20

## 2017-07-20 MED ORDER — FLEET ENEMA 7-19 GM/118ML RE ENEM: 1.0000 | ENEMA | Freq: Once | RECTAL | Status: DC | PRN

## 2017-07-20 MED ORDER — FENTANYL CITRATE (PF) 100 MCG/2ML IJ SOLN: 25.0000 ug | INTRAMUSCULAR | Status: DC | PRN

## 2017-07-20 MED ORDER — CLINDAMYCIN PHOSPHATE 900 MG/50ML IV SOLN
INTRAVENOUS | Status: DC | PRN
  Administered 2017-07-20: 900 mg via INTRAVENOUS

## 2017-07-20 MED ORDER — BISACODYL 10 MG RE SUPP
10.0000 mg | Freq: Every day | RECTAL | Status: DC | PRN
  Administered 2017-07-22: 10 mg via RECTAL
  Filled 2017-07-20: qty 1

## 2017-07-20 MED ORDER — ACETAMINOPHEN 10 MG/ML IV SOLN
INTRAVENOUS | Status: AC
  Filled 2017-07-20: qty 100

## 2017-07-20 MED ORDER — MAGNESIUM HYDROXIDE 400 MG/5ML PO SUSP
30.0000 mL | Freq: Every day | ORAL | Status: DC | PRN
  Administered 2017-07-21: 30 mL via ORAL
  Filled 2017-07-20: qty 30

## 2017-07-20 MED ORDER — FELODIPINE ER 5 MG PO TB24
10.0000 mg | ORAL_TABLET | Freq: Every day | ORAL | Status: DC
  Administered 2017-07-20 – 2017-07-21 (×2): 10 mg via ORAL
  Filled 2017-07-20: qty 1
  Filled 2017-07-20: qty 2
  Filled 2017-07-20: qty 1

## 2017-07-20 MED ORDER — LISINOPRIL-HYDROCHLOROTHIAZIDE 20-12.5 MG PO TABS: 1.0000 | ORAL_TABLET | Freq: Every morning | ORAL | Status: DC

## 2017-07-20 MED ORDER — VITAMIN B-12 1000 MCG PO TABS
1000.0000 ug | ORAL_TABLET | Freq: Every day | ORAL | Status: DC
  Administered 2017-07-22: 1000 ug via ORAL
  Filled 2017-07-20: qty 1

## 2017-07-20 MED ORDER — BUPIVACAINE HCL (PF) 0.25 % IJ SOLN
INTRAMUSCULAR | Status: AC
  Filled 2017-07-20: qty 60

## 2017-07-20 MED ORDER — MIDAZOLAM HCL 2 MG/2ML IJ SOLN
INTRAMUSCULAR | Status: AC
  Filled 2017-07-20: qty 2

## 2017-07-20 MED ORDER — CLINDAMYCIN PHOSPHATE 600 MG/50ML IV SOLN
600.0000 mg | Freq: Four times a day (QID) | INTRAVENOUS | Status: AC
  Administered 2017-07-20 – 2017-07-21 (×4): 600 mg via INTRAVENOUS
  Filled 2017-07-20 (×4): qty 50

## 2017-07-20 MED ORDER — ONDANSETRON HCL 4 MG/2ML IJ SOLN: 4.0000 mg | Freq: Once | INTRAMUSCULAR | Status: DC | PRN

## 2017-07-20 MED ORDER — BUPIVACAINE HCL (PF) 0.25 % IJ SOLN
INTRAMUSCULAR | Status: DC | PRN
  Administered 2017-07-20: 30 mL

## 2017-07-20 MED ORDER — ACETAMINOPHEN 650 MG RE SUPP
650.0000 mg | Freq: Four times a day (QID) | RECTAL | Status: DC | PRN
Start: 2017-07-20 — End: 2017-07-22

## 2017-07-20 MED ORDER — PANTOPRAZOLE SODIUM 40 MG PO TBEC
40.0000 mg | DELAYED_RELEASE_TABLET | Freq: Two times a day (BID) | ORAL | Status: DC
  Administered 2017-07-20 – 2017-07-22 (×4): 40 mg via ORAL
  Filled 2017-07-20 (×4): qty 1

## 2017-07-20 MED ORDER — CLINDAMYCIN PHOSPHATE 900 MG/50ML IV SOLN
INTRAVENOUS | Status: AC
  Filled 2017-07-20: qty 50

## 2017-07-20 MED ORDER — PHENYLEPHRINE HCL 10 MG/ML IJ SOLN
INTRAMUSCULAR | Status: DC | PRN
  Administered 2017-07-20: 10 ug/min via INTRAVENOUS

## 2017-07-20 MED ORDER — HYDROCHLOROTHIAZIDE 12.5 MG PO CAPS
12.5000 mg | ORAL_CAPSULE | Freq: Every day | ORAL | Status: DC
  Administered 2017-07-20 – 2017-07-22 (×3): 12.5 mg via ORAL
  Filled 2017-07-20 (×3): qty 1

## 2017-07-20 MED ORDER — ALUM & MAG HYDROXIDE-SIMETH 200-200-20 MG/5ML PO SUSP: 30.0000 mL | ORAL | Status: DC | PRN

## 2017-07-20 SURGICAL SUPPLY — 62 items
BATTERY INSTRU NAVIGATION (MISCELLANEOUS) ×12 IMPLANT
BLADE SAW 1 (BLADE) ×3 IMPLANT
BLADE SAW 1/2 (BLADE) ×3 IMPLANT
BLADE SAW 70X12.5 (BLADE) IMPLANT
CANISTER SUCT 1200ML W/VALVE (MISCELLANEOUS) ×3 IMPLANT
CANISTER SUCT 3000ML PPV (MISCELLANEOUS) ×6 IMPLANT
CAPT KNEE TOTAL 3 ATTUNE ×3 IMPLANT
CATH TRAY METER 16FR LF (MISCELLANEOUS) ×3 IMPLANT
CEMENT HV SMART SET (Cement) ×6 IMPLANT
COOLER POLAR GLACIER W/PUMP (MISCELLANEOUS) ×3 IMPLANT
CUFF TOURN 24 STER (MISCELLANEOUS) IMPLANT
CUFF TOURN 30 STER DUAL PORT (MISCELLANEOUS) IMPLANT
DRAPE SHEET LG 3/4 BI-LAMINATE (DRAPES) ×3 IMPLANT
DRSG DERMACEA 8X12 NADH (GAUZE/BANDAGES/DRESSINGS) ×3 IMPLANT
DRSG OPSITE POSTOP 4X14 (GAUZE/BANDAGES/DRESSINGS) ×3 IMPLANT
DRSG TEGADERM 4X4.75 (GAUZE/BANDAGES/DRESSINGS) ×3 IMPLANT
DURAPREP 26ML APPLICATOR (WOUND CARE) ×6 IMPLANT
ELECT CAUTERY BLADE 6.4 (BLADE) ×3 IMPLANT
ELECT REM PT RETURN 9FT ADLT (ELECTROSURGICAL) ×3
ELECTRODE REM PT RTRN 9FT ADLT (ELECTROSURGICAL) ×1 IMPLANT
EVACUATOR 1/8 PVC DRAIN (DRAIN) ×3 IMPLANT
EX-PIN ORTHOLOCK NAV 4X150 (PIN) ×6 IMPLANT
GLOVE BIOGEL M STRL SZ7.5 (GLOVE) ×6 IMPLANT
GLOVE BIOGEL PI IND STRL 9 (GLOVE) ×1 IMPLANT
GLOVE BIOGEL PI INDICATOR 9 (GLOVE) ×2
GLOVE INDICATOR 8.0 STRL GRN (GLOVE) ×3 IMPLANT
GLOVE SURG SYN 9.0  PF PI (GLOVE) ×2
GLOVE SURG SYN 9.0 PF PI (GLOVE) ×1 IMPLANT
GOWN STRL REUS W/ TWL LRG LVL3 (GOWN DISPOSABLE) ×2 IMPLANT
GOWN STRL REUS W/TWL 2XL LVL3 (GOWN DISPOSABLE) ×3 IMPLANT
GOWN STRL REUS W/TWL LRG LVL3 (GOWN DISPOSABLE) ×4
HOLDER FOLEY CATH W/STRAP (MISCELLANEOUS) ×3 IMPLANT
HOOD PEEL AWAY FLYTE STAYCOOL (MISCELLANEOUS) ×6 IMPLANT
KIT RM TURNOVER STRD PROC AR (KITS) ×3 IMPLANT
KNIFE SCULPS 14X20 (INSTRUMENTS) ×3 IMPLANT
LABEL OR SOLS (LABEL) ×3 IMPLANT
NDL SAFETY 18GX1.5 (NEEDLE) ×3 IMPLANT
NEEDLE SPNL 20GX3.5 QUINCKE YW (NEEDLE) ×3 IMPLANT
NS IRRIG 500ML POUR BTL (IV SOLUTION) ×3 IMPLANT
PACK TOTAL KNEE (MISCELLANEOUS) ×3 IMPLANT
PAD WRAPON POLAR KNEE (MISCELLANEOUS) ×1 IMPLANT
PIN DRILL QUICK PACK ×3 IMPLANT
PIN FIXATION 1/8DIA X 3INL (PIN) ×3 IMPLANT
PULSAVAC PLUS IRRIG FAN TIP (DISPOSABLE) ×3
SOL .9 NS 3000ML IRR  AL (IV SOLUTION) ×2
SOL .9 NS 3000ML IRR UROMATIC (IV SOLUTION) ×1 IMPLANT
SOL PREP PVP 2OZ (MISCELLANEOUS) ×3
SOLUTION PREP PVP 2OZ (MISCELLANEOUS) ×1 IMPLANT
SPONGE DRAIN TRACH 4X4 STRL 2S (GAUZE/BANDAGES/DRESSINGS) ×3 IMPLANT
STAPLER SKIN PROX 35W (STAPLE) ×3 IMPLANT
STRAP TIBIA SHORT (MISCELLANEOUS) ×3 IMPLANT
SUCTION FRAZIER HANDLE 10FR (MISCELLANEOUS) ×2
SUCTION TUBE FRAZIER 10FR DISP (MISCELLANEOUS) ×1 IMPLANT
SUT VIC AB 0 CT1 36 (SUTURE) ×3 IMPLANT
SUT VIC AB 1 CT1 36 (SUTURE) ×6 IMPLANT
SUT VIC AB 2-0 CT2 27 (SUTURE) ×3 IMPLANT
SYR 20CC LL (SYRINGE) ×3 IMPLANT
SYR 30ML LL (SYRINGE) ×6 IMPLANT
TIP FAN IRRIG PULSAVAC PLUS (DISPOSABLE) ×1 IMPLANT
TOWEL OR 17X26 4PK STRL BLUE (TOWEL DISPOSABLE) ×3 IMPLANT
TOWER CARTRIDGE SMART MIX (DISPOSABLE) ×3 IMPLANT
WRAPON POLAR PAD KNEE (MISCELLANEOUS) ×3

## 2017-07-20 NOTE — Anesthesia Procedure Notes (Addendum)
Spinal  Patient location during procedure: OR Start time: 07/20/2017 12:40 PM End time: 07/20/2017 12:45 PM Staffing Resident/CRNA: Nelda Marseille Performed: resident/CRNA  Preanesthetic Checklist Completed: patient identified, site marked, surgical consent, pre-op evaluation, timeout performed, IV checked, risks and benefits discussed and monitors and equipment checked Spinal Block Patient position: sitting Prep: Betadine Patient monitoring: heart rate, continuous pulse ox, blood pressure and cardiac monitor Approach: right paramedian Location: L3-4 Injection technique: single-shot Needle Needle type: Whitacre and Introducer  Needle gauge: 25 G Needle length: 9 cm Assessment Sensory level: T10 Additional Notes Negative paresthesia. Negative blood return. Positive free-flowing CSF. Expiration date of kit checked and confirmed. Patient tolerated procedure well, without complications.

## 2017-07-20 NOTE — OR Nursing (Signed)
Dr. Marcello Moores from anesthesia of coffee.

## 2017-07-20 NOTE — Anesthesia Preprocedure Evaluation (Addendum)
Anesthesia Evaluation  Patient identified by MRN, date of birth, ID band Patient awake    Reviewed: Allergy & Precautions, NPO status , Patient's Chart, lab work & pertinent test results, reviewed documented beta blocker date and time   Airway Mallampati: II  TM Distance: >3 FB     Dental  (+) Chipped, Missing   Pulmonary former smoker,           Cardiovascular hypertension, Pt. on medications and Pt. on home beta blockers + CAD and + Cardiac Stents       Neuro/Psych    GI/Hepatic GERD  Controlled,  Endo/Other    Renal/GU      Musculoskeletal  (+) Arthritis ,   Abdominal   Peds  Hematology   Anesthesia Other Findings Allergic to betadine.  Reproductive/Obstetrics                            Anesthesia Physical Anesthesia Plan  ASA: II  Anesthesia Plan: Spinal   Post-op Pain Management:    Induction:   PONV Risk Score and Plan:   Airway Management Planned: Oral ETT  Additional Equipment:   Intra-op Plan:   Post-operative Plan:   Informed Consent: I have reviewed the patients History and Physical, chart, labs and discussed the procedure including the risks, benefits and alternatives for the proposed anesthesia with the patient or authorized representative who has indicated his/her understanding and acceptance.     Plan Discussed with: CRNA  Anesthesia Plan Comments:         Anesthesia Quick Evaluation

## 2017-07-20 NOTE — OR Nursing (Signed)
Pt. States Blood  Pressure on left arm is accurate and about 20 points lower than left. Pt. Finished cup of coffee at 0800

## 2017-07-20 NOTE — Op Note (Signed)
OPERATIVE NOTE  DATE OF SURGERY:  07/20/2017  PATIENT NAME:  Cody ZAMAN Sr.   DOB: 03-12-1951  MRN: [id number]  PRE-OPERATIVE DIAGNOSIS: Degenerative arthrosis of the left knee, primary  POST-OPERATIVE DIAGNOSIS:  Same  PROCEDURE:  Left total knee arthroplasty using computer-assisted navigation  SURGEON:  Marciano Sequin. M.D.  ASSISTANT:  Vance Peper, PA (present and scrubbed throughout the case, critical for assistance with exposure, retraction, instrumentation, and closure)  ANESTHESIA: spinal  ESTIMATED BLOOD LOSS: 50 mL  FLUIDS REPLACED: 1700 mL of crystalloid  TOURNIQUET TIME: 97 minutes  DRAINS: 2 medium Hemovac drains  SOFT TISSUE RELEASES: Anterior cruciate ligament, posterior cruciate ligament, deep medial collateral ligament, patellofemoral ligament  IMPLANTS UTILIZED: DePuy Attune size 6 posterior stabilized femoral component (cemented), size 7 rotating platform tibial component (cemented), 41 mm medialized dome patella (cemented), and a 5 mm stabilized rotating platform polyethylene insert.  INDICATIONS FOR SURGERY: Cody DUBOIS Sr. is a 66 y.o. year old male with a long history of progressive knee pain. X-rays demonstrated severe degenerative changes in tricompartmental fashion. The patient had not seen any significant improvement despite conservative nonsurgical intervention. After discussion of the risks and benefits of surgical intervention, the patient expressed understanding of the risks benefits and agree with plans for total knee arthroplasty.   The risks, benefits, and alternatives were discussed at length including but not limited to the risks of infection, bleeding, nerve injury, stiffness, blood clots, the need for revision surgery, cardiopulmonary complications, among others, and they were willing to proceed.  PROCEDURE IN DETAIL: The patient was brought into the operating room and, after adequate spinal anesthesia was achieved, a tourniquet was  placed on the patient's upper thigh. The patient's knee and leg were cleaned and prepped with alcohol and DuraPrep and draped in the usual sterile fashion. A "timeout" was performed as per usual protocol. The lower extremity was exsanguinated using an Esmarch, and the tourniquet was inflated to 300 mmHg. An anterior longitudinal incision was made followed by a standard mid vastus approach. The deep fibers of the medial collateral ligament were elevated in a subperiosteal fashion off of the medial flare of the tibia so as to maintain a continuous soft tissue sleeve. The patella was subluxed laterally and the patellofemoral ligament was incised. Inspection of the knee demonstrated severe degenerative changes with full-thickness loss of articular cartilage. Osteophytes were debrided using a rongeur. Anterior and posterior cruciate ligaments were excised. Two 4.0 mm Schanz pins were inserted in the femur and into the tibia for attachment of the array of trackers used for computer-assisted navigation. Hip center was identified using a circumduction technique. Distal landmarks were mapped using the computer. The distal femur and proximal tibia were mapped using the computer. The distal femoral cutting guide was positioned using computer-assisted navigation so as to achieve a 5 distal valgus cut. The femur was sized and it was felt that a size 6 femoral component was appropriate. A size 6 femoral cutting guide was positioned and the anterior cut was performed and verified using the computer. This was followed by completion of the posterior and chamfer cuts. Femoral cutting guide for the central box was then positioned in the center box cut was performed.  Attention was then directed to the proximal tibia. Medial and lateral menisci were excised. The extramedullary tibial cutting guide was positioned using computer-assisted navigation so as to achieve a 0 varus-valgus alignment and 3 posterior slope. The cut was  performed and verified using the computer. The  proximal tibia was sized and it was felt that a size 7 tibial tray was appropriate. Tibial and femoral trials were inserted followed by insertion of a 5 mm polyethylene insert. This allowed for excellent mediolateral soft tissue balancing both in flexion and in full extension. Finally, the patella was cut and prepared so as to accommodate a 41 mm medialized dome patella. A patella trial was placed and the knee was placed through a range of motion with excellent patellar tracking appreciated. The femoral trial was removed after debridement of posterior osteophytes. The central post-hole for the tibial component was reamed followed by insertion of a keel punch. Tibial trials were then removed. Cut surfaces of bone were irrigated with copious amounts of normal saline with antibiotic solution using pulsatile lavage and then suctioned dry. Polymethylmethacrylate cement was prepared in the usual fashion using a vacuum mixer. Cement was applied to the cut surface of the proximal tibia as well as along the undersurface of a size 7 rotating platform tibial component. Tibial component was positioned and impacted into place. Excess cement was removed using [employer] fast streamer. Cement was then applied to the cut surfaces of the femur as well as along the posterior flanges of the size 6 femoral component. The femoral component was positioned and impacted into place. Excess cement was removed using [employer] fast streamer. A 5 mm polyethylene trial was inserted and the knee was brought into full extension with steady axial compression applied. Finally, cement was applied to the backside of a 41 mm medialized dome patella and the patellar component was positioned and patellar clamp applied. Excess cement was removed using [employer] fast streamer. After adequate curing of the cement, the tourniquet was deflated after a total tourniquet time of 97 minutes. Hemostasis was achieved using electrocautery. The  knee was irrigated with copious amounts of normal saline with antibiotic solution using pulsatile lavage and then suctioned dry. 20 mL of 1.3% Exparel and 60 mL of 0.25% Marcaine in 40 mL of normal saline was injected along the posterior capsule, medial and lateral gutters, and along the arthrotomy site. A 5 mm stabilized rotating platform polyethylene insert was inserted and the knee was placed through a range of motion with excellent mediolateral soft tissue balancing appreciated and excellent patellar tracking noted. 2 medium drains were placed in the wound bed and brought out through separate stab incisions. The medial parapatellar portion of the incision was reapproximated using interrupted sutures of #1 Vicryl. Subcutaneous tissue was approximated in layers using first #0 Vicryl followed #2-0 Vicryl. The skin was approximated with skin staples. A sterile dressing was applied.  The patient tolerated the procedure well and was transported to the recovery room in stable condition.    Cody Ellison., M.D.

## 2017-07-20 NOTE — Transfer of Care (Signed)
Immediate Anesthesia Transfer of Care Note  Patient: Cody Spry Sr.  Procedure(s) Performed: Procedure(s): COMPUTER ASSISTED TOTAL KNEE ARTHROPLASTY (Left)  Patient Location: PACU  Anesthesia Type:Spinal  Level of Consciousness: sedated  Airway & Oxygen Therapy: Patient Spontanous Breathing and Patient connected to face mask oxygen  Post-op Assessment: Report given to RN and Post -op Vital signs reviewed and stable  Post vital signs: Reviewed and stable  Last Vitals:  Vitals:   07/20/17 1020  BP: (!) 151/92  Pulse: (!) 52  Resp: 16  Temp: (!) 36.2 C  SpO2: 98%    Last Pain:  Vitals:   07/20/17 1020  TempSrc: Oral         Complications: No apparent anesthesia complications

## 2017-07-20 NOTE — H&P (Signed)
The patient has been re-examined, and the chart reviewed, and there have been no interval changes to the documented history and physical.    The risks, benefits, and alternatives have been discussed at length. The patient expressed understanding of the risks benefits and agreed with plans for surgical intervention.  Mariusz Jubb P. Lavonta Tillis, Jr. M.D.    

## 2017-07-20 NOTE — Anesthesia Procedure Notes (Signed)
Date/Time: 07/20/2017 12:51 PM Performed by: Nelda Marseille Pre-anesthesia Checklist: Patient identified, Emergency Drugs available, Suction available, Patient being monitored and Timeout performed Oxygen Delivery Method: Simple face mask

## 2017-07-20 NOTE — Anesthesia Post-op Follow-up Note (Signed)
Anesthesia QCDR form completed.        

## 2017-07-21 ENCOUNTER — Encounter: Payer: Self-pay | Admitting: Orthopedic Surgery

## 2017-07-21 LAB — BASIC METABOLIC PANEL
ANION GAP: 6 (ref 5–15)
BUN: 12 mg/dL (ref 6–20)
CALCIUM: 9 mg/dL (ref 8.9–10.3)
CHLORIDE: 106 mmol/L (ref 101–111)
CO2: 28 mmol/L (ref 22–32)
Creatinine, Ser: 1.02 mg/dL (ref 0.61–1.24)
GFR calc non Af Amer: >60 mL/min (ref >60)
GLUCOSE: 112 mg/dL — AB (ref 65–99)
POTASSIUM: 3.8 mmol/L (ref 3.5–5.1)
Sodium: 140 mmol/L (ref 135–145)

## 2017-07-21 LAB — CBC
HEMATOCRIT: 37.7 % — AB (ref 40.0–52.0)
HEMOGLOBIN: 13.4 g/dL (ref 13.0–18.0)
MCH: 33.1 pg (ref 26.0–34.0)
MCHC: 35.5 g/dL (ref 32.0–36.0)
MCV: 93.2 fL (ref 80.0–100.0)
Platelets: 183 10*3/uL (ref 150–440)
RBC: 4.05 MIL/uL — AB (ref 4.40–5.90)
RDW: 13.2 % (ref 11.5–14.5)
WBC: 8.5 10*3/uL (ref 3.8–10.6)

## 2017-07-21 NOTE — Clinical Social Work Note (Signed)
CSW received referral for SNF.  PT is recommending outpatient physical therapy, CSW to sign off please re-consult if social work needs arise.  Jones Broom. [place], MSW, [place]

## 2017-07-21 NOTE — Progress Notes (Signed)
Pt claimed "taking Felodipine every night for 25 years". Wife at bedside confirmed this information. No order for Felodipine on MAR as of this time. On call Dr. Leim Fabry paged and made aware. Order for Felodipine 10mg  daily HS placed. Will administer as ordered and as soon as pharmacy verifies and sends a dose.

## 2017-07-21 NOTE — Evaluation (Signed)
Physical Therapy Evaluation Patient Details Name: Cody PANNONE Sr. MRN: [id number] DOB: 1951/10/23 Today's Date: 07/21/2017   History of Present Illness  admitted for acute hospitalization status post L TKR (07/20/17), WBAT.  Clinical Impression  Upon evaluation, patient alert and oriented; follows all commands and demonstrates good effort with all therapeutic tasks.  Good post-op L LE strength (3-/5) and ROM (2-76 degrees) noted; good quad activation and control, completing L LE SLR without difficulty.  Demonstrates ability to complete bed mobility with mod indep; sit/stand, basic transfers and gait (75') with RW, cga/min assist.  Decreased active use of  L LE (due to pain), but demonstrates progressive weight shift/stance time and progression towards reciprocal stepping pattern throughout gait distance.  No buckling or LOB; excellent efforts to integrate cuing/education from therapist.  Anticipate consistent progression towards all mobility goals throughout hospitalization. Would benefit from skilled PT to address above deficits and promote optimal return to PLOF; recommend transition to home with outpatient follow up as appropriate.    Follow Up Recommendations Outpatient PT    Equipment Recommendations  Rolling walker with 5" wheels    Recommendations for Other Services       Precautions / Restrictions Precautions Precautions: Fall Restrictions Weight Bearing Restrictions: Yes LLE Weight Bearing: Weight bearing as tolerated      Mobility  Bed Mobility Overal bed mobility: Modified Independent                Transfers Overall transfer level: Needs assistance Equipment used: Rolling walker (2 wheeled) Transfers: Sit to/from Stand Sit to Stand: Min guard         General transfer comment: maintains L LE anterior to BOS for pain control; cuing for hand placement  Ambulation/Gait Ambulation/Gait assistance: Min guard Ambulation Distance (Feet): 50 Feet Assistive  device: Rolling walker (2 wheeled)       General Gait Details: 3-point, step to gait pattern progressing towrads reciprocal stepping pattern throguhout distance; limited heel strike/toe off L LE with decreased stance time/weight shift noted.  However, good L quad activation/control-no buckling or LOB noted.  Stairs            Wheelchair Mobility    Modified Rankin (Stroke Patients Only)       Balance Overall balance assessment: Needs assistance Sitting-balance support: No upper extremity supported;Feet supported Sitting balance-Leahy Scale: Normal     Standing balance support: Bilateral upper extremity supported Standing balance-Leahy Scale: Fair                               Pertinent Vitals/Pain Pain Assessment: 0-10 Pain Score: 3  Pain Location: L knee Pain Descriptors / Indicators: Aching;Grimacing;Guarding Pain Intervention(s): Limited activity within patient's tolerance;Monitored during session;Repositioned;Premedicated before session    Home Living Family/patient expects to be discharged to:: Private residence Living Arrangements: Spouse/significant other Available Help at Discharge: Family;Available 24 hours/day (wife retired, able to provide 24/7 assist as needed) Type of Home: House Home Access: Level entry     Home Layout: [place]:  (walking stick)      Prior Function Level of Independence: Independent         Comments: Indep with ADls, household and community mobility without assist device; intermittent use of walking stick for longer-distances.  Denies fall history.     Hand Dominance   Dominant Hand: Right    Extremity/Trunk Assessment   Upper Extremity Assessment Upper Extremity Assessment: Overall WFL for tasks assessed  Lower Extremity Assessment Lower Extremity Assessment:  (L knee grossly 3-/5, limited by pain; otherwise, grossly WFL.  Sensation fully intact)       Communication    Communication: No difficulties  Cognition Arousal/Alertness: Awake/alert Behavior During Therapy: WFL for tasks assessed/performed Overall Cognitive Status: Within Functional Limits for tasks assessed                                        General Comments      Exercises Total Joint Exercises Goniometric ROM: L knee, 2-76 degrees, seated edge of bed Other Exercises Other Exercises: Supine LE therex, 1x10, AROM for muscular strength/endurance: ankle pumps, quad sets, hip abduct/adduct, SLR. Other Exercises: Toilet transfer, ambulatory with RW, cga/min assist; sit/stand from [facility] with RW, cga/min assist, cuing for hand placement. CNA at bedside to complete toileting/hygiene needs as appropriate.   Assessment/Plan    PT Assessment Patient needs continued PT services  PT Problem List Decreased strength;Decreased range of motion;Decreased activity tolerance;Decreased balance;Decreased coordination;Decreased cognition;Decreased knowledge of use of DME;Decreased mobility;Decreased safety awareness;Decreased knowledge of precautions;Pain       PT Treatment Interventions DME instruction;Gait training;Stair training;Functional mobility training;Therapeutic activities;Therapeutic exercise;Balance training;Patient/family education    PT Goals (Current goals can be found in the Care Plan section)  Acute Rehab PT Goals Patient Stated Goal: to return home PT Goal Formulation: With patient Time For Goal Achievement: 08/04/17 Potential to Achieve Goals: Good    Frequency BID   Barriers to discharge        Co-evaluation               AM-PAC PT "6 Clicks" Daily Activity  Outcome Measure Difficulty turning over in bed (including adjusting bedclothes, sheets and blankets)?: A Little Difficulty moving from lying on back to sitting on the side of the bed? : A Little Difficulty sitting down on and standing up from a chair with arms (e.g., wheelchair, bedside commode,  etc,.)?: Unable Help needed moving to and from a bed to chair (including a wheelchair)?: A Little Help needed walking in hospital room?: A Little Help needed climbing 3-5 steps with a railing? : A Little 6 Click Score: 16    End of Session Equipment Utilized During Treatment: Gait belt Activity Tolerance: Patient tolerated treatment well Patient left: in bathroom with CNA; to assist with return to chair and set up as appropriate when complete. Nurse Communication: Mobility status PT Visit Diagnosis: Muscle weakness (generalized) (M62.81);Difficulty in walking, not elsewhere classified (R26.2);Pain Pain - Right/Left: Left Pain - part of body: Knee    Time: 5625-6389 PT Time Calculation (min) (ACUTE ONLY): 43 min   Charges:   PT Evaluation $PT Eval Low Complexity: 1 Low PT Treatments $Therapeutic Exercise: 8-22 mins $Therapeutic Activity: 8-22 mins   PT G Codes:   PT G-Codes **NOT FOR INPATIENT CLASS** Functional Assessment Tool Used: AM-PAC 6 Clicks Basic Mobility Functional Limitation: Mobility: Walking and moving around Mobility: Walking and Moving Around Current Status (H7342): At least 20 percent but less than 40 percent impaired, limited or restricted Mobility: Walking and Moving Around Goal Status [phone]): At least 1 percent but less than 20 percent impaired, limited or restricted    Man Bonneau H. Owens Shark, PT, DPT, NCS 07/21/17, 9:55 AM [phone]

## 2017-07-21 NOTE — Progress Notes (Signed)
Physical Therapy Treatment Patient Details Name: Cody DUZAN Sr. MRN: [id number] DOB: 03-08-1951 Today's Date: 07/21/2017    History of Present Illness admitted for acute hospitalization status post L TKR (07/20/17), WBAT.    PT Comments    Able to complete gait around nursing station and initiate stair training this session.  Noted improvement in L LE mechanics during all functional activities; will continue to reinforce to maximize carry-over.  Excellent participation/effort with all therapeutic tasks. Will continue to educate on progressive active use of L LE as pain allows.    Follow Up Recommendations  Outpatient PT     Equipment Recommendations  Rolling walker with 5" wheels    Recommendations for Other Services       Precautions / Restrictions Precautions Precautions: Fall Restrictions Weight Bearing Restrictions: Yes LLE Weight Bearing: Weight bearing as tolerated    Mobility  Bed Mobility Overal bed mobility: Modified Independent                Transfers Overall transfer level: Needs assistance Equipment used: Rolling walker (2 wheeled) Transfers: Sit to/from Stand Sit to Stand: Min guard;Supervision         General transfer comment: maintains L LE anterior to BOS for pain control; cuing for hand placement  Ambulation/Gait Ambulation/Gait assistance: Min guard Ambulation Distance (Feet): 220 Feet Assistive device: Rolling walker (2 wheeled)       General Gait Details: emphasis on increasing L LE heel strike/toe off and L knee flexion throughout gait cycle; consistent step through gait pattern with increased stance time/weight acceptance L LE   Stairs Stairs: Yes   Stair Management: Two rails Number of Stairs: 4 (x2) General stair comments: min cuing for step to gait pattern; good L knee control/stability in periods of modified SLS  Wheelchair Mobility    Modified Rankin (Stroke Patients Only)       Balance Overall balance  assessment: Needs assistance Sitting-balance support: No upper extremity supported;Feet supported Sitting balance-Leahy Scale: Normal     Standing balance support: Bilateral upper extremity supported Standing balance-Leahy Scale: Good Standing balance comment: excessive weight shift to R LE with all standing activities                            Cognition Arousal/Alertness: Awake/alert Behavior During Therapy: WFL for tasks assessed/performed Overall Cognitive Status: Within Functional Limits for tasks assessed                                        Exercises Other Exercises Other Exercises: Closed-chain left knee stretching at stairs to increased flexibility, mechanics with functional mobility    General Comments        Pertinent Vitals/Pain Pain Assessment: 0-10 Pain Score: 3  Pain Location: L knee Pain Descriptors / Indicators: Aching;Grimacing;Guarding Pain Intervention(s): Monitored during session;Repositioned;Limited activity within patient's tolerance;Patient requesting pain meds-RN notified    Home Living                      Prior Function            PT Goals (current goals can now be found in the care plan section) Acute Rehab PT Goals Patient Stated Goal: to regain independence PT Goal Formulation: With patient Time For Goal Achievement: 08/04/17 Potential to Achieve Goals: Good Progress towards PT goals: Progressing toward goals  Frequency    BID      PT Plan Current plan remains appropriate    Co-evaluation              AM-PAC PT "6 Clicks" Daily Activity  Outcome Measure  Difficulty turning over in bed (including adjusting bedclothes, sheets and blankets)?: A Little Difficulty moving from lying on back to sitting on the side of the bed? : A Little Difficulty sitting down on and standing up from a chair with arms (e.g., wheelchair, bedside commode, etc,.)?: Unable Help needed moving to and from a  bed to chair (including a wheelchair)?: A Little Help needed walking in hospital room?: A Little Help needed climbing 3-5 steps with a railing? : A Little 6 Click Score: 16    End of Session Equipment Utilized During Treatment: Gait belt Activity Tolerance: Patient tolerated treatment well Patient left: in bed;with call bell/phone within reach;with bed alarm set;with family/visitor present Nurse Communication: Mobility status PT Visit Diagnosis: Muscle weakness (generalized) (M62.81);Difficulty in walking, not elsewhere classified (R26.2);Pain Pain - Right/Left: Right Pain - part of body: Knee     Time: 1007-1219 PT Time Calculation (min) (ACUTE ONLY): 26 min  Charges:  $Gait Training: 8-22 mins $Therapeutic Exercise: 8-22 mins                    G Codes:       Jeriel Vivanco H. Owens Shark, PT, DPT, NCS 07/21/17, 9:19 PM [phone]

## 2017-07-21 NOTE — Progress Notes (Signed)
  Subjective: 1 Day Post-Op Procedure(s) (LRB): COMPUTER ASSISTED TOTAL KNEE ARTHROPLASTY (Left) Patient reports pain as mild.   Patient seen in rounds with Dr. Marry Guan. Patient is well, and has had no acute complaints or problems Plan is to go Home versus rehabilitation after hospital stay. Negative for chest pain and shortness of breath Fever: no Gastrointestinal: Negative for nausea and vomiting  Objective: Vital signs in last 24 hours: Temp:  [97 F (36.1 C)-97.7 F (36.5 C)] 97.7 F (36.5 C) (09/19 1725) Pulse Rate:  [52-71] 65 (09/20 0402) Resp:  [13-17] 13 (09/19 1702) BP: (121-178)/(61-92) 121/66 (09/20 0402) SpO2:  [88 %-100 %] 94 % (09/20 0435) Weight:  [91.6 kg (202 lb)] 91.6 kg (202 lb) (09/19 1020)  Intake/Output from previous day:  Intake/Output Summary (Last 24 hours) at 07/21/17 0655 Last data filed at 07/21/17 0620  Gross per 24 hour  Intake          3586.25 ml  Output             4180 ml  Net          -593.75 ml    Intake/Output this shift: Total I/O In: 1733.3 [P.O.:600; I.V.:833.3; IV Piggyback:300] Out: 3030 [Urine:2875; Drains:155]  Labs:  Recent Labs  07/21/17 0415  HGB 13.4    Recent Labs  07/21/17 0415  WBC 8.5  RBC 4.05*  HCT 37.7*  PLT 183    Recent Labs  07/21/17 0415  NA 140  K 3.8  CL 106  CO2 28  BUN 12  CREATININE 1.02  GLUCOSE 112*  CALCIUM 9.0   No results for input(s): LABPT, INR in the last 72 hours.   EXAM General - Patient is Alert and Oriented Extremity - Neurovascular intact Dorsiflexion/Plantar flexion intact No cellulitis present Compartment soft Dressing/Incision - clean, dry, no drainage with the Hemovac intact Motor Function - intact, moving foot and toes well on exam. Able to do an independent straight leg raise  Past Medical History:  Diagnosis Date  . Arthritis    lower back   . Cancer [facility])    prostate cancer   . Coronary artery disease    stent RCA- 2000 and 12/2009   . GERD  (gastroesophageal reflux disease)   . Hyperlipidemia   . Hypertension     Assessment/Plan: 1 Day Post-Op Procedure(s) (LRB): COMPUTER ASSISTED TOTAL KNEE ARTHROPLASTY (Left) Active Problems:   S/P total knee arthroplasty  Estimated body mass index is 29.83 kg/m as calculated from the following:   Height as of this encounter: 5\' 9"  (1.753 m).   Weight as of this encounter: 91.6 kg (202 lb). Advance diet Up with therapy D/C IV fluids Plan for discharge tomorrow  DVT Prophylaxis - Lovenox, Foot Pumps and TED hose Weight-Bearing as tolerated to left leg  Reche Dixon, PA-C Orthopaedic Surgery 07/21/2017, 6:55 AM

## 2017-07-21 NOTE — Anesthesia Postprocedure Evaluation (Signed)
Anesthesia Post Note  Patient: CLEMONS SALVUCCI Sr.  Procedure(s) Performed: Procedure(s) (LRB): COMPUTER ASSISTED TOTAL KNEE ARTHROPLASTY (Left)  Patient location during evaluation: Other Anesthesia Type: Spinal Level of consciousness: oriented and awake and alert Pain management: pain level controlled Vital Signs Assessment: post-procedure vital signs reviewed and stable Respiratory status: spontaneous breathing, respiratory function stable and patient connected to nasal cannula oxygen Cardiovascular status: blood pressure returned to baseline and stable Postop Assessment: no headache, no backache and no apparent nausea or vomiting Anesthetic complications: no     Last Vitals:  Vitals:   07/21/17 0740 07/21/17 0948  BP: (!) 159/85 129/71  Pulse: 76 90  Resp:    Temp: 36.8 C   SpO2: 94%     Last Pain:  Vitals:   07/21/17 1008  TempSrc:   PainSc: 4                  Alison Stalling

## 2017-07-21 NOTE — Care Management Note (Signed)
Case Management Note  Patient Details  Name: Cody ZIMMERLE Sr. MRN: [id number] Date of Birth: 1951/08/27  Subjective/Objective:   POD # 1 left TKA. Spoke with wife and she is agreeable to PT recommendations of OP PT. Appointment scheduled for Monday, Sept 24 at 11:30 am. Wife updated and placed on discharge follow up orders. Patient will need a walker, ordered from [facility]. Refused bcs. Pharmacy: [place] [phone]                  Action/Plan: OP PT scheduled. [facility] for walker.   Expected Discharge Date:  07/22/17               Expected Discharge Plan:  OP Rehab  In-House Referral:     Discharge planning Services  CM Consult  Post Acute Care Choice:  Durable Medical Equipment, Home Health Choice offered to:  Spouse  DME Arranged:    DME Agency:  [place]:    [facility] Agency:     Status of Service:  In process, will continue to follow  If discussed at Long Length of Stay Meetings, dates discussed:    Additional Comments:  Jolly Mango, RN 07/21/2017, 11:08 AM

## 2017-07-21 NOTE — Evaluation (Signed)
Occupational Therapy Evaluation Patient Details Name: Cody SCHEDLER Sr. MRN: [id number] DOB: Mar 03, 1951 Today's Date: 07/21/2017    History of Present Illness admitted for acute hospitalization status post L TKR (07/20/17), WBAT.   Clinical Impression   Pt is 66 year old male s/p L TKR.  Pt was independent in all ADLs prior to surgery living at home with his wife.  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  He is eager to return to PLOF.  Pt currently requires min assist for LB dressing while in seated position due to pain and limited AROM of L knee.  Pt would benefit from instruction in dressing techniques with or without assistive devices for dressing and bathing skills.  Pt would also benefit from recommendations for home modifications to increase safety in the bathroom and prevent falls. No further OT recommended after discharge.    Follow Up Recommendations  No OT follow up    Equipment Recommendations       Recommendations for Other Services       Precautions / Restrictions Precautions Precautions: Fall Restrictions Weight Bearing Restrictions: Yes LLE Weight Bearing: Weight bearing as tolerated      Mobility Bed Mobility                  Transfers                      Balance                                           ADL either performed or assessed with clinical judgement   ADL Overall ADL's : Needs assistance/impaired Eating/Feeding: Independent;Set up   Grooming: Wash/dry hands;Wash/dry face;Oral care;Applying deodorant;Independent;Set up   Upper Body Bathing: Independent;Set up   Lower Body Bathing: Minimal assistance;Set up   Upper Body Dressing : Independent;Set up   Lower Body Dressing: Minimal assistance;Set up Lower Body Dressing Details (indicate cue type and reason): Demonstration with verbal teach back by patient and his wife due to pt in a lot of pain and requesting pain meds  from RN--Jessica, RN notified               General ADL Comments: Pt is a Development worker, 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  He has declined getting a BSC.     Vision Patient Visual Report: No change from baseline       Perception     Praxis      Pertinent Vitals/Pain Pain Assessment: 0-10 Pain Score: 5  Pain Location: L knee Pain Descriptors / Indicators: Aching;Grimacing;Guarding Pain Intervention(s): Limited activity within patient's tolerance;Monitored during session;Patient requesting pain meds-RN notified;Repositioned     Hand Dominance Right   Extremity/Trunk Assessment Upper Extremity Assessment Upper Extremity Assessment: Overall WFL for tasks assessed   Lower Extremity Assessment Lower Extremity Assessment: Defer to PT evaluation       Communication Communication Communication: No difficulties   Cognition Arousal/Alertness: Awake/alert Behavior During Therapy: WFL for tasks assessed/performed Overall Cognitive Status: Within Functional Limits for tasks assessed                                     General Comments       Exercises  Shoulder Instructions      Home Living Family/patient expects to be discharged to:: Private residence Living Arrangements: Spouse/significant other Available Help at Discharge: Family;Available 24 hours/day Type of Home: House Home Access: Level entry     Home Layout: Two level Alternate Level Stairs-Number of Steps: 9 Alternate Level Stairs-Rails: Right;Left;Can reach both Bathroom Shower/Tub: Tub/shower unit;Door   [employer] Toilet: Standard Bathroom Accessibility: Yes How Accessible: Accessible via walker Home Equipment: Grab bars - tub/shower;Adaptive equipment Adaptive Equipment: Reacher        Prior Functioning/Environment Level of Independence: Independent        Comments: Indep with ADls, household and community mobility without assist device; intermittent use of  walking stick for longer-distances.  Denies fall history.        OT Problem List: Pain;Decreased strength;Decreased range of motion;Decreased activity tolerance;Decreased knowledge of use of DME or AE      OT Treatment/Interventions: Self-care/ADL training;DME and/or AE instruction;Patient/family education    OT Goals(Current goals can be found in the care plan section) Acute Rehab OT Goals Patient Stated Goal: to regain independence OT Goal Formulation: With patient/family Time For Goal Achievement: 08/04/17 Potential to Achieve Goals: Good ADL Goals Pt Will Perform Lower Body Dressing: with adaptive equipment;Independently;with set-up;sit to/from stand (using FWW and no LOB) Pt Will Transfer to Toilet: with set-up;regular height toilet;with supervision  OT Frequency: Min 1X/week   Barriers to D/C:            Co-evaluation              AM-PAC PT "6 Clicks" Daily Activity     Outcome Measure Help from another person eating meals?: None Help from another person taking care of personal grooming?: None Help from another person toileting, which includes using toliet, bedpan, or urinal?: A Little Help from another person bathing (including washing, rinsing, drying)?: A Little Help from another person to put on and taking off regular upper body clothing?: None Help from another person to put on and taking off regular lower body clothing?: A Little 6 Click Score: 21   End of Session Nurse Communication: Patient requests pain meds  Activity Tolerance: Patient tolerated treatment well Patient left: in bed;with call bell/phone within reach;with family/visitor present  OT Visit Diagnosis: Unsteadiness on feet (R26.81);Muscle weakness (generalized) (M62.81)                Time: 4401-0272 OT Time Calculation (min): 28 min Charges:  OT General Charges $OT Visit: 1 Visit OT Evaluation $OT Eval Low Complexity: 1 Low OT Treatments $Self Care/Home Management : 8-22  mins G-Codes: OT G-codes **NOT FOR INPATIENT CLASS** Functional Assessment Tool Used: AM-PAC 6 Clicks Daily Activity;Clinical judgement Functional Limitation: Self care Self Care Current Status (Z3664): At least 20 percent but less than 40 percent impaired, limited or restricted Self Care Goal Status (Q0347): At least 1 percent but less than 20 percent impaired, limited or restricted   Chrys Racer, OTR/L ascom [phone] 07/21/17, 4:27 PM

## 2017-07-21 NOTE — Progress Notes (Signed)
Pt alert and oriented X4. Medication given 5/10 left knee pain. Pt due to void. IV saline locked. Pt working with PT at this time. No complaints at this time. Will continue to monitor.

## 2017-07-22 LAB — BASIC METABOLIC PANEL
ANION GAP: 7 (ref 5–15)
BUN: 15 mg/dL (ref 6–20)
CALCIUM: 9 mg/dL (ref 8.9–10.3)
CO2: 28 mmol/L (ref 22–32)
CREATININE: 1.02 mg/dL (ref 0.61–1.24)
Chloride: 101 mmol/L (ref 101–111)
GFR calc Af Amer: >60 mL/min (ref >60)
GLUCOSE: 131 mg/dL — AB (ref 65–99)
Potassium: 3.8 mmol/L (ref 3.5–5.1)
Sodium: 136 mmol/L (ref 135–145)

## 2017-07-22 LAB — CBC
HCT: 37.6 % — ABNORMAL LOW (ref 40.0–52.0)
HEMOGLOBIN: 13.2 g/dL (ref 13.0–18.0)
MCH: 33 pg (ref 26.0–34.0)
MCHC: 35.1 g/dL (ref 32.0–36.0)
MCV: 94.1 fL (ref 80.0–100.0)
PLATELETS: 167 10*3/uL (ref 150–440)
RBC: 4 MIL/uL — ABNORMAL LOW (ref 4.40–5.90)
RDW: 13.5 % (ref 11.5–14.5)
WBC: 9.4 10*3/uL (ref 3.8–10.6)

## 2017-07-22 MED ORDER — ENOXAPARIN SODIUM 40 MG/0.4ML ~~LOC~~ SOLN: 40.0000 mg | SUBCUTANEOUS | 0 refills | Status: DC

## 2017-07-22 MED ORDER — OXYCODONE HCL 5 MG PO TABS: 5.0000 mg | ORAL_TABLET | ORAL | 0 refills | Status: DC | PRN

## 2017-07-22 MED ORDER — TRAMADOL HCL 50 MG PO TABS: 50.0000 mg | ORAL_TABLET | ORAL | 1 refills | Status: DC | PRN

## 2017-07-22 NOTE — Care Management Note (Addendum)
Case Management Note  Patient Details  Name: Cody Ellison. MRN: [id number] Date of Birth: [birth year]  Subjective/Objective: Discharging today                   Action/Plan: Per discharge summary.Called Lovenox 40 mg # 14 no refills.   Prescription was escribed to [facility]. Cost of Lovenox is $ 111.87. Updated wife. She would like precription called to [facility] [address] because that's the preferred pharmacy for patients insurance. New cost of Lovenox is $ 111.77  She denies issues paying for medication.   Expected Discharge Date:  07/22/17               Expected Discharge Plan:  OP Rehab  In-House Referral:     Discharge planning Services  CM Consult  Post Acute Care Choice:  Durable Medical Equipment, Home Health Choice offered to:  Spouse  DME Arranged:  [place] rolling DME Agency:  [place]:    [facility] Agency:     Status of Service:  Completed, signed off  If discussed at [place] of Stay Meetings, dates discussed:    Additional Comments:  Jolly Mango, RN 07/22/2017, 8:26 AM

## 2017-07-22 NOTE — Discharge Summary (Signed)
Physician Discharge Summary  Subjective: 2 Days Post-Op Procedure(s) (LRB): COMPUTER ASSISTED TOTAL KNEE ARTHROPLASTY (Left) Patient reports pain as mild.   Patient seen in rounds with Dr. Marry Guan. Patient is well, and has had no acute complaints or problems Patient is ready to go home with home health physical therapy  Physician Discharge Summary  Patient ID: Cody CABA Sr. MRN: [id number] DOB/AGE: Apr 05, 1951 66 y.o.  Admit date: 07/20/2017 Discharge date: 07/22/2017  Admission Diagnoses:  Discharge Diagnoses:  Active Problems:   S/P total knee arthroplasty   Discharged Condition: good  Hospital Course: The patient is postop day 2 from a left total knee replacement. The patient has done well since surgery. He ambulated 220 feet yesterday and has been independent with straight leg raise. The patient had stable labs with her vitals showing slightly elevated blood pressure 157/73. The patient has been eating normally.  Treatments: surgery:  Left total knee arthroplasty using computer-assisted navigation  SURGEON:  Marciano Sequin. M.D.  ASSISTANT:  Vance Peper, PA (present and scrubbed throughout the case, critical for assistance with exposure, retraction, instrumentation, and closure)  ANESTHESIA: spinal  ESTIMATED BLOOD LOSS: 50 mL  FLUIDS REPLACED: 1700 mL of crystalloid  TOURNIQUET TIME: 97 minutes  DRAINS: 2 medium Hemovac drains  SOFT TISSUE RELEASES: Anterior cruciate ligament, posterior cruciate ligament, deep medial collateral ligament, patellofemoral ligament  IMPLANTS UTILIZED: DePuy Attune size 6 posterior stabilized femoral component (cemented), size 7 rotating platform tibial component (cemented), 41 mm medialized dome patella (cemented), and a 5 mm stabilized rotating platform polyethylene insert.  Discharge Exam: Blood pressure (!) 157/73, pulse 77, temperature 98.5 F (36.9 C), temperature source Oral, resp. rate 16, height 5\' 9"  (1.753 m),  weight 91.6 kg (202 lb), SpO2 90 %.   Disposition: 01-Home or Self Care   Allergies as of 07/22/2017      Reactions   Ampicillin Other (See Comments)   Raw mouth and throat    Atorvastatin    Other reaction(s): Unknown   Carvedilol    Other reaction(s): Dizziness   Contrast Media [iodinated Diagnostic Agents] Hives   Diltiazem Hcl Other (See Comments)   Fluvastatin Other (See Comments)   Lovastatin Other (See Comments)   Sulfa Antibiotics Hives   Betadine [povidone Iodine] Rash      Medication List    TAKE these medications   acetaminophen 500 MG tablet Commonly known as:  TYLENOL Take 1,000 mg by mouth 2 (two) times daily.   aspirin EC 325 MG tablet Take by mouth.   aspirin EC 81 MG tablet Take 81 mg by mouth daily.   atenolol 100 MG tablet Commonly known as:  TENORMIN Take 100 mg by mouth every morning.   CALTRATE 600+D PLUS PO Take 1 tablet by mouth daily.   CoQ10 100 MG Caps Take 100 mg by mouth daily.   enoxaparin 40 MG/0.4ML injection Commonly known as:  LOVENOX Inject 0.4 mLs (40 mg total) into the skin daily.   felodipine 10 MG 24 hr tablet Commonly known as:  PLENDIL Take 10 mg by mouth at bedtime. Patient takes at nite   folic acid 258 MCG tablet Commonly known as:  FOLVITE Take 800 mcg by mouth daily.   hydrochlorothiazide 25 MG tablet Commonly known as:  HYDRODIURIL Take 25 mg by mouth as needed.   lisinopril-hydrochlorothiazide 20-12.5 MG tablet Commonly known as:  PRINZIDE,ZESTORETIC Take 1 tablet by mouth every morning.   MUCINEX SINUS-MAX CONGESTION 5-325-200 MG Tabs Generic drug:  Phenylephrine-APAP-Guaifenesin Take  2 tablets by mouth as needed.   niacin 500 MG tablet Take 1,000 mg by mouth 2 (two) times daily with a meal.   omeprazole 20 MG capsule Commonly known as:  PRILOSEC Take 20 mg by mouth daily.   oxyCODONE 5 MG immediate release tablet Commonly known as:  Oxy IR/ROXICODONE Take 1-2 tablets (5-10 mg total) by mouth  every 4 (four) hours as needed for severe pain or breakthrough pain.   simvastatin 40 MG tablet Commonly known as:  ZOCOR Take 40 mg by mouth at bedtime.   traMADol 50 MG tablet Commonly known as:  ULTRAM Take 1-2 tablets (50-100 mg total) by mouth every 4 (four) hours as needed for moderate pain.   triamcinolone 55 MCG/ACT Aero nasal inhaler Commonly known as:  NASACORT Place 2 sprays into the nose daily.   vitamin B-12 1000 MCG tablet Commonly known as:  CYANOCOBALAMIN Take 1,000 mcg by mouth daily.   vitamin E 400 UNIT capsule Take 400 Units by mouth daily.            Durable Medical Equipment        Start     Ordered   07/20/17 1713  DME Walker rolling  Once    Question:  Patient needs a walker to treat with the following condition  Answer:  Total knee replacement status   07/20/17 1712   07/20/17 1713  DME Bedside commode  Once    Question:  Patient needs a bedside commode to treat with the following condition  Answer:  Total knee replacement status   07/20/17 1712       Discharge Care Instructions        Start     Ordered   07/22/17 0000  oxyCODONE (OXY IR/ROXICODONE) 5 MG immediate release tablet  Every 4 hours PRN    Comments:  Diagnosis code: Status post left total knee replacement   07/22/17 0706   07/22/17 0000  traMADol (ULTRAM) 50 MG tablet  Every 4 hours PRN    Comments:  Diagnosis code: Status post left total knee replacement   07/22/17 0706   07/22/17 0000  enoxaparin (LOVENOX) 40 MG/0.4ML injection  Every 24 hours     07/22/17 0706     Follow-up Information    Watt Climes, PA Follow up on 08/04/2017.   Specialty:  Physician Assistant Why:  at 1:15pm Contact information: [place] [phone]        [name], MD Follow up on 09/01/2017.   Specialty:  Orthopedic Surgery Why:  at 10:00am Contact information: [place]  [address] [phone]        [facility] Follow up on 07/25/2017.   Why:  Fisrt Out Patient Physical Therapy Appointment Monday @ 11:30 am.  Contact information: [place] [address] [phone]           Signed: Prescott Parma, Aryaan Persichetti 07/22/2017, 7:06 AM   Objective: Vital signs in last 24 hours: Temp:  [98.2 F (36.8 C)-98.7 F (37.1 C)] 98.5 F (36.9 C) (09/20 2056) Pulse Rate:  [72-90] 77 (09/20 2056) Resp:  [16] 16 (09/20 1635) BP: (129-159)/(71-85) 157/73 (09/20 2056) SpO2:  [90 %-96 %] 90 % (09/20 2056)  Intake/Output from previous day:  Intake/Output Summary (Last 24 hours) at 07/22/17 0706 Last data filed at 07/22/17 0636  Gross per 24 hour  Intake  720 ml  Output              800 ml  Net              -80 ml    Intake/Output this shift: No intake/output data recorded.  Labs:  Recent Labs  07/21/17 0415 07/22/17 0341  HGB 13.4 13.2    Recent Labs  07/21/17 0415 07/22/17 0341  WBC 8.5 9.4  RBC 4.05* 4.00*  HCT 37.7* 37.6*  PLT 183 167    Recent Labs  07/21/17 0415 07/22/17 0341  NA 140 136  K 3.8 3.8  CL 106 101  CO2 28 28  BUN 12 15  CREATININE 1.02 1.02  GLUCOSE 112* 131*  CALCIUM 9.0 9.0   No results for input(s): LABPT, INR in the last 72 hours.  EXAM: General - Patient is Alert and Oriented Extremity - Sensation intact distally Dorsiflexion/Plantar flexion intact No cellulitis present Compartment soft Incision - clean, dry, no drainage. The surgical bandage was removed. Motor Function -  flexion and dorsiflexion intact. Able to straight leg raise.  Assessment/Plan: 2 Days Post-Op Procedure(s) (LRB): COMPUTER ASSISTED TOTAL KNEE ARTHROPLASTY (Left) Procedure(s) (LRB): COMPUTER ASSISTED TOTAL KNEE ARTHROPLASTY (Left) Past Medical History:  Diagnosis Date  . Arthritis    lower back   . Cancer [facility])    prostate cancer   . Coronary artery disease    stent RCA-  2000 and 12/2009   . GERD (gastroesophageal reflux disease)   . Hyperlipidemia   . Hypertension    Active Problems:   S/P total knee arthroplasty  Estimated body mass index is 29.83 kg/m as calculated from the following:   Height as of this encounter: 5\' 9"  (1.753 m).   Weight as of this encounter: 91.6 kg (202 lb). Discharge home with home health Diet - Regular diet Follow up - in 2 weeks Activity - WBAT Disposition - Home Condition Upon Discharge - Good DVT Prophylaxis - Lovenox and TED hose  Cody Dixon, PA-C Orthopaedic Surgery 07/22/2017, 7:06 AM

## 2017-07-22 NOTE — Progress Notes (Signed)
Physical Therapy Treatment Patient Details Name: Cody KLEY Sr. MRN: [id number] DOB: [birth year] Today's Date: 07/22/2017    History of Present Illness admitted for acute hospitalization status post L TKR (07/20/17), WBAT.    PT Comments    Good progression towards all mobility goals, completing all transfers, gait and mobility with sup/mod indep.  Demonstrates ability to safely negotiate home environment; patient/wife comfortable with current status and level of assist required at home.    Follow Up Recommendations  Outpatient PT     Equipment Recommendations  Rolling walker with 5" wheels    Recommendations for Other Services       Precautions / Restrictions Precautions Precautions: Fall Restrictions Weight Bearing Restrictions: Yes LLE Weight Bearing: Weight bearing as tolerated    Mobility  Bed Mobility Overal bed mobility: Modified Independent                Transfers Overall transfer level: Modified independent Equipment used: Rolling walker (2 wheeled) Transfers: Sit to/from Stand           General transfer comment: encouraged for symmetrical LE foot placement to maximize WBing/active use of L LE with movement transitions  Ambulation/Gait Ambulation/Gait assistance: Supervision Ambulation Distance (Feet): 220 Feet Assistive device: Rolling walker (2 wheeled)   Gait velocity: 10' walk time, 10 seconds   General Gait Details: reciprocal stepping pattern with improved heel strike/toe off; consistent cadence with good stance time/weight acceptance L LE   Stairs Stairs: Yes   Stair Management: Two rails Number of Stairs: 16 General stair comments: step to gait pattern, good carry-over of sequence/technique.  Good L knee control/stability  Wheelchair Mobility    Modified Rankin (Stroke Patients Only)       Balance Overall balance assessment: Needs assistance Sitting-balance support: No upper extremity supported;Feet supported Sitting  balance-Leahy Scale: Normal     Standing balance support: Bilateral upper extremity supported Standing balance-Leahy Scale: Good                              Cognition Arousal/Alertness: Awake/alert Behavior During Therapy: WFL for tasks assessed/performed Overall Cognitive Status: Within Functional Limits for tasks assessed                                        Exercises Total Joint Exercises Goniometric ROM: L knee, 0-85 degrees Other Exercises Other Exercises: Standing LE therex, 1x10, with RW, cga/close sup: hip 4-way, emphasis on closed-chain activation (TKE) L LE and isolated L hip/knee strength.  Min cuing/assist for postural control and activation (to minimize compensatory techniques) Other Exercises: Standing LE squats, 2x10, close sup-emphasis on symmetrical WBing bilat LEs and functional L knee flex/ext. Other Exercises: Verbally reviewed (therapist demonstrated) technique for car transfers (has running board to truck); patient/wife voiced understanding.    General Comments        Pertinent Vitals/Pain Pain Assessment: 0-10 Pain Score: 2  Pain Location: L knee Pain Descriptors / Indicators: Aching;Grimacing;Guarding Pain Intervention(s): Limited activity within patient's tolerance;Monitored during session;Repositioned    Home Living                      Prior Function            PT Goals (current goals can now be found in the care plan section) Acute Rehab PT Goals Patient Stated Goal: to regain  independence PT Goal Formulation: With patient Time For Goal Achievement: 08/04/17 Potential to Achieve Goals: Good Progress towards PT goals: Progressing toward goals    Frequency    BID      PT Plan Current plan remains appropriate    Co-evaluation              AM-PAC PT "6 Clicks" Daily Activity  Outcome Measure  Difficulty turning over in bed (including adjusting bedclothes, sheets and blankets)?: A  Little Difficulty moving from lying on back to sitting on the side of the bed? : A Little Difficulty sitting down on and standing up from a chair with arms (e.g., wheelchair, bedside commode, etc,.)?: Unable Help needed moving to and from a bed to chair (including a wheelchair)?: A Little Help needed walking in hospital room?: A Little Help needed climbing 3-5 steps with a railing? : A Little 6 Click Score: 16    End of Session Equipment Utilized During Treatment: Gait belt Activity Tolerance: Patient tolerated treatment well Patient left: in bed;with call bell/phone within reach;with bed alarm set;with family/visitor present Nurse Communication: Mobility status PT Visit Diagnosis: Muscle weakness (generalized) (M62.81);Difficulty in walking, not elsewhere classified (R26.2);Pain Pain - Right/Left: Right Pain - part of body: Knee     Time: 5188-4166 PT Time Calculation (min) (ACUTE ONLY): 40 min  Charges:  $Gait Training: 8-22 mins $Therapeutic Exercise: 8-22 mins $Therapeutic Activity: 8-22 mins                    G Codes:       Dexter Signor H. Owens Shark, PT, DPT, NCS 07/22/17, 10:35 AM [phone]

## 2017-07-22 NOTE — Progress Notes (Signed)
  Subjective: 2 Days Post-Op Procedure(s) (LRB): COMPUTER ASSISTED TOTAL KNEE ARTHROPLASTY (Left) Patient reports pain as mild.   Patient seen in rounds with Dr. Marry Guan. Patient is well, and has had no acute complaints or problems Plan is to go Home after hospital stay. Negative for chest pain and shortness of breath Fever: no Gastrointestinal: Negative for nausea and vomiting  Objective: Vital signs in last 24 hours: Temp:  [98.2 F (36.8 C)-98.7 F (37.1 C)] 98.5 F (36.9 C) (09/20 2056) Pulse Rate:  [72-90] 77 (09/20 2056) Resp:  [16] 16 (09/20 1635) BP: (129-159)/(71-85) 157/73 (09/20 2056) SpO2:  [90 %-96 %] 90 % (09/20 2056)  Intake/Output from previous day:  Intake/Output Summary (Last 24 hours) at 07/22/17 0702 Last data filed at 07/22/17 0636  Gross per 24 hour  Intake              720 ml  Output              800 ml  Net              -80 ml    Intake/Output this shift: No intake/output data recorded.  Labs:  Recent Labs  07/21/17 0415 07/22/17 0341  HGB 13.4 13.2    Recent Labs  07/21/17 0415 07/22/17 0341  WBC 8.5 9.4  RBC 4.05* 4.00*  HCT 37.7* 37.6*  PLT 183 167    Recent Labs  07/21/17 0415 07/22/17 0341  NA 140 136  K 3.8 3.8  CL 106 101  CO2 28 28  BUN 12 15  CREATININE 1.02 1.02  GLUCOSE 112* 131*  CALCIUM 9.0 9.0   No results for input(s): LABPT, INR in the last 72 hours.   EXAM General - Patient is Alert and Oriented Extremity - Neurovascular intact Dorsiflexion/Plantar flexion intact No cellulitis present Compartment soft Dressing/Incision - clean, dry, no drainage with the Hemovac Removed Motor Function - intact, moving foot and toes well on exam. Able to do an independent straight leg raise. Able to ambulate 220 feet including stairs.  Past Medical History:  Diagnosis Date  . Arthritis    lower back   . Cancer [facility])    prostate cancer   . Coronary artery disease    stent RCA- 2000 and 12/2009   . GERD  (gastroesophageal reflux disease)   . Hyperlipidemia   . Hypertension     Assessment/Plan: 2 Days Post-Op Procedure(s) (LRB): COMPUTER ASSISTED TOTAL KNEE ARTHROPLASTY (Left) Active Problems:   S/P total knee arthroplasty  Estimated body mass index is 29.83 kg/m as calculated from the following:   Height as of this encounter: 5\' 9"  (1.753 m).   Weight as of this encounter: 91.6 kg (202 lb).  Discharge plan today after physical therapy. DVT prophylaxis with Lovenox Bowel management.  DVT Prophylaxis - Lovenox, Foot Pumps and TED hose Weight-Bearing as tolerated to left leg  Reche Dixon, PA-C Orthopaedic Surgery 07/22/2017, 7:02 AM

## 2017-07-22 NOTE — Care Management Important Message (Signed)
Important Message  Patient Details  Name: Cody DITOMMASO Sr. MRN: [id number] Date of Birth: 08-Dec-1950   Medicare Important Message Given:  N/A - LOS <3 / Initial given by admissions    Jolly Mango, RN 07/22/2017, 8:27 AM

## 2017-07-22 NOTE — Progress Notes (Signed)
Discharge summary reviewed with verbal understanding. Dressing changed to left leg with honeycomb. Sent 1 extra drsg. Belongings packed, 3Rxs given upon discharge. Escorted to personal vehicle via wc by ortho staff.

## 2017-10-19 ENCOUNTER — Other Ambulatory Visit: Payer: Self-pay | Admitting: *Deleted

## 2017-11-30 ENCOUNTER — Inpatient Hospital Stay: Payer: Medicare Other | Attending: Radiation Oncology

## 2017-11-30 DIAGNOSIS — C61 Malignant neoplasm of prostate: Secondary | ICD-10-CM | POA: Diagnosis present

## 2017-11-30 LAB — PSA: PROSTATIC SPECIFIC ANTIGEN: 0.02 ng/mL (ref 0.00–4.00)

## 2017-12-01 ENCOUNTER — Other Ambulatory Visit: Payer: Medicare Other

## 2017-12-07 ENCOUNTER — Other Ambulatory Visit: Payer: Self-pay

## 2017-12-07 ENCOUNTER — Ambulatory Visit
Admission: RE | Admit: 2017-12-07 | Discharge: 2017-12-07 | Disposition: A | Discharge status: Home / Self Care | Payer: Medicare Other | Source: Ambulatory Visit | Attending: Radiation Oncology | Admitting: Radiation Oncology

## 2017-12-07 ENCOUNTER — Encounter: Payer: Self-pay | Admitting: Radiation Oncology

## 2017-12-07 VITALS — BP 109/72 | HR 68 | Temp 97.7°F | Resp 20 | Wt 211.6 lb

## 2017-12-07 DIAGNOSIS — C61 Malignant neoplasm of prostate: Secondary | ICD-10-CM | POA: Insufficient documentation

## 2017-12-07 DIAGNOSIS — Z923 Personal history of irradiation: Secondary | ICD-10-CM | POA: Insufficient documentation

## 2017-12-07 NOTE — Progress Notes (Signed)
Radiation Oncology Follow up Note  Name: Cody GRIFFING Sr.   Date:   12/07/2017 MRN:  [id number] DOB: [birth year]    This 67 y.o. male presents to the clinic today for 2 year follow-up status post salvage radiation therapy for Gleason 7 adenocarcinoma the prostate.  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 6 months ago he had a spike in his PSA to 2.1 from previously measuring 1.1. We started him on Lupron 4 month Depo he is seen today in follow-up 6 months later. His PSA has normalized 0.02. He specifically denies any GI GU complaints.  COMPLICATIONS OF TREATMENT: none  FOLLOW UP COMPLIANCE: keeps appointments   PHYSICAL EXAM:  BP 109/72   Pulse 68   Temp 97.7 F (36.5 C)   Resp 20   Wt 211 lb 10.3 oz (96 kg)   BMI 31.25 kg/m  Well-developed well-nourished patient in NAD. HEENT reveals PERLA, EOMI, discs not visualized.  Oral cavity is clear. No oral mucosal lesions are identified. Neck is clear without evidence of cervical or supraclavicular adenopathy. Lungs are clear to A&P. Cardiac examination is essentially unremarkable with regular rate and rhythm without murmur rub or thrill. Abdomen is benign with no organomegaly or masses noted. Motor sensory and DTR levels are equal and symmetric in the upper and lower extremities. Cranial nerves II through XII are grossly intact. Proprioception is intact. No peripheral adenopathy or edema is identified. No motor or sensory levels are noted. Crude visual fields are within normal range.  RADIOLOGY RESULTS: No current films for review  PLAN: Present time I'm please was overall PSA. I will leave him alone at this point and see him back in 6 months and repeat his PSA to determine whether he needs another pulse of Lupron. Patient and wife are both comfortable with my  treatment plan.  I would like to take this opportunity to thank you for allowing me to participate in the care of your patient.Noreene Filbert, MD

## 2017-12-08 ENCOUNTER — Ambulatory Visit: Payer: Medicare Other | Admitting: Radiation Oncology

## 2018-04-12 DIAGNOSIS — Z0000 Encounter for general adult medical examination without abnormal findings: Secondary | ICD-10-CM | POA: Insufficient documentation

## 2018-04-12 DIAGNOSIS — E1151 Type 2 diabetes mellitus with diabetic peripheral angiopathy without gangrene: Secondary | ICD-10-CM | POA: Insufficient documentation

## 2018-06-15 ENCOUNTER — Other Ambulatory Visit: Payer: Self-pay

## 2018-06-15 ENCOUNTER — Inpatient Hospital Stay: Payer: Medicare Other | Attending: Radiation Oncology

## 2018-06-15 DIAGNOSIS — C61 Malignant neoplasm of prostate: Secondary | ICD-10-CM | POA: Diagnosis not present

## 2018-06-15 LAB — PSA: PROSTATIC SPECIFIC ANTIGEN: 1.22 ng/mL (ref 0.00–4.00)

## 2018-06-22 ENCOUNTER — Encounter: Payer: Self-pay | Admitting: Radiation Oncology

## 2018-06-22 ENCOUNTER — Other Ambulatory Visit: Payer: Self-pay | Admitting: *Deleted

## 2018-06-22 ENCOUNTER — Other Ambulatory Visit: Payer: Self-pay

## 2018-06-22 ENCOUNTER — Ambulatory Visit
Admission: RE | Admit: 2018-06-22 | Discharge: 2018-06-22 | Disposition: A | Discharge status: Home / Self Care | Payer: Medicare Other | Source: Ambulatory Visit | Attending: Radiation Oncology | Admitting: Radiation Oncology

## 2018-06-22 VITALS — BP 113/78 | HR 70 | Temp 96.6°F | Resp 18 | Wt 212.2 lb

## 2018-06-22 DIAGNOSIS — C61 Malignant neoplasm of prostate: Secondary | ICD-10-CM

## 2018-06-22 NOTE — Progress Notes (Signed)
Radiation Oncology Follow up Note  Name: Cody SENFT Sr.   Date:   06/22/2018 MRN:  [id number] DOB: [birth year]    This 67 y.o. male presents to the clinic today for 2 and half year follow-up status post salvage radiation therapy for Gleason 7 adenocarcinoma the prostate.  REFERRING PROVIDER: [name], MD  HPI: Cody Ellison is a 67 year old male now out 2-1/2 years having completed salvage radiation therapy for a Gleason 7 adenocarcinoma the prostate. About a year ago he did spike his PSA up to 2.1 from previous measurement 1.1 and he was started on pulse Lupron therapy. His PSA normalized 0.02. His most recent PSA performed was 1.2. He specifically denies bone pain any change in lower urinary tract symptoms or diarrhea..  COMPLICATIONS OF TREATMENT: none  FOLLOW UP COMPLIANCE: keeps appointments   PHYSICAL EXAM:  BP 113/78 (BP Location: Left Arm)   Pulse 70   Temp (!) 96.6 F (35.9 C) (Tympanic)   Resp 18   Wt 212 lb 3.1 oz (96.2 kg)   BMI 31.34 kg/m  On rectal exam rectal sphincter tone is good prostatic fossa is clear without evidence of nodularity or mass.Well-developed well-nourished patient in NAD. HEENT reveals PERLA, EOMI, discs not visualized.  Oral cavity is clear. No oral mucosal lesions are identified. Neck is clear without evidence of cervical or supraclavicular adenopathy. Lungs are clear to A&P. Cardiac examination is essentially unremarkable with regular rate and rhythm without murmur rub or thrill. Abdomen is benign with no organomegaly or masses noted. Motor sensory and DTR levels are equal and symmetric in the upper and lower extremities. Cranial nerves II through XII are grossly intact. Proprioception is intact. No peripheral adenopathy or edema is identified. No motor or sensory levels are noted. Crude visual fields are within normal range.  RADIOLOGY RESULTS: no current films for review  PLAN: at the present time I'm to see him back in 6 months and repeat a PSA  at that time. Do not feel like pulling the trigger another Lupron injection based on the slow rising nature of his PSA. I'm also referring him to medical oncology for opinion. Patient comprehend my treatment plan well.  I would like to take this opportunity to thank you for allowing me to participate in the care of your patient.Noreene Filbert, MD

## 2018-07-03 DIAGNOSIS — C61 Malignant neoplasm of prostate: Secondary | ICD-10-CM | POA: Insufficient documentation

## 2018-07-03 NOTE — Progress Notes (Signed)
[place]  [phone] [phone]) [phone]  ID: Carlynn Spry Sr. OB: [birth year]  MR#: [id number]  KGM#:[id number]  Patient Care Team: [name], MD as PCP - General (Internal Medicine)  CHIEF COMPLAINT: Stage IIIb prostate cancer  INTERVAL HISTORY: Patient is a 67 year old male with a history of prostate cancer, Gleason 7, who underwent prostatectomy several years ago.  Approximately 2 and half years ago he was noted to have an increasing PSA and underwent salvage XRT and Lupron.  Patient only received 1 injection of Lupron on June 01, 2017.  Patient's PSA noted to be increasing again to 1.2 and is referred to medical oncology for further evaluation.  He currently feels well and is asymptomatic.  He denies any pain.  He has no neurologic complaints.  He denies any recent fevers or illnesses.  He has good appetite and denies weight loss.  He has no chest pain or shortness of breath.  He denies any nausea, vomiting, constipation, or diarrhea.  He has no urinary complaints.  Patient feels at his baseline offers no specific complaints today.  REVIEW OF SYSTEMS:   Review of Systems  Constitutional: Negative.  Negative for fever, malaise/fatigue and weight loss.  Respiratory: Negative.  Negative for cough, hemoptysis and shortness of breath.   Cardiovascular: Negative.  Negative for chest pain and leg swelling.  Gastrointestinal: Negative.  Negative for abdominal pain.  Genitourinary: Negative.  Negative for dysuria and hematuria.  Musculoskeletal: Negative.  Negative for back pain.  Skin: Negative.  Negative for rash.  Neurological: Negative.  Negative for dizziness, focal weakness, weakness and headaches.  Psychiatric/Behavioral: Negative.  The patient is not nervous/anxious.     As per HPI. Otherwise, a complete review of systems is negative.  PAST MEDICAL HISTORY: Past Medical History:  Diagnosis Date  . Arthritis    lower back   . Cancer [facility])     prostate cancer   . Coronary artery disease    stent RCA- 2000 and 12/2009   . GERD (gastroesophageal reflux disease)   . Hyperlipidemia   . Hypertension     PAST SURGICAL HISTORY: Past Surgical History:  Procedure Laterality Date  . APPENDECTOMY    . COLONOSCOPY WITH PROPOFOL N/A 07/18/2017   Procedure: COLONOSCOPY WITH PROPOFOL;  Surgeon: [name], MD;  Location: [facility] ENDOSCOPY;  Service: Endoscopy;  Laterality: N/A;  . CORONARY ANGIOPLASTY    . coronary stents      x 2  . KNEE ARTHROPLASTY Left 07/20/2017   Procedure: COMPUTER ASSISTED TOTAL KNEE ARTHROPLASTY;  Surgeon: [name], MD;  Location: [facility] ORS;  Service: Orthopedics;  Laterality: Left;  . ROBOT ASSISTED LAPAROSCOPIC RADICAL PROSTATECTOMY N/A 07/16/2013   Procedure: ROBOTIC ASSISTED LAPAROSCOPIC RADICAL PROSTATECTOMY LEVEL 2, bilateral pelvic lymphadenectomy;  Surgeon: [name], MD;  Location: [facility] ORS;  Service: Urology;  Laterality: N/A;  . TONSILLECTOMY     and adenoidectomy     FAMILY HISTORY: Family History  Problem Relation Age of Onset  . Cancer Mother 80       cervical cancer  . Heart attack Father   . Thyroid cancer Daughter 13       doing well    ADVANCED DIRECTIVES (Y/N):  N  HEALTH MAINTENANCE: Social History   Tobacco Use  . Smoking status: Former Smoker    Packs/day: 1.00    Years: 30.00    Pack years: 30.00    Types: Cigarettes    Last attempt to quit: 11/01/1992  Years since quitting: 25.6  . Smokeless tobacco: Never Used  Substance Use Topics  . Alcohol use: No  . Drug use: No     Colonoscopy:  PAP:  Bone density:  Lipid panel:  Allergies  Allergen Reactions  . Ampicillin Other (See Comments)    Raw mouth and throat   . Atorvastatin     Other reaction(s): Unknown  . Carvedilol     Other reaction(s): Dizziness  . Contrast Media [Iodinated Diagnostic Agents] Hives  . Diltiazem Hcl Other (See Comments)  . Fluvastatin Other (See Comments)  . Lovastatin  Other (See Comments)  . Sulfa Antibiotics Hives  . Betadine [Povidone Iodine] Rash    Current Outpatient Medications  Medication Sig Dispense Refill  . aspirin EC 81 MG tablet Take 81 mg by mouth daily.    [name] atenolol (TENORMIN) 100 MG tablet Take 100 mg by mouth every morning.    . Calcium Carbonate-Vit D-Min (CALTRATE 600+D PLUS PO) Take 1 tablet by mouth daily.     . Coenzyme Q10 (COQ10) 100 MG CAPS Take 100 mg by mouth daily.    . felodipine (PLENDIL) 10 MG 24 hr tablet Take 10 mg by mouth at bedtime. Patient takes at nite    . folic acid (FOLVITE) 161 MCG tablet Take 800 mcg by mouth daily.    [name] lisinopril-hydrochlorothiazide (PRINZIDE,ZESTORETIC) 20-12.5 MG per tablet Take 1 tablet by mouth every morning.    . niacin 500 MG tablet Take 1,000 mg by mouth 2 (two) times daily with a meal.    . omeprazole (PRILOSEC) 20 MG capsule Take 20 mg by mouth daily.    . simvastatin (ZOCOR) 40 MG tablet Take 40 mg by mouth at bedtime.  3  . triamcinolone (NASACORT) 55 MCG/ACT AERO nasal inhaler Place 2 sprays into the nose daily.     . vitamin B-12 (CYANOCOBALAMIN) 1000 MCG tablet Take 1,000 mcg by mouth daily.    . vitamin E 400 UNIT capsule Take 400 Units by mouth daily.    [name] acetaminophen (TYLENOL) 500 MG tablet Take 1,000 mg by mouth 2 (two) times daily.     . hydrochlorothiazide (HYDRODIURIL) 25 MG tablet Take 25 mg by mouth as needed.    [name] oxyCODONE (OXY IR/ROXICODONE) 5 MG immediate release tablet Take 1-2 tablets (5-10 mg total) by mouth every 4 (four) hours as needed for severe pain or breakthrough pain. (Patient not taking: Reported on 12/07/2017) 40 tablet 0  . Phenylephrine-APAP-Guaifenesin (MUCINEX SINUS-MAX CONGESTION) 5-325-200 MG TABS Take 2 tablets by mouth as needed.     No current facility-administered medications for this visit.     OBJECTIVE: Vitals:   07/06/18 1336  BP: (!) 139/91  Pulse: 61  Resp: 18  Temp: (!) 95.9 F (35.5 C)     Body mass index is 31.4 kg/m.     ECOG FS:0 - Asymptomatic  General: Well-developed, well-nourished, no acute distress. Eyes: Pink conjunctiva, anicteric sclera. HEENT: Normocephalic, moist mucous membranes, clear oropharnyx. Lungs: Clear to auscultation bilaterally. Heart: Regular rate and rhythm. No rubs, murmurs, or gallops. Abdomen: Soft, nontender, nondistended. No organomegaly noted, normoactive bowel sounds. Musculoskeletal: No edema, cyanosis, or clubbing. Neuro: Alert, answering all questions appropriately. Cranial nerves grossly intact. Skin: No rashes or petechiae noted. Psych: Normal affect. Lymphatics: No cervical, calvicular, axillary or inguinal LAD.   LAB RESULTS:  Lab Results  Component Value Date   NA 136 07/22/2017   K 3.8 07/22/2017   CL 101 07/22/2017   CO2 28  07/22/2017   GLUCOSE 131 (H) 07/22/2017   BUN 15 07/22/2017   CREATININE 1.02 07/22/2017   CALCIUM 9.0 07/22/2017   PROT 6.8 07/06/2017   ALBUMIN 4.1 07/06/2017   AST 20 07/06/2017   ALT 20 07/06/2017   ALKPHOS 46 07/06/2017   BILITOT 1.2 07/06/2017   GFRNONAA >60 07/22/2017   GFRAA >60 07/22/2017    Lab Results  Component Value Date   WBC 9.4 07/22/2017   HGB 13.2 07/22/2017   HCT 37.6 (L) 07/22/2017   MCV 94.1 07/22/2017   PLT 167 07/22/2017     STUDIES: No results found.  ASSESSMENT: Stage IIIb prostate cancer  PLAN:    1.  Stage IIIb prostate cancer: Patient underwent radical prostatectomy on July 06, 2013.  Final pathology reported Gleason 7 (4+3), extracapsular extension, but margins were clear.  Seminal vesicles were not involved and 0 of 8 lymph nodes were negative for disease.  Patient noted to have a rising PSA and underwent salvage XRT in approximately spring 2017.  Nuclear med bone scan on May 30, 2017 revealed improvement of uptake in the right posterior inferior pubic ramus and distal left femoral metadiaphysis.  No metastatic disease was reported.  His PSA is now trending up and is 1.22.  Patient  only received 1 injection of Lupron, therefore would not consider him castrate resistant.  He does not wish to pursue treatment at this time, but expressed understanding that he likely will require reinitiation of Lupron in the near future.  Gillermina Phy and Orient were also discussed, but would not introduce these medications unless patient had castrate resistant disease or widespread metastatic disease.  Return to clinic in 3 months for laboratory work only and then in 6 months for further evaluation.  I spent a total of 60 minutes face-to-face with the patient of which greater than 50% of the visit was spent in counseling and coordination of care as detailed above.   Patient expressed understanding and was in agreement with this plan. He also understands that He can call clinic at any time with any questions, concerns, or complaints.   Cancer Staging Prostate cancer [facility]) Staging form: Prostate, AJCC 8th Edition - Clinical stage from 07/08/2018: Stage IIIB (cT3a, cN0, cM0, Grade Group: 3) - Signed by Lloyd Huger, MD on 07/08/2018   Lloyd Huger, MD   07/08/2018 12:29 PM

## 2018-07-06 ENCOUNTER — Other Ambulatory Visit: Payer: Self-pay

## 2018-07-06 ENCOUNTER — Encounter: Payer: Self-pay | Admitting: Oncology

## 2018-07-06 ENCOUNTER — Inpatient Hospital Stay: Payer: Medicare Other | Attending: Oncology | Admitting: Oncology

## 2018-07-06 ENCOUNTER — Telehealth: Payer: Self-pay | Admitting: *Deleted

## 2018-07-06 DIAGNOSIS — Z87891 Personal history of nicotine dependence: Secondary | ICD-10-CM | POA: Insufficient documentation

## 2018-07-06 DIAGNOSIS — C61 Malignant neoplasm of prostate: Secondary | ICD-10-CM | POA: Diagnosis present

## 2018-07-06 DIAGNOSIS — I10 Essential (primary) hypertension: Secondary | ICD-10-CM | POA: Insufficient documentation

## 2018-07-06 DIAGNOSIS — Z9079 Acquired absence of other genital organ(s): Secondary | ICD-10-CM | POA: Insufficient documentation

## 2018-07-06 DIAGNOSIS — Z923 Personal history of irradiation: Secondary | ICD-10-CM

## 2018-07-06 NOTE — Telephone Encounter (Signed)
Entered in error

## 2018-07-06 NOTE — Progress Notes (Signed)
Here for new pt evaluation.  

## 2018-10-02 ENCOUNTER — Other Ambulatory Visit: Payer: Self-pay

## 2018-10-02 ENCOUNTER — Inpatient Hospital Stay: Payer: Medicare Other | Attending: Oncology

## 2018-10-02 DIAGNOSIS — Z87891 Personal history of nicotine dependence: Secondary | ICD-10-CM | POA: Insufficient documentation

## 2018-10-02 DIAGNOSIS — Z923 Personal history of irradiation: Secondary | ICD-10-CM | POA: Diagnosis not present

## 2018-10-02 DIAGNOSIS — I10 Essential (primary) hypertension: Secondary | ICD-10-CM | POA: Diagnosis not present

## 2018-10-02 DIAGNOSIS — C61 Malignant neoplasm of prostate: Secondary | ICD-10-CM | POA: Diagnosis present

## 2018-10-02 DIAGNOSIS — Z9079 Acquired absence of other genital organ(s): Secondary | ICD-10-CM | POA: Insufficient documentation

## 2018-10-02 LAB — PSA: PROSTATIC SPECIFIC ANTIGEN: 2.32 ng/mL (ref 0.00–4.00)

## 2018-10-15 NOTE — Progress Notes (Signed)
[place]  Telephone:(336) [phone] Fax:(336) [phone]  ID: Cody Ellison. OB: [birth year]  MR#: [id number]  QHU#:[id number]  Patient Care Team: [name], MD as PCP - General (Internal Medicine)  CHIEF COMPLAINT: Stage IIIb prostate cancer  INTERVAL HISTORY: Patient returns to clinic today for further evaluation and discussion of his slowly increasing PSA.  He currently feels well and is asymptomatic. He denies any pain.  He has no neurologic complaints.  He denies any recent fevers or illnesses.  He has good appetite and denies weight loss.  He has no chest pain or shortness of breath.  He denies any nausea, vomiting, constipation, or diarrhea.  He has no urinary complaints.  Patient feels at his baseline offers no specific complaints today.  REVIEW OF SYSTEMS:   Review of Systems  Constitutional: Negative.  Negative for fever, malaise/fatigue and weight loss.  Respiratory: Negative.  Negative for cough, hemoptysis and shortness of breath.   Cardiovascular: Negative.  Negative for chest pain and leg swelling.  Gastrointestinal: Negative.  Negative for abdominal pain.  Genitourinary: Negative.  Negative for dysuria and hematuria.  Musculoskeletal: Negative.  Negative for back pain.  Skin: Negative.  Negative for rash.  Neurological: Negative.  Negative for dizziness, focal weakness, weakness and headaches.  Psychiatric/Behavioral: Negative.  The patient is not nervous/anxious.     As per HPI. Otherwise, a complete review of systems is negative.  PAST MEDICAL HISTORY: Past Medical History:  Diagnosis Date  . Arthritis    lower back   . Cancer [facility])    prostate cancer   . Coronary artery disease    stent RCA- 2000 and 12/2009   . GERD (gastroesophageal reflux disease)   . Hyperlipidemia   . Hypertension     PAST SURGICAL HISTORY: Past Surgical History:  Procedure Laterality Date  . APPENDECTOMY    . COLONOSCOPY WITH PROPOFOL N/A 07/18/2017   Procedure: COLONOSCOPY WITH PROPOFOL;  Surgeon: [name], MD;  Location: [facility] ENDOSCOPY;  Service: Endoscopy;  Laterality: N/A;  . CORONARY ANGIOPLASTY    . coronary stents      x 2  . KNEE ARTHROPLASTY Left 07/20/2017   Procedure: COMPUTER ASSISTED TOTAL KNEE ARTHROPLASTY;  Surgeon: [name], MD;  Location: [facility] ORS;  Service: Orthopedics;  Laterality: Left;  . ROBOT ASSISTED LAPAROSCOPIC RADICAL PROSTATECTOMY N/A 07/16/2013   Procedure: ROBOTIC ASSISTED LAPAROSCOPIC RADICAL PROSTATECTOMY LEVEL 2, bilateral pelvic lymphadenectomy;  Surgeon: [name], MD;  Location: [facility] ORS;  Service: Urology;  Laterality: N/A;  . TONSILLECTOMY     and adenoidectomy     FAMILY HISTORY: Family History  Problem Relation Age of Onset  . Cancer Mother 12       cervical cancer  . Heart attack Father   . Thyroid cancer Daughter 54       doing well    ADVANCED DIRECTIVES (Y/N):  N  HEALTH MAINTENANCE: Social History   Tobacco Use  . Smoking status: Former Smoker    Packs/day: 1.00    Years: 30.00    Pack years: 30.00    Types: Cigarettes    Last attempt to quit: 11/01/1992    Years since quitting: 25.9  . Smokeless tobacco: Never Used  Substance Use Topics  . Alcohol use: No  . Drug use: No     Colonoscopy:  PAP:  Bone density:  Lipid panel:  Allergies  Allergen Reactions  . Ampicillin Other (See Comments)    Raw mouth and throat   .  Atorvastatin     Other reaction(s): Unknown  . Carvedilol     Other reaction(s): Dizziness  . Contrast Media [Iodinated Diagnostic Agents] Hives  . Diltiazem Hcl Other (See Comments)  . Fluvastatin Other (See Comments)  . Lovastatin Other (See Comments)  . Sulfa Antibiotics Hives  . Betadine [Povidone Iodine] Rash    Current Outpatient Medications  Medication Sig Dispense Refill  . acetaminophen (TYLENOL) 500 MG tablet Take 1,000 mg by mouth 2 (two) times daily.     [name] aspirin EC 81 MG tablet Take 81 mg by mouth daily.    [name] atenolol  (TENORMIN) 100 MG tablet Take 100 mg by mouth every morning.    . Calcium Carbonate-Vit D-Min (CALTRATE 600+D PLUS PO) Take 1 tablet by mouth daily.     . Coenzyme Q10 (COQ10) 100 MG CAPS Take 100 mg by mouth daily.    . felodipine (PLENDIL) 10 MG 24 hr tablet Take 10 mg by mouth at bedtime. Patient takes at nite    . folic acid (FOLVITE) 979 MCG tablet Take 800 mcg by mouth daily.    . hydrochlorothiazide (HYDRODIURIL) 25 MG tablet Take 25 mg by mouth as needed.    [name] lisinopril-hydrochlorothiazide (PRINZIDE,ZESTORETIC) 20-12.5 MG per tablet Take 1 tablet by mouth every morning.    . niacin 500 MG tablet Take 1,000 mg by mouth 2 (two) times daily with a meal.    . omeprazole (PRILOSEC) 20 MG capsule Take 20 mg by mouth daily.    [name] oxyCODONE (OXY IR/ROXICODONE) 5 MG immediate release tablet Take 1-2 tablets (5-10 mg total) by mouth every 4 (four) hours as needed for severe pain or breakthrough pain. 40 tablet 0  . Phenylephrine-APAP-Guaifenesin (MUCINEX SINUS-MAX CONGESTION) 5-325-200 MG TABS Take 2 tablets by mouth as needed.    . simvastatin (ZOCOR) 40 MG tablet Take 40 mg by mouth at bedtime.  3  . triamcinolone (NASACORT) 55 MCG/ACT AERO nasal inhaler Place 2 sprays into the nose daily.     . vitamin B-12 (CYANOCOBALAMIN) 1000 MCG tablet Take 1,000 mcg by mouth daily.    . vitamin E 400 UNIT capsule Take 400 Units by mouth daily.     No current facility-administered medications for this visit.     OBJECTIVE: Vitals:   10/16/18 1425  BP: 102/71  Pulse: (!) 102  Temp: (!) 97.3 F (36.3 C)     Body mass index is 31.99 kg/m.    ECOG FS:0 - Asymptomatic  General: Well-developed, well-nourished, no acute distress. Eyes: Pink conjunctiva, anicteric sclera. HEENT: Normocephalic, moist mucous membranes. Lungs: Clear to auscultation bilaterally. Heart: Regular rate and rhythm. No rubs, murmurs, or gallops. Abdomen: Soft, nontender, nondistended. No organomegaly noted, normoactive bowel  sounds. Musculoskeletal: No edema, cyanosis, or clubbing. Neuro: Alert, answering all questions appropriately. Cranial nerves grossly intact. Skin: No rashes or petechiae noted. Psych: Normal affect.  LAB RESULTS:  Lab Results  Component Value Date   NA 136 07/22/2017   K 3.8 07/22/2017   CL 101 07/22/2017   CO2 28 07/22/2017   GLUCOSE 131 (H) 07/22/2017   BUN 15 07/22/2017   CREATININE 1.02 07/22/2017   CALCIUM 9.0 07/22/2017   PROT 6.8 07/06/2017   ALBUMIN 4.1 07/06/2017   AST 20 07/06/2017   ALT 20 07/06/2017   ALKPHOS 46 07/06/2017   BILITOT 1.2 07/06/2017   GFRNONAA >60 07/22/2017   GFRAA >60 07/22/2017    Lab Results  Component Value Date   WBC 9.4 07/22/2017  HGB 13.2 07/22/2017   HCT 37.6 (L) 07/22/2017   MCV 94.1 07/22/2017   PLT 167 07/22/2017     STUDIES: No results found.  ASSESSMENT: Stage IIIb prostate cancer  PLAN:    1.  Stage IIIb prostate cancer: Patient underwent radical prostatectomy on July 06, 2013.  Final pathology reported Gleason 7 (4+3), extracapsular extension, but margins were clear.  Seminal vesicles were not involved and 0 of 8 lymph nodes were negative for disease.  Patient noted to have a rising PSA and underwent salvage XRT in Spring of 2017.  Nuclear med bone scan on May 30, 2017 revealed improvement of uptake in the right posterior inferior pubic ramus and distal left femoral metadiaphysis.  No metastatic disease was reported.  His PSA continues to trend up and is now 2.32.  Previously, patient only received 1 injection of Lupron, therefore would still consider him castrate sensitive disease.  After lengthy discussion, patient does not wish to pursue active surveillance and would like to reinitiate Lupron.  Prior to initiating treatment will reimage to assess for metastatic disease.  Return to clinic 3 months after his Lupron injection for further evaluation and consideration of transitioning him to 31-month dosing of Lupron.  I  spent a total of 30 minutes face-to-face with the patient of which greater than 50% of the visit was spent in counseling and coordination of care as detailed above.  Patient expressed understanding and was in agreement with this plan. He also understands that He can call clinic at any time with any questions, concerns, or complaints.   Cancer Staging Prostate cancer [facility]) Staging form: Prostate, AJCC 8th Edition - Clinical stage from 07/08/2018: Stage IIIB (cT3a, cN0, cM0, Grade Group: 3) - Signed by Lloyd Huger, MD on 07/08/2018   Lloyd Huger, MD   10/18/2018 6:54 AM

## 2018-10-16 ENCOUNTER — Other Ambulatory Visit: Payer: Self-pay

## 2018-10-16 ENCOUNTER — Inpatient Hospital Stay (HOSPITAL_BASED_OUTPATIENT_CLINIC_OR_DEPARTMENT_OTHER): Payer: Medicare Other | Admitting: Oncology

## 2018-10-16 VITALS — BP 102/71 | HR 102 | Temp 97.3°F | Ht 69.5 in | Wt 219.8 lb

## 2018-10-16 DIAGNOSIS — Z923 Personal history of irradiation: Secondary | ICD-10-CM

## 2018-10-16 DIAGNOSIS — Z9079 Acquired absence of other genital organ(s): Secondary | ICD-10-CM | POA: Diagnosis not present

## 2018-10-16 DIAGNOSIS — C61 Malignant neoplasm of prostate: Secondary | ICD-10-CM

## 2018-10-16 DIAGNOSIS — I10 Essential (primary) hypertension: Secondary | ICD-10-CM

## 2018-10-16 DIAGNOSIS — Z87891 Personal history of nicotine dependence: Secondary | ICD-10-CM

## 2018-10-16 NOTE — Progress Notes (Signed)
Patient is here today to follow up on his prostate cancer. Patient stated that he had been doing well.

## 2018-10-27 ENCOUNTER — Telehealth: Payer: Self-pay | Admitting: *Deleted

## 2018-10-27 NOTE — Telephone Encounter (Signed)
Yes per Dr. Inda Castle for lab/lupron should be moved out to after the PET scan. It looks like PET is currently scheduled for 12/31 so anytime after that will be fine for lab/lupron. Thanks

## 2018-10-27 NOTE — Telephone Encounter (Signed)
Patient's PET got rescheduled due to the Contrast media not being available and it is for after his scheduled appointment for Lupron, they state that you wanted the PET before his Lupron is given and so asking what to do, reschedule Lupron appointment? Please advise

## 2018-10-30 ENCOUNTER — Inpatient Hospital Stay: Payer: Medicare Other

## 2018-10-31 ENCOUNTER — Encounter
Admission: RE | Admit: 2018-10-31 | Discharge: 2018-10-31 | Disposition: A | Discharge status: Home / Self Care | Payer: Medicare Other | Source: Ambulatory Visit | Attending: Oncology | Admitting: Oncology

## 2018-10-31 DIAGNOSIS — C61 Malignant neoplasm of prostate: Secondary | ICD-10-CM | POA: Diagnosis not present

## 2018-10-31 IMAGING — PT NM PET NOPR SKULL BASE TO THIGH
1 of 9 series · 2 of 25 positions shown · non-contrast
Comparison: Bone scintigraphy 05/30/2017

CLINICAL DATA: Prostate carcinoma with biochemical recurrence.

EXAM:
NUCLEAR MEDICINE PET SKULL BASE TO THIGH
TECHNIQUE: 10.1 mCi F-18 Fluciclovine was injected intravenously. Full-ring PET
imaging was performed from the skull base to thigh after the
radiotracer. CT data was obtained and used for attenuation
correction and anatomic localization.

[Series 3: ct wb 5.0 b30f · axial · 5.0mm · 0.98mm/px · z∈[-1162,-178]mm · 2 of 329 slices shown]
[im 1/329  brain]
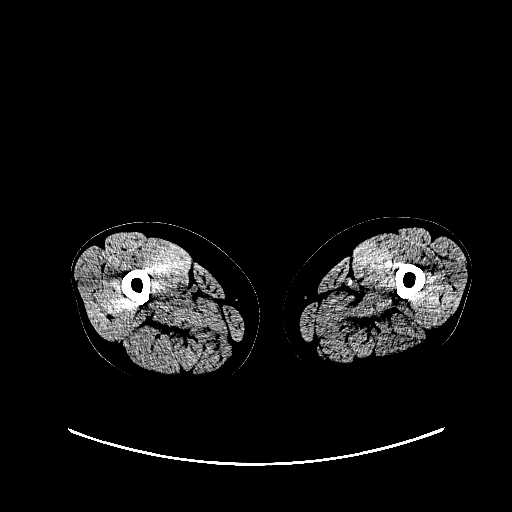
[im 329/329  brain]
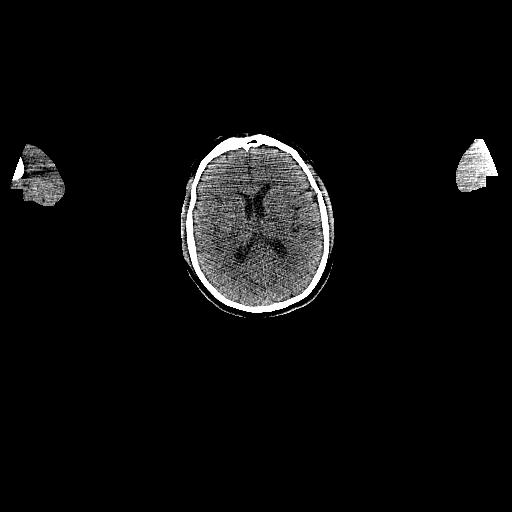

[2 of 25 positions shown; findings below may reference images not displayed]

FINDINGS: NECK

No radiotracer activity in neck lymph nodes.

Incidental CT finding: None

CHEST

No radiotracer accumulation within mediastinal or hilar lymph nodes.
No suspicious pulmonary nodules on the CT scan.

Incidental CT finding: Calcified granuloma identified within the
lower lobes. Small noncalcified nodule in the left upper lobe is far
too small to characterize by PET measuring 3 mm, image 91/3.

ABDOMEN/PELVIS

Prostate: No focal activity in the prostate bed.

Lymph nodes: No abnormal radiotracer accumulation within pelvic or
abdominal nodes.

Liver: No evidence of liver metastasis

Incidental CT finding: Small stones noted within the gallbladder.
Aortic atherosclerosis. No aneurysm. Periumbilical hernia contains
fat only.

SKELETON

No focal  activity to suggest skeletal metastasis.
IMPRESSION: 1. No abnormal focus of increased radiotracer uptake to suggest
nodal or solid organ prostate cancer metastasis. No suspicious bone
lesions identified.
2.  Aortic Atherosclerosis ([id number].Z).
3. Gallstones

## 2018-10-31 MED ORDER — AXUMIN (FLUCICLOVINE F 18) INJECTION
10.0000 | Freq: Once | INTRAVENOUS | Status: AC
  Administered 2018-10-31: 10.1 via INTRAVENOUS

## 2018-11-02 ENCOUNTER — Inpatient Hospital Stay: Payer: Medicare Other

## 2018-11-02 ENCOUNTER — Inpatient Hospital Stay: Payer: Medicare Other | Attending: Oncology

## 2018-11-02 ENCOUNTER — Other Ambulatory Visit: Payer: Self-pay

## 2018-11-02 DIAGNOSIS — C61 Malignant neoplasm of prostate: Secondary | ICD-10-CM

## 2018-11-02 DIAGNOSIS — Z5111 Encounter for antineoplastic chemotherapy: Secondary | ICD-10-CM | POA: Diagnosis not present

## 2018-11-02 LAB — BASIC METABOLIC PANEL
ANION GAP: 9 (ref 5–15)
BUN: 21 mg/dL (ref 8–23)
CALCIUM: 9.2 mg/dL (ref 8.9–10.3)
CO2: 25 mmol/L (ref 22–32)
Chloride: 106 mmol/L (ref 98–111)
Creatinine, Ser: 1.29 mg/dL — ABNORMAL HIGH (ref 0.61–1.24)
GFR calc Af Amer: >60 mL/min (ref >60)
GFR calc non Af Amer: 57 mL/min — ABNORMAL LOW (ref >60)
GLUCOSE: 128 mg/dL — AB (ref 70–99)
Potassium: 4.1 mmol/L (ref 3.5–5.1)
Sodium: 140 mmol/L (ref 135–145)

## 2018-11-02 LAB — PSA: Prostatic Specific Antigen: 2.94 ng/mL (ref 0.00–4.00)

## 2018-11-02 MED ORDER — LEUPROLIDE ACETATE (3 MONTH) 22.5 MG IM KIT
22.5000 mg | PACK | Freq: Once | INTRAMUSCULAR | Status: AC
  Administered 2018-11-02: 22.5 mg via INTRAMUSCULAR

## 2018-12-20 ENCOUNTER — Other Ambulatory Visit: Payer: Medicare Other

## 2018-12-27 ENCOUNTER — Ambulatory Visit: Payer: Medicare Other | Admitting: Oncology

## 2018-12-27 ENCOUNTER — Ambulatory Visit: Payer: Medicare Other | Admitting: Radiation Oncology

## 2019-01-26 ENCOUNTER — Other Ambulatory Visit: Payer: Self-pay

## 2019-01-26 ENCOUNTER — Telehealth: Payer: Self-pay

## 2019-01-26 NOTE — Telephone Encounter (Signed)
Patient called me back to let me know that he really do not want to come to his appointments on Monday because he really does not want to expose himself. Therefore, he is asking to reschedule. Can you please contact patient and reschedule if allowed. Thank you.

## 2019-01-26 NOTE — Telephone Encounter (Signed)
Will notify Rad Onc and call pt.

## 2019-01-29 ENCOUNTER — Inpatient Hospital Stay: Payer: Medicare Other

## 2019-01-29 ENCOUNTER — Inpatient Hospital Stay: Payer: Medicare Other | Admitting: Oncology

## 2019-01-29 ENCOUNTER — Ambulatory Visit: Payer: Medicare Other | Admitting: Radiation Oncology

## 2019-02-22 ENCOUNTER — Other Ambulatory Visit: Payer: Self-pay

## 2019-02-23 ENCOUNTER — Other Ambulatory Visit: Payer: Self-pay

## 2019-02-23 ENCOUNTER — Encounter: Payer: Self-pay | Admitting: Oncology

## 2019-02-23 ENCOUNTER — Inpatient Hospital Stay: Payer: Medicare Other

## 2019-02-23 ENCOUNTER — Encounter: Payer: Self-pay | Admitting: Radiation Oncology

## 2019-02-23 ENCOUNTER — Inpatient Hospital Stay (HOSPITAL_BASED_OUTPATIENT_CLINIC_OR_DEPARTMENT_OTHER): Payer: Medicare Other | Admitting: Oncology

## 2019-02-23 ENCOUNTER — Ambulatory Visit
Admission: RE | Admit: 2019-02-23 | Discharge: 2019-02-23 | Disposition: A | Discharge status: Home / Self Care | Payer: Medicare Other | Source: Ambulatory Visit | Attending: Radiation Oncology | Admitting: Radiation Oncology

## 2019-02-23 ENCOUNTER — Inpatient Hospital Stay: Payer: Medicare Other | Attending: Oncology

## 2019-02-23 VITALS — BP 104/73 | HR 63 | Temp 97.9°F | Ht 69.0 in | Wt 214.9 lb

## 2019-02-23 DIAGNOSIS — Z923 Personal history of irradiation: Secondary | ICD-10-CM

## 2019-02-23 DIAGNOSIS — C61 Malignant neoplasm of prostate: Secondary | ICD-10-CM

## 2019-02-23 DIAGNOSIS — Z85850 Personal history of malignant neoplasm of thyroid: Secondary | ICD-10-CM

## 2019-02-23 DIAGNOSIS — Z87891 Personal history of nicotine dependence: Secondary | ICD-10-CM | POA: Diagnosis not present

## 2019-02-23 DIAGNOSIS — Z5111 Encounter for antineoplastic chemotherapy: Secondary | ICD-10-CM | POA: Diagnosis not present

## 2019-02-23 DIAGNOSIS — Z9079 Acquired absence of other genital organ(s): Secondary | ICD-10-CM | POA: Diagnosis not present

## 2019-02-23 LAB — BASIC METABOLIC PANEL
Anion gap: 7 (ref 5–15)
BUN: 22 mg/dL (ref 8–23)
CO2: 25 mmol/L (ref 22–32)
Calcium: 9.4 mg/dL (ref 8.9–10.3)
Chloride: 105 mmol/L (ref 98–111)
Creatinine, Ser: 1.5 mg/dL — ABNORMAL HIGH (ref 0.61–1.24)
GFR calc Af Amer: 55 mL/min — ABNORMAL LOW (ref >60)
GFR calc non Af Amer: 47 mL/min — ABNORMAL LOW (ref >60)
Glucose, Bld: 122 mg/dL — ABNORMAL HIGH (ref 70–99)
Potassium: 4.1 mmol/L (ref 3.5–5.1)
Sodium: 137 mmol/L (ref 135–145)

## 2019-02-23 LAB — PSA: Prostatic Specific Antigen: 0.18 ng/mL (ref 0.00–4.00)

## 2019-02-23 MED ORDER — LEUPROLIDE ACETATE (6 MONTH) 45 MG IM KIT
45.0000 mg | PACK | Freq: Once | INTRAMUSCULAR | Status: AC
  Administered 2019-02-23: 45 mg via INTRAMUSCULAR
  Filled 2019-02-23: qty 45

## 2019-02-23 NOTE — Progress Notes (Signed)
Patient stated that he had been doing well with no complaints. 

## 2019-02-23 NOTE — Progress Notes (Signed)
[place]  [phone] Fax:(336) [phone]  ID: Cody Spry Sr. OB: [birth year]  MR#: [id number]  MPN#:[id number]  Patient Care Team: [name], MD as PCP - General (Internal Medicine)  CHIEF COMPLAINT: Stage IIIb prostate cancer  INTERVAL HISTORY: Patient returns to clinic today for further evaluation and continuation of Lupron.  He noticed increased fatigue as well as occasional hot flashes but otherwise tolerated his treatment well.  He currently feels well and is asymptomatic. He denies any pain.  He has no neurologic complaints.  He denies any recent fevers or illnesses.  He has a good appetite and denies weight loss.  Denies any chest pain, shortness of breath, cough, or hemoptysis.  He denies any nausea, vomiting, constipation, or diarrhea.  He has no urinary complaints.  Patient offers no further specific complaints today.  REVIEW OF SYSTEMS:   Review of Systems  Constitutional: Positive for malaise/fatigue. Negative for fever and weight loss.  Respiratory: Negative.  Negative for cough, hemoptysis and shortness of breath.   Cardiovascular: Negative.  Negative for chest pain and leg swelling.  Gastrointestinal: Negative.  Negative for abdominal pain.  Genitourinary: Negative.  Negative for dysuria and hematuria.  Musculoskeletal: Negative.  Negative for back pain.  Skin: Negative.  Negative for rash.  Neurological: Positive for sensory change. Negative for dizziness, focal weakness, weakness and headaches.  Psychiatric/Behavioral: Negative.  The patient is not nervous/anxious.     As per HPI. Otherwise, a complete review of systems is negative.  PAST MEDICAL HISTORY: Past Medical History:  Diagnosis Date  . Arthritis    lower back   . Cancer [facility])    prostate cancer   . Coronary artery disease    stent RCA- 2000 and 12/2009   . GERD (gastroesophageal reflux disease)   . Hyperlipidemia   . Hypertension     PAST SURGICAL HISTORY:  Past Surgical History:  Procedure Laterality Date  . APPENDECTOMY    . COLONOSCOPY WITH PROPOFOL N/A 07/18/2017   Procedure: COLONOSCOPY WITH PROPOFOL;  Surgeon: [name], MD;  Location: [facility] ENDOSCOPY;  Service: Endoscopy;  Laterality: N/A;  . CORONARY ANGIOPLASTY    . coronary stents      x 2  . KNEE ARTHROPLASTY Left 07/20/2017   Procedure: COMPUTER ASSISTED TOTAL KNEE ARTHROPLASTY;  Surgeon: [name], MD;  Location: [facility] ORS;  Service: Orthopedics;  Laterality: Left;  . ROBOT ASSISTED LAPAROSCOPIC RADICAL PROSTATECTOMY N/A 07/16/2013   Procedure: ROBOTIC ASSISTED LAPAROSCOPIC RADICAL PROSTATECTOMY LEVEL 2, bilateral pelvic lymphadenectomy;  Surgeon: [name], MD;  Location: [facility] ORS;  Service: Urology;  Laterality: N/A;  . TONSILLECTOMY     and adenoidectomy     FAMILY HISTORY: Family History  Problem Relation Age of Onset  . Cancer Mother 31       cervical cancer  . Heart attack Father   . Thyroid cancer Daughter 26       doing well    ADVANCED DIRECTIVES (Y/N):  N  HEALTH MAINTENANCE: Social History   Tobacco Use  . Smoking status: Former Smoker    Packs/day: 1.00    Years: 30.00    Pack years: 30.00    Types: Cigarettes    Last attempt to quit: 11/01/1992    Years since quitting: 26.3  . Smokeless tobacco: Never Used  Substance Use Topics  . Alcohol use: No  . Drug use: No     Colonoscopy:  PAP:  Bone density:  Lipid panel:  Allergies  Allergen Reactions  . Ampicillin Other (See Comments)    Raw mouth and throat   . Atorvastatin     Other reaction(s): Unknown  . Carvedilol     Other reaction(s): Dizziness  . Contrast Media [Iodinated Diagnostic Agents] Hives  . Diltiazem Hcl Other (See Comments)  . Fluvastatin Other (See Comments)  . Lovastatin Other (See Comments)  . Sulfa Antibiotics Hives  . Betadine [Povidone Iodine] Rash    Current Outpatient Medications  Medication Sig Dispense Refill  . acetaminophen (TYLENOL) 500 MG  tablet Take 1,000 mg by mouth 2 (two) times daily.     [name] aspirin EC 81 MG tablet Take 81 mg by mouth daily.    [name] atenolol (TENORMIN) 100 MG tablet Take 100 mg by mouth every morning.    . Calcium Carbonate-Vit D-Min (CALTRATE 600+D PLUS PO) Take 1 tablet by mouth daily.     . Coenzyme Q10 (COQ10) 100 MG CAPS Take 100 mg by mouth daily.    . felodipine (PLENDIL) 10 MG 24 hr tablet Take 10 mg by mouth at bedtime. Patient takes at nite    . folic acid (FOLVITE) 283 MCG tablet Take 800 mcg by mouth daily.    . hydrochlorothiazide (HYDRODIURIL) 25 MG tablet Take 25 mg by mouth as needed.    [name] lisinopril-hydrochlorothiazide (PRINZIDE,ZESTORETIC) 20-12.5 MG per tablet Take 1 tablet by mouth every morning.    . niacin 500 MG tablet Take 1,000 mg by mouth 2 (two) times daily with a meal.    . omeprazole (PRILOSEC) 20 MG capsule Take 20 mg by mouth daily.    . simvastatin (ZOCOR) 40 MG tablet Take 40 mg by mouth at bedtime.  3  . vitamin B-12 (CYANOCOBALAMIN) 1000 MCG tablet Take 1,000 mcg by mouth daily.    . vitamin E 400 UNIT capsule Take 400 Units by mouth daily.    [name] Phenylephrine-APAP-Guaifenesin (MUCINEX SINUS-MAX CONGESTION) 5-325-200 MG TABS Take 2 tablets by mouth as needed.    . triamcinolone (NASACORT) 55 MCG/ACT AERO nasal inhaler Place 2 sprays into the nose daily.      No current facility-administered medications for this visit.    Facility-Administered Medications Ordered in Other Visits  Medication Dose Route Frequency Provider Last Rate Last Dose  . Leuprolide Acetate (6 Month) (LUPRON) injection 45 mg  45 mg Intramuscular Once Lloyd Huger, MD        OBJECTIVE: Vitals:   02/23/19 1045  BP: 104/73  Pulse: 63  Temp: 97.9 F (36.6 C)     Body mass index is 31.74 kg/m.    ECOG FS:0 - Asymptomatic  General: Well-developed, well-nourished, no acute distress. Eyes: Pink conjunctiva, anicteric sclera. HEENT: Normocephalic, moist mucous membranes. Lungs: Clear to  auscultation bilaterally. Heart: Regular rate and rhythm. No rubs, murmurs, or gallops. Abdomen: Soft, nontender, nondistended. No organomegaly noted, normoactive bowel sounds. Musculoskeletal: No edema, cyanosis, or clubbing. Neuro: Alert, answering all questions appropriately. Cranial nerves grossly intact. Skin: No rashes or petechiae noted. Psych: Normal affect.  LAB RESULTS:  Lab Results  Component Value Date   NA 137 02/23/2019   K 4.1 02/23/2019   CL 105 02/23/2019   CO2 25 02/23/2019   GLUCOSE 122 (H) 02/23/2019   BUN 22 02/23/2019   CREATININE 1.50 (H) 02/23/2019   CALCIUM 9.4 02/23/2019   PROT 6.8 07/06/2017   ALBUMIN 4.1 07/06/2017   AST 20 07/06/2017   ALT 20 07/06/2017   ALKPHOS 46 07/06/2017   BILITOT 1.2 07/06/2017  GFRNONAA 47 (L) 02/23/2019   GFRAA 55 (L) 02/23/2019    Lab Results  Component Value Date   WBC 9.4 07/22/2017   HGB 13.2 07/22/2017   HCT 37.6 (L) 07/22/2017   MCV 94.1 07/22/2017   PLT 167 07/22/2017     STUDIES: No results found.  ASSESSMENT: Stage IIIb prostate cancer  PLAN:    1.  Stage IIIb prostate cancer: Patient underwent radical prostatectomy on July 06, 2013.  Final pathology reported Gleason 7 (4+3), extracapsular extension, but margins were clear.  Seminal vesicles were not involved and 0 of 8 lymph nodes were negative for disease.  Patient noted to have a rising PSA and underwent salvage XRT in Spring of 2017.  Nuclear med bone scan on May 30, 2017 revealed improvement of uptake in the right posterior inferior pubic ramus and distal left femoral metadiaphysis.  PET scan on October 31, 2018 revealed no evidence of disease.  His PSA continued to trend up and his most recent value was 2.94, today's result is pending.  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 Patient reinitiated Lupron on November 02, 2018.  Proceed with 45 mg intramuscular  Lupron today.  Return to clinic in 3 months for laboratory work only and then in 6 months for further evaluation and continuation of treatment.  I spent a total of 30 minutes face-to-face with the patient of which greater than 50% of the visit was spent in counseling and coordination of care as detailed above.  Patient expressed understanding and was in agreement with this plan. He also understands that He can call clinic at any time with any questions, concerns, or complaints.   Cancer Staging Prostate cancer [facility]) Staging form: Prostate, AJCC 8th Edition - Clinical stage from 07/08/2018: Stage IIIB (cT3a, cN0, cM0, Grade Group: 3) - Signed by Lloyd Huger, MD on 07/08/2018   Lloyd Huger, MD   02/23/2019 12:38 PM

## 2019-02-23 NOTE — Progress Notes (Signed)
Radiation Oncology Follow up Note  Name: Cody Ellison.   Date:   02/23/2019 MRN:  [id number] DOB: 13-Sep-1951    This 68 y.o. male presents to the clinic today for 3-year follow-up status post salvage radiation therapy to his prostatic fossa in a patient with known Gleason 7 adenocarcinoma the prostate.  REFERRING PROVIDER: [name], MD  HPI: Patient is a 68 year old male now about 3 years having completed salvage radiation therapy after biochemical failure status post prostatectomy for Gleason 7 adenocarcinoma the prostate.[name]  His last PSA back in January was 2.9.  He had a PSA performed today is not available.  He is currently under androgen deprivation therapy by medical oncology.  He specifically denies diarrhea dysuria or any other GI/GU complaints.  He had an PET scan back in December showing no evidence to suggest metastatic disease.  He is receiving a second Lupron injection today.  He specifically denies bone pain.  COMPLICATIONS OF TREATMENT: none  FOLLOW UP COMPLIANCE: keeps appointments   PHYSICAL EXAM:  BP (P) 105/78   Pulse (P) 64   Temp (!) (P) 97.5 F (36.4 C) (Tympanic)   Resp (P) 16   Wt (P) 214 lb 15.2 oz (97.5 kg)   BMI (P) 31.29 kg/m  Well-developed well-nourished patient in NAD. HEENT reveals PERLA, EOMI, discs not visualized.  Oral cavity is clear. No oral mucosal lesions are identified. Neck is clear without evidence of cervical or supraclavicular adenopathy. Lungs are clear to A&P. Cardiac examination is essentially unremarkable with regular rate and rhythm without murmur rub or thrill. Abdomen is benign with no organomegaly or masses noted. Motor sensory and DTR levels are equal and symmetric in the upper and lower extremities. Cranial nerves II through XII are grossly intact. Proprioception is intact. No peripheral adenopathy or edema is identified. No motor or sensory levels are noted. Crude visual fields are within normal range.  RADIOLOGY RESULTS:  PET scan reviewed and compatible with above-stated findings  PLAN: At the present time he is doing well from a symptom standpoint.  He continues on androgen deprivation therapy will receive Lupron injection today.  Otherwise I will review his PSA when it becomes available.  I have asked to see back in 6 months for follow-up.  Patient is to call with any problems with bone or joint pain.  He continues close follow-up care with medical oncology.  I would like to take this opportunity to thank you for allowing me to participate in the care of your patient.Noreene Filbert, MD

## 2019-05-25 ENCOUNTER — Other Ambulatory Visit: Payer: Self-pay

## 2019-05-25 ENCOUNTER — Inpatient Hospital Stay: Payer: Medicare Other | Attending: Oncology

## 2019-05-25 DIAGNOSIS — C61 Malignant neoplasm of prostate: Secondary | ICD-10-CM | POA: Insufficient documentation

## 2019-05-25 LAB — BASIC METABOLIC PANEL
Anion gap: 11 (ref 5–15)
BUN: 20 mg/dL (ref 8–23)
CO2: 25 mmol/L (ref 22–32)
Calcium: 9.7 mg/dL (ref 8.9–10.3)
Chloride: 105 mmol/L (ref 98–111)
Creatinine, Ser: 1.19 mg/dL (ref 0.61–1.24)
GFR calc Af Amer: >60 mL/min (ref >60)
GFR calc non Af Amer: >60 mL/min (ref >60)
Glucose, Bld: 141 mg/dL — ABNORMAL HIGH (ref 70–99)
Potassium: 3.9 mmol/L (ref 3.5–5.1)
Sodium: 141 mmol/L (ref 135–145)

## 2019-05-25 LAB — PSA: Prostatic Specific Antigen: 0.23 ng/mL (ref 0.00–4.00)

## 2019-08-19 DIAGNOSIS — M1711 Unilateral primary osteoarthritis, right knee: Secondary | ICD-10-CM | POA: Insufficient documentation

## 2019-08-23 NOTE — Progress Notes (Signed)
[place]  Telephone:(336) [phone] Fax:(336) [phone]  ID: Cody Ellison. OB: 07/16/1951  MR#: [id number]  [id number]  Patient Care Team: [name], MD as PCP - General (Internal Medicine)  CHIEF COMPLAINT: Stage IIIb prostate cancer  INTERVAL HISTORY: Patient returns to clinic today for routine 13-month evaluation and continuation of Lupron.  He continues to have occasional hot flashes, but these do not affect his day-to-day activity.  He otherwise feels well and is asymptomatic. He denies any pain.  He has no neurologic complaints.  He denies any recent fevers or illnesses.  He has a good appetite and denies weight loss.  He denies any chest pain, shortness of breath, cough, or hemoptysis.  He denies any nausea, vomiting, constipation, or diarrhea.  He has no urinary complaints.  Patient feels at his baseline offers no further specific complaints today.  REVIEW OF SYSTEMS:   Review of Systems  Constitutional: Negative.  Negative for fever, malaise/fatigue and weight loss.  Respiratory: Negative.  Negative for cough, hemoptysis and shortness of breath.   Cardiovascular: Negative.  Negative for chest pain and leg swelling.  Gastrointestinal: Negative.  Negative for abdominal pain.  Genitourinary: Negative.  Negative for dysuria and hematuria.  Musculoskeletal: Negative.  Negative for back pain.  Skin: Negative.  Negative for rash.  Neurological: Negative.  Negative for dizziness, sensory change, focal weakness, weakness and headaches.  Psychiatric/Behavioral: Negative.  The patient is not nervous/anxious.     As per HPI. Otherwise, a complete review of systems is negative.  PAST MEDICAL HISTORY: Past Medical History:  Diagnosis Date  . Arthritis    lower back   . Cancer [facility])    prostate cancer   . Coronary artery disease    stent RCA- 2000 and 12/2009   . GERD (gastroesophageal reflux disease)   . Hyperlipidemia   . Hypertension     PAST  SURGICAL HISTORY: Past Surgical History:  Procedure Laterality Date  . APPENDECTOMY    . COLONOSCOPY WITH PROPOFOL N/A 07/18/2017   Procedure: COLONOSCOPY WITH PROPOFOL;  Surgeon: [name], MD;  Location: [facility] ENDOSCOPY;  Service: Endoscopy;  Laterality: N/A;  . CORONARY ANGIOPLASTY    . coronary stents      x 2  . KNEE ARTHROPLASTY Left 07/20/2017   Procedure: COMPUTER ASSISTED TOTAL KNEE ARTHROPLASTY;  Surgeon: [name], MD;  Location: [facility] ORS;  Service: Orthopedics;  Laterality: Left;  . ROBOT ASSISTED LAPAROSCOPIC RADICAL PROSTATECTOMY N/A 07/16/2013   Procedure: ROBOTIC ASSISTED LAPAROSCOPIC RADICAL PROSTATECTOMY LEVEL 2, bilateral pelvic lymphadenectomy;  Surgeon: [name], MD;  Location: [facility] ORS;  Service: Urology;  Laterality: N/A;  . TONSILLECTOMY     and adenoidectomy     FAMILY HISTORY: Family History  Problem Relation Age of Onset  . Cancer Mother 54       cervical cancer  . Heart attack Father   . Thyroid cancer Daughter 33       doing well    ADVANCED DIRECTIVES (Y/N):  N  HEALTH MAINTENANCE: Social History   Tobacco Use  . Smoking status: Former Smoker    Packs/day: 1.00    Years: 30.00    Pack years: 30.00    Types: Cigarettes    Quit date: 11/01/1992    Years since quitting: 26.8  . Smokeless tobacco: Never Used  Substance Use Topics  . Alcohol use: No  . Drug use: No     Colonoscopy:  PAP:  Bone density:  Lipid panel:  Allergies  Allergen Reactions  . Ampicillin Other (See Comments)    Raw mouth and throat   . Atorvastatin     Other reaction(s): Unknown  . Carvedilol     Other reaction(s): Dizziness  . Contrast Media [Iodinated Diagnostic Agents] Hives  . Diltiazem Hcl Other (See Comments)  . Fluvastatin Other (See Comments)  . Lovastatin Other (See Comments)  . Sulfa Antibiotics Hives  . Betadine [Povidone Iodine] Rash    Current Outpatient Medications  Medication Sig Dispense Refill  . acetaminophen (TYLENOL) 500  MG tablet Take 1,000 mg by mouth 2 (two) times daily.     [name] aspirin EC 81 MG tablet Take 81 mg by mouth daily.    [name] atenolol (TENORMIN) 100 MG tablet Take 100 mg by mouth every morning.    . Calcium Carbonate-Vit D-Min (CALTRATE 600+D PLUS PO) Take 1 tablet by mouth daily.     . Coenzyme Q10 (COQ10) 100 MG CAPS Take 100 mg by mouth daily.    . felodipine (PLENDIL) 10 MG 24 hr tablet Take 10 mg by mouth at bedtime. Patient takes at nite    . folic acid (FOLVITE) [id number] MCG tablet Take 800 mcg by mouth daily.    . hydrochlorothiazide (HYDRODIURIL) 25 MG tablet Take 25 mg by mouth as needed.    [name] lisinopril-hydrochlorothiazide (PRINZIDE,ZESTORETIC) 20-12.5 MG per tablet Take 1 tablet by mouth every morning.    . niacin 500 MG tablet Take 1,000 mg by mouth 2 (two) times daily with a meal.    . omeprazole (PRILOSEC) 20 MG capsule Take 20 mg by mouth daily.    . simvastatin (ZOCOR) 40 MG tablet Take 40 mg by mouth at bedtime.  3  . triamcinolone (NASACORT) 55 MCG/ACT AERO nasal inhaler Place 2 sprays into the nose daily.     . vitamin B-12 (CYANOCOBALAMIN) 1000 MCG tablet Take 1,000 mcg by mouth daily.    . vitamin E 400 UNIT capsule Take 400 Units by mouth daily.    [name] Phenylephrine-APAP-Guaifenesin (MUCINEX SINUS-MAX CONGESTION) 5-325-200 MG TABS Take 2 tablets by mouth as needed.     No current facility-administered medications for this visit.    Facility-Administered Medications Ordered in Other Visits  Medication Dose Route Frequency Provider Last Rate Last Dose  . leuprolide (6 Month) (ELIGARD) injection 45 mg  45 mg Subcutaneous Once Lloyd Huger, MD      . Leuprolide Acetate (6 Month) (LUPRON) injection 45 mg  45 mg Intramuscular Once Lloyd Huger, MD        OBJECTIVE: Vitals:   08/24/19 1032  BP: 108/75  Pulse: 63  Resp: 16  Temp: (!) 97 F (36.1 C)     Body mass index is 31.56 kg/m.    ECOG FS:0 - Asymptomatic  General: Well-developed, well-nourished, no acute  distress. Eyes: Pink conjunctiva, anicteric sclera. HEENT: Normocephalic, moist mucous membranes. Lungs: Clear to auscultation bilaterally. Heart: Regular rate and rhythm. No rubs, murmurs, or gallops. Abdomen: Soft, nontender, nondistended. No organomegaly noted, normoactive bowel sounds. Musculoskeletal: No edema, cyanosis, or clubbing. Neuro: Alert, answering all questions appropriately. Cranial nerves grossly intact. Skin: No rashes or petechiae noted. Psych: Normal affect.  LAB RESULTS:  Lab Results  Component Value Date   NA 139 08/24/2019   K 4.1 08/24/2019   CL 105 08/24/2019   CO2 27 08/24/2019   GLUCOSE 128 (H) 08/24/2019   BUN 23 08/24/2019   CREATININE 1.31 (H) 08/24/2019   CALCIUM 10.1 08/24/2019   PROT  6.8 07/06/2017   ALBUMIN 4.1 07/06/2017   AST 20 07/06/2017   ALT 20 07/06/2017   ALKPHOS 46 07/06/2017   BILITOT 1.2 07/06/2017   GFRNONAA 56 (L) 08/24/2019   GFRAA >60 08/24/2019    Lab Results  Component Value Date   WBC 9.4 07/22/2017   HGB 13.2 07/22/2017   HCT 37.6 (L) 07/22/2017   MCV 94.1 07/22/2017   PLT 167 07/22/2017     STUDIES: No results found.  ASSESSMENT: Stage IIIb prostate cancer  PLAN:    1.  Stage IIIb prostate cancer: Patient underwent radical prostatectomy on July 06, 2013.  Final pathology reported Gleason 7 (4+3), extracapsular extension, but margins were clear.  Seminal vesicles were not involved and 0 of 8 lymph nodes were negative for disease.  Patient noted to have a rising PSA and underwent salvage XRT in Spring of 2017.  Nuclear med bone scan on May 30, 2017 revealed improvement of uptake in the right posterior inferior pubic ramus and distal left femoral metadiaphysis.  PET scan on October 31, 2018 revealed no evidence of disease.  Patient's PSA initially trended up to 2.94, but since initiating Lupron, his most recent PSA is now 0.34.  This has trended up slightly from previous.  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 Patient reinitiated Lupron on November 02, 2018.  Proceed with 45 mg intramuscular Lupron today.  Return to clinic in 6 months with repeat laboratory work, further evaluation, and continuation of treatment. 2.  Renal insufficiency: Patient's creatinine is mildly elevated at 1.31, but this appears to be his baseline.  I spent a total of 30 minutes face-to-face with the patient of which greater than 50% of the visit was spent in counseling and coordination of care as detailed above.   Patient expressed understanding and was in agreement with this plan. He also understands that He can call clinic at any time with any questions, concerns, or complaints.   Cancer Staging Prostate cancer [facility]) Staging form: Prostate, AJCC 8th Edition - Clinical stage from 07/08/2018: Stage IIIB (cT3a, cN0, cM0, Grade Group: 3) - Signed by Lloyd Huger, MD on 07/08/2018   Lloyd Huger, MD   08/25/2019 7:26 AM

## 2019-08-23 NOTE — Progress Notes (Signed)
Called patient no answer left message  

## 2019-08-24 ENCOUNTER — Other Ambulatory Visit: Payer: Self-pay

## 2019-08-24 ENCOUNTER — Encounter: Payer: Self-pay | Admitting: Oncology

## 2019-08-24 ENCOUNTER — Inpatient Hospital Stay (HOSPITAL_BASED_OUTPATIENT_CLINIC_OR_DEPARTMENT_OTHER): Payer: Medicare Other | Admitting: Oncology

## 2019-08-24 ENCOUNTER — Inpatient Hospital Stay: Payer: Medicare Other | Attending: Oncology

## 2019-08-24 ENCOUNTER — Inpatient Hospital Stay: Payer: Medicare Other

## 2019-08-24 VITALS — BP 108/75 | HR 63 | Temp 97.0°F | Resp 16 | Wt 213.7 lb

## 2019-08-24 DIAGNOSIS — C61 Malignant neoplasm of prostate: Secondary | ICD-10-CM

## 2019-08-24 DIAGNOSIS — Z5111 Encounter for antineoplastic chemotherapy: Secondary | ICD-10-CM | POA: Insufficient documentation

## 2019-08-24 LAB — BASIC METABOLIC PANEL
Anion gap: 7 (ref 5–15)
BUN: 23 mg/dL (ref 8–23)
CO2: 27 mmol/L (ref 22–32)
Calcium: 10.1 mg/dL (ref 8.9–10.3)
Chloride: 105 mmol/L (ref 98–111)
Creatinine, Ser: 1.31 mg/dL — ABNORMAL HIGH (ref 0.61–1.24)
GFR calc Af Amer: >60 mL/min (ref >60)
GFR calc non Af Amer: 56 mL/min — ABNORMAL LOW (ref >60)
Glucose, Bld: 128 mg/dL — ABNORMAL HIGH (ref 70–99)
Potassium: 4.1 mmol/L (ref 3.5–5.1)
Sodium: 139 mmol/L (ref 135–145)

## 2019-08-24 LAB — PSA: Prostatic Specific Antigen: 0.34 ng/mL (ref 0.00–4.00)

## 2019-08-24 MED ORDER — LEUPROLIDE ACETATE (6 MONTH) 45 MG IM KIT
45.0000 mg | PACK | Freq: Once | INTRAMUSCULAR | Status: AC
  Filled 2019-08-24: qty 45

## 2019-08-24 MED ORDER — LEUPROLIDE ACETATE (6 MONTH) 45 MG ~~LOC~~ KIT
45.0000 mg | PACK | Freq: Once | SUBCUTANEOUS | Status: DC
  Filled 2019-08-24: qty 45

## 2019-08-24 MED ORDER — LEUPROLIDE ACETATE (6 MONTH) 45 MG ~~LOC~~ KIT
45.0000 mg | PACK | Freq: Once | SUBCUTANEOUS | Status: AC
  Administered 2019-08-24: 45 mg via SUBCUTANEOUS
  Filled 2019-08-24: qty 45

## 2019-08-24 NOTE — Progress Notes (Signed)
Pt denies any concerns today.  Reports having knee surgery in March.

## 2019-08-29 ENCOUNTER — Other Ambulatory Visit: Payer: Self-pay

## 2019-08-30 ENCOUNTER — Encounter: Payer: Self-pay | Admitting: Radiation Oncology

## 2019-08-30 ENCOUNTER — Other Ambulatory Visit: Payer: Self-pay

## 2019-08-30 ENCOUNTER — Ambulatory Visit
Admission: RE | Admit: 2019-08-30 | Discharge: 2019-08-30 | Disposition: A | Discharge status: Home / Self Care | Payer: Medicare Other | Source: Ambulatory Visit | Attending: Radiation Oncology | Admitting: Radiation Oncology

## 2019-08-30 VITALS — BP 108/72 | HR 68 | Temp 97.6°F

## 2019-08-30 DIAGNOSIS — C61 Malignant neoplasm of prostate: Secondary | ICD-10-CM | POA: Insufficient documentation

## 2019-08-30 DIAGNOSIS — Z923 Personal history of irradiation: Secondary | ICD-10-CM | POA: Insufficient documentation

## 2019-08-30 NOTE — Progress Notes (Signed)
Radiation Oncology Follow up Note  Name: Cody Ellison.   Date:   08/30/2019 MRN:  [id number] DOB: 1951/06/07    This 68 y.o. male presents to the clinic today for 3-1/2-year follow-up status post salvage radiation therapy status post prostatectomy for Gleason 7 adenocarcinoma.[name]  REFERRING PROVIDER: [name], MD  HPI: Patient is a 68 year old male now about 3-1/2 years having completed salvage radiation therapy to his prostatic fossa status post robotic assisted prostatectomy for Gleason 7 adenocarcinoma the prostate with biochemical failure.[name]  His PSA recently was 0.34 up from 0.186 months prior he is currently on androgen deprivation therapy by medical oncology.  He specifically denies any increased lower urinary tract symptoms or diarrhea..  Patient is having no bone pain at this time.  COMPLICATIONS OF TREATMENT: none  FOLLOW UP COMPLIANCE: keeps appointments   PHYSICAL EXAM:  BP 108/72   Pulse 68   Temp 97.6 F (36.4 C)  Well-developed well-nourished patient in NAD. HEENT reveals PERLA, EOMI, discs not visualized.  Oral cavity is clear. No oral mucosal lesions are identified. Neck is clear without evidence of cervical or supraclavicular adenopathy. Lungs are clear to A&P. Cardiac examination is essentially unremarkable with regular rate and rhythm without murmur rub or thrill. Abdomen is benign with no organomegaly or masses noted. Motor sensory and DTR levels are equal and symmetric in the upper and lower extremities. Cranial nerves II through XII are grossly intact. Proprioception is intact. No peripheral adenopathy or edema is identified. No motor or sensory levels are noted. Crude visual fields are within normal range.  RADIOLOGY RESULTS: No current films to review  PLAN: Present time patient is doing well.  He is currently under androgen deprivation therapy by Dr. Grayland Ormond.  He continues close follow-up in his office.  I have asked to see him back in 1 year for  follow-up.  Patient knows to call with any concerns at any time.  I would like to take this opportunity to thank you for allowing me to participate in the care of your patient.Noreene Filbert, MD

## 2020-01-10 ENCOUNTER — Encounter
Admission: RE | Admit: 2020-01-10 | Discharge: 2020-01-10 | Disposition: A | Discharge status: Home / Self Care | Payer: Medicare Other | Source: Ambulatory Visit | Attending: Orthopedic Surgery | Admitting: Orthopedic Surgery

## 2020-01-10 ENCOUNTER — Other Ambulatory Visit: Payer: Self-pay

## 2020-01-10 HISTORY — DX: Unspecified malignant neoplasm of skin, unspecified: C44.90

## 2020-01-10 NOTE — Discharge Instructions (Signed)
Instructions after Total Knee Replacement   Florencia Zaccaro P. Altan Kraai, Jr., M.D.     Dept. of Orthopaedics & Sports Medicine  [facility]  [address]  [place], [place]  [address]  Phone: [phone]   Fax: [phone]    DIET: Drink plenty of non-alcoholic fluids. Resume your normal diet. Include foods high in fiber.  ACTIVITY:  You may use crutches or a walker with weight-bearing as tolerated, unless instructed otherwise. You may be weaned off of the walker or crutches by your Physical Therapist.  Do NOT place pillows under the knee. Anything placed under the knee could limit your ability to straighten the knee.   Continue doing gentle exercises. Exercising will reduce the pain and swelling, increase motion, and prevent muscle weakness.   Please continue to use the TED compression stockings for 6 weeks. You may remove the stockings at night, but should reapply them in the morning. Do not drive or operate any equipment until instructed.  WOUND CARE:  Continue to use the PolarCare or ice packs periodically to reduce pain and swelling. You may bathe or shower after the staples are removed at the first office visit following surgery.  MEDICATIONS: You may resume your regular medications. Please take the pain medication as prescribed on the medication. Do not take pain medication on an empty stomach. You have been given a prescription for a blood thinner (Lovenox or Coumadin). Please take the medication as instructed. (NOTE: After completing a 2 week course of Lovenox, take one Enteric-coated aspirin once a day. This along with elevation will help reduce the possibility of phlebitis in your operated leg.) Do not drive or drink alcoholic beverages when taking pain medications.  CALL THE OFFICE FOR: Temperature above 101 degrees Excessive bleeding or drainage on the dressing. Excessive swelling, coldness, or paleness of the toes. Persistent nausea and vomiting.  FOLLOW-UP:  You  should have an appointment to return to the office in 10-14 days after surgery. Arrangements have been made for continuation of Physical Therapy (either home therapy or outpatient therapy).   [facility] Department Directory         [email or web address]       [email or web address]          Cardiology  Appointments: [place] - [phone] [place] - [phone]  Endocrinology  Appointments: [place] - [phone] [place] - [phone]  Gastroenterology  Appointments: [place] - [phone] [place] - [phone]        General Surgery   Appointments: [place] - [phone]  Internal Medicine/Family Medicine  Appointments: [place] - [phone] [place] - [phone] [place] - [phone]  Metabolic and Weigh Loss Surgery  Appointments: [place] - [phone]        Neurology  Appointments: [place] - [phone] [place] - [phone]  Neurosurgery  Appointments: [place] - [phone]  Obstetrics & Gynecology  Appointments: [place] - [phone] [place] - [phone]        Pediatrics  Appointments: [place] - [phone] [place] - [phone]  Physiatry  Appointments: [employer] -[phone]  Physical Therapy  Appointments: [place] - [phone] [place] - [phone]        Podiatry  Appointments: [place] - [phone] [place] - [phone]  Pulmonology  Appointments: [place] - [phone]  Rheumatology  Appointments:  - [phone]         Location: [facility]  [address] , [place]  [address]  [place] Location: [facility] [address] [place], [place]  [address]  [place] Location: [facility] [address] [place], [place]  [address]    

## 2020-01-10 NOTE — Patient Instructions (Addendum)
Your procedure is scheduled on: 01/23/20 Report to [place]. To find out your arrival time please call [phone] between 1PM - 3PM on 01/22/20.  Remember: Instructions that are not followed completely may result in serious medical risk, up to and including death, or upon the discretion of your surgeon and anesthesiologist your surgery may need to be rescheduled.     _X__ 1. Do not eat food after midnight the night before your procedure.                 No gum chewing or hard candies. You may drink clear liquids up to 2 hours                 before you are scheduled to arrive for your surgery-                  Clear Liquids include:  water, apple juice without pulp, clear carbohydrate                 drink such as Clearfast or Gatorade, Black Coffee or Tea (Do not add                 anything to coffee or tea).       Diabetics water only  __X__2.  On the morning of surgery brush your teeth with toothpaste and water, you                 may rinse your mouth with mouthwash if you wish.  Do not swallow any              toothpaste of mouthwash.     _X__ 3.  No Alcohol for 24 hours before or after surgery.   _X__ 4.  Do Not Smoke or use e-cigarettes For 24 Hours Prior to Your Surgery.                 Do not use any chewable tobacco products for at least 6 hours prior to                 surgery.  ____  5.  Bring all medications with you on the day of surgery if instructed.   __X__  6.  Notify your doctor if there is any change in your medical condition      (cold, fever, infections).     Do not wear jewelry, make-up, hairpins, clips or nail polish. Do not wear lotions, powders, or perfumes.  Do not shave 48 hours prior to surgery. Men may shave face and neck. Do not bring valuables to the hospital.    [facility] is not responsible for any belongings or valuables.  Contacts, dentures/partials or body piercings may not be worn into  surgery. Bring a case for your contacts, glasses or hearing aids, a denture cup will be supplied. Leave your suitcase in the car. After surgery it may be brought to your room. For patients admitted to the hospital, discharge time is determined by your treatment team.   Patients discharged the day of surgery will not be allowed to drive home.   Please read over the following fact sheets that you were given:   MRSA Information  __X__ Take these medicines the morning of surgery with A SIP OF WATER:    1. atenolol (TENORMIN) 100 MG tablet  2. omeprazole (PRILOSEC) 20 MG capsule  3.   4.  5.  6.  ____ Fleet Enema (as directed)   __X__ Use CHG Soap/SAGE wipes as directed  ____ Use inhalers on the day of surgery  ____ Stop metformin/Janumet/Farxiga 2 days prior to surgery    ____ Take 1/2 of usual insulin dose the night before surgery. No insulin the morning          of surgery.   ____ Stop Blood Thinners Coumadin/Plavix/Xarelto/Pleta/Pradaxa/Eliquis/Effient/Aspirin  on   Or contact your Surgeon, Cardiologist or Medical Doctor regarding  ability to stop your blood thinners  __X__ Stop Anti-inflammatories 7 days before surgery such as Advil, Ibuprofen, Motrin,  BC or Goodies Powder, Naprosyn, Naproxen, Aleve    __X__ Stop all herbal supplements, fish oil or vitamin E until after surgery.    ____ Bring C-Pap to the hospital.   Hold CO Q10, Aleve and Vitamin E for 7 days prior to Surgery  Per review with Dr Marry Guan, you do not have to hold the felodipine/Plendil  Do not take aspirin at bedtime the night before your surgery Review your Incentive Spirometer instructions. Be sure to finish the Ensure Pre Surgery drink 2 hours prior to your arrival

## 2020-01-15 ENCOUNTER — Encounter
Admission: RE | Admit: 2020-01-15 | Discharge: 2020-01-15 | Disposition: A | Discharge status: Home / Self Care | Payer: Medicare Other | Source: Ambulatory Visit | Attending: Orthopedic Surgery | Admitting: Orthopedic Surgery

## 2020-01-15 ENCOUNTER — Other Ambulatory Visit: Payer: Self-pay

## 2020-01-15 DIAGNOSIS — I10 Essential (primary) hypertension: Secondary | ICD-10-CM | POA: Insufficient documentation

## 2020-01-15 DIAGNOSIS — Z01818 Encounter for other preprocedural examination: Secondary | ICD-10-CM | POA: Diagnosis present

## 2020-01-15 DIAGNOSIS — Z87891 Personal history of nicotine dependence: Secondary | ICD-10-CM | POA: Insufficient documentation

## 2020-01-15 DIAGNOSIS — I2510 Atherosclerotic heart disease of native coronary artery without angina pectoris: Secondary | ICD-10-CM | POA: Diagnosis not present

## 2020-01-15 DIAGNOSIS — E785 Hyperlipidemia, unspecified: Secondary | ICD-10-CM | POA: Diagnosis not present

## 2020-01-15 DIAGNOSIS — Z7982 Long term (current) use of aspirin: Secondary | ICD-10-CM | POA: Diagnosis not present

## 2020-01-15 DIAGNOSIS — Z79899 Other long term (current) drug therapy: Secondary | ICD-10-CM | POA: Diagnosis not present

## 2020-01-15 DIAGNOSIS — M1711 Unilateral primary osteoarthritis, right knee: Secondary | ICD-10-CM | POA: Insufficient documentation

## 2020-01-15 LAB — URINALYSIS, ROUTINE W REFLEX MICROSCOPIC
Bilirubin Urine: NEGATIVE
Glucose, UA: NEGATIVE mg/dL
Hgb urine dipstick: NEGATIVE
Ketones, ur: NEGATIVE mg/dL
Leukocytes,Ua: NEGATIVE
Nitrite: NEGATIVE
Protein, ur: NEGATIVE mg/dL
Specific Gravity, Urine: 1.016 (ref 1.005–1.030)
pH: 7 (ref 5.0–8.0)

## 2020-01-15 LAB — CBC
HCT: 39.9 % (ref 39.0–52.0)
Hemoglobin: 13.6 g/dL (ref 13.0–17.0)
MCH: 31.3 pg (ref 26.0–34.0)
MCHC: 34.1 g/dL (ref 30.0–36.0)
MCV: 91.7 fL (ref 80.0–100.0)
Platelets: 194 10*3/uL (ref 150–400)
RBC: 4.35 MIL/uL (ref 4.22–5.81)
RDW: 13.5 % (ref 11.5–15.5)
WBC: 6.7 10*3/uL (ref 4.0–10.5)
nRBC: 0 % (ref 0.0–0.2)

## 2020-01-15 LAB — COMPREHENSIVE METABOLIC PANEL
ALT: 32 U/L (ref 0–44)
AST: 23 U/L (ref 15–41)
Albumin: 4.1 g/dL (ref 3.5–5.0)
Alkaline Phosphatase: 71 U/L (ref 38–126)
Anion gap: 8 (ref 5–15)
BUN: 21 mg/dL (ref 8–23)
CO2: 26 mmol/L (ref 22–32)
Calcium: 9.8 mg/dL (ref 8.9–10.3)
Chloride: 104 mmol/L (ref 98–111)
Creatinine, Ser: 1.08 mg/dL (ref 0.61–1.24)
GFR calc Af Amer: >60 mL/min (ref >60)
GFR calc non Af Amer: >60 mL/min (ref >60)
Glucose, Bld: 127 mg/dL — ABNORMAL HIGH (ref 70–99)
Potassium: 4.1 mmol/L (ref 3.5–5.1)
Sodium: 138 mmol/L (ref 135–145)
Total Bilirubin: 1.3 mg/dL — ABNORMAL HIGH (ref 0.3–1.2)
Total Protein: 6.7 g/dL (ref 6.5–8.1)

## 2020-01-15 LAB — PROTIME-INR
INR: 1 (ref 0.8–1.2)
Prothrombin Time: 13.3 seconds (ref 11.4–15.2)

## 2020-01-15 LAB — C-REACTIVE PROTEIN: CRP: 1.7 mg/dL — ABNORMAL HIGH (ref <1.0)

## 2020-01-15 LAB — SURGICAL PCR SCREEN
MRSA, PCR: NEGATIVE
Staphylococcus aureus: NEGATIVE

## 2020-01-15 LAB — TYPE AND SCREEN
ABO/RH(D): O POS
Antibody Screen: NEGATIVE

## 2020-01-15 LAB — SEDIMENTATION RATE: Sed Rate: 7 mm/hr (ref 0–20)

## 2020-01-15 LAB — APTT: aPTT: 28 seconds (ref 24–36)

## 2020-01-16 LAB — URINE CULTURE
Culture: NO GROWTH
Special Requests: NORMAL

## 2020-01-17 LAB — IGE: IgE (Immunoglobulin E), Serum: 37 IU/mL (ref 6–495)

## 2020-01-21 ENCOUNTER — Other Ambulatory Visit: Payer: Self-pay

## 2020-01-21 ENCOUNTER — Other Ambulatory Visit
Admission: RE | Admit: 2020-01-21 | Discharge: 2020-01-21 | Disposition: A | Discharge status: Home / Self Care | Payer: Medicare Other | Source: Ambulatory Visit | Attending: Orthopedic Surgery | Admitting: Orthopedic Surgery

## 2020-01-21 LAB — SARS CORONAVIRUS 2 (TAT 6-24 HRS): SARS Coronavirus 2: NEGATIVE

## 2020-01-23 ENCOUNTER — Inpatient Hospital Stay
Admission: RE | Admit: 2020-01-23 | Discharge: 2020-01-25 | DRG: 470 | Disposition: A | Discharge status: Home Health | Payer: Medicare Other | Attending: Orthopedic Surgery | Admitting: Orthopedic Surgery

## 2020-01-23 ENCOUNTER — Encounter
Admission: RE | Disposition: A | Discharge status: Home / Self Care | Payer: Self-pay | Source: Home / Self Care | Attending: Orthopedic Surgery

## 2020-01-23 ENCOUNTER — Inpatient Hospital Stay: Payer: Medicare Other | Admitting: Registered Nurse

## 2020-01-23 ENCOUNTER — Inpatient Hospital Stay: Payer: Medicare Other

## 2020-01-23 ENCOUNTER — Encounter: Payer: Self-pay | Admitting: Orthopedic Surgery

## 2020-01-23 ENCOUNTER — Other Ambulatory Visit: Payer: Self-pay

## 2020-01-23 DIAGNOSIS — Z882 Allergy status to sulfonamides status: Secondary | ICD-10-CM

## 2020-01-23 DIAGNOSIS — E785 Hyperlipidemia, unspecified: Secondary | ICD-10-CM | POA: Diagnosis present

## 2020-01-23 DIAGNOSIS — Z20822 Contact with and (suspected) exposure to covid-19: Secondary | ICD-10-CM | POA: Diagnosis present

## 2020-01-23 DIAGNOSIS — Z8249 Family history of ischemic heart disease and other diseases of the circulatory system: Secondary | ICD-10-CM

## 2020-01-23 DIAGNOSIS — Z881 Allergy status to other antibiotic agents status: Secondary | ICD-10-CM

## 2020-01-23 DIAGNOSIS — Z91041 Radiographic dye allergy status: Secondary | ICD-10-CM | POA: Diagnosis not present

## 2020-01-23 DIAGNOSIS — C61 Malignant neoplasm of prostate: Secondary | ICD-10-CM | POA: Diagnosis present

## 2020-01-23 DIAGNOSIS — Z8041 Family history of malignant neoplasm of ovary: Secondary | ICD-10-CM

## 2020-01-23 DIAGNOSIS — M1711 Unilateral primary osteoarthritis, right knee: Principal | ICD-10-CM | POA: Diagnosis present

## 2020-01-23 DIAGNOSIS — I2510 Atherosclerotic heart disease of native coronary artery without angina pectoris: Secondary | ICD-10-CM | POA: Diagnosis present

## 2020-01-23 DIAGNOSIS — I10 Essential (primary) hypertension: Secondary | ICD-10-CM | POA: Diagnosis present

## 2020-01-23 DIAGNOSIS — Z96659 Presence of unspecified artificial knee joint: Secondary | ICD-10-CM

## 2020-01-23 DIAGNOSIS — Z888 Allergy status to other drugs, medicaments and biological substances status: Secondary | ICD-10-CM | POA: Diagnosis not present

## 2020-01-23 DIAGNOSIS — Z7982 Long term (current) use of aspirin: Secondary | ICD-10-CM | POA: Diagnosis not present

## 2020-01-23 HISTORY — PX: KNEE ARTHROPLASTY: SHX992

## 2020-01-23 IMAGING — DX DG KNEE 1-2V PORT*R*
2 series · 2 of 2 positions shown · non-contrast
Comparison: None.

CLINICAL DATA: Postoperative evaluation.

EXAM:
PORTABLE RIGHT KNEE - 1-2 VIEW

[knee ap]
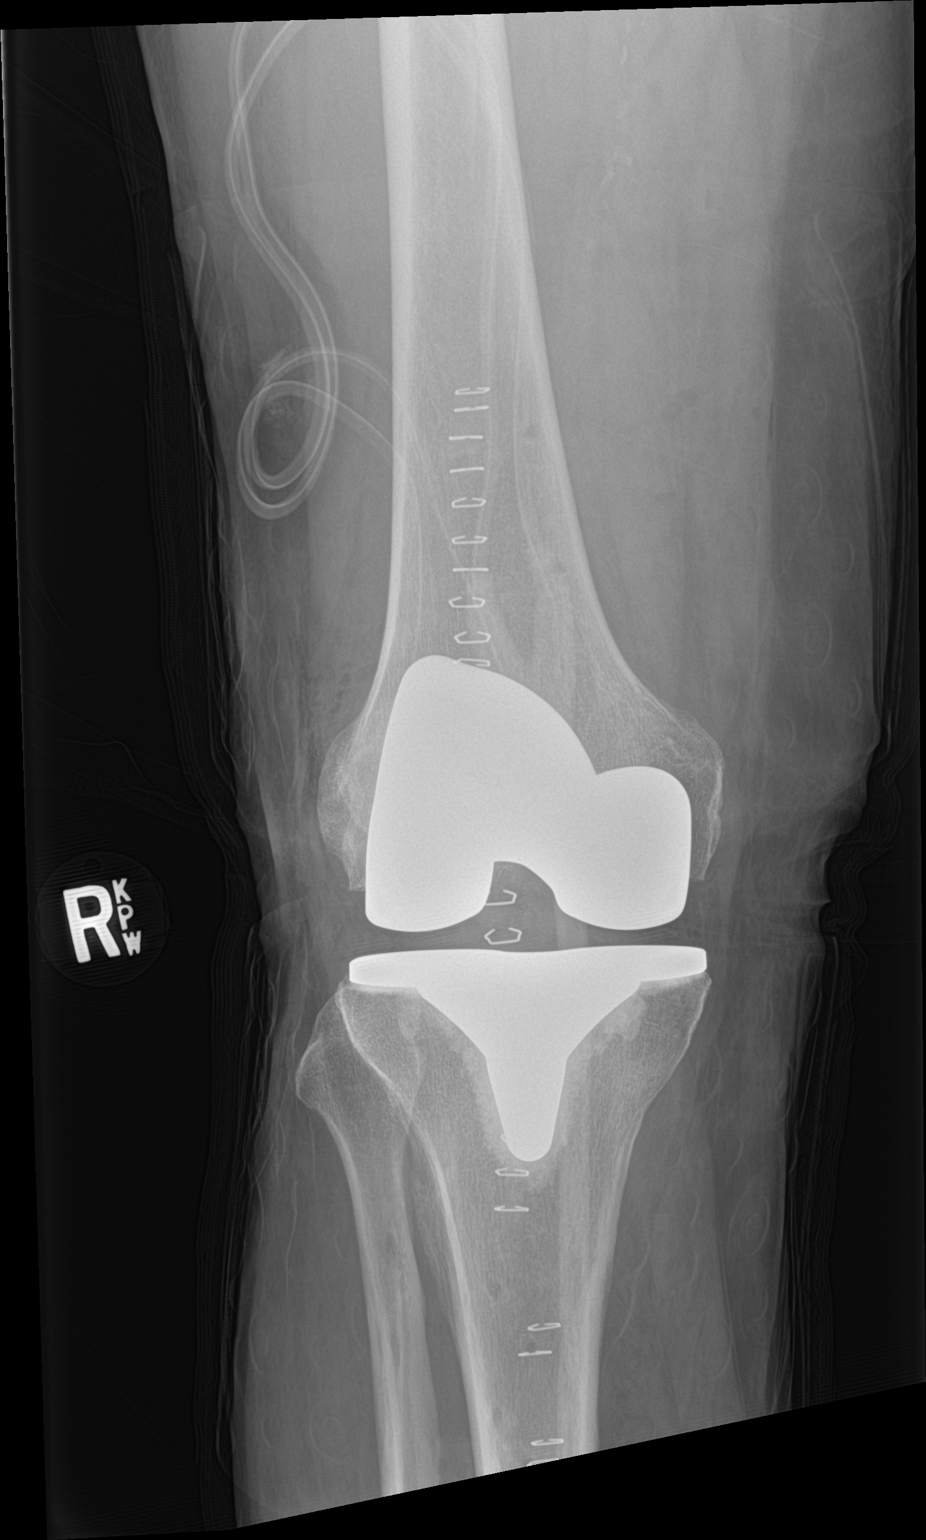

[knee lat]
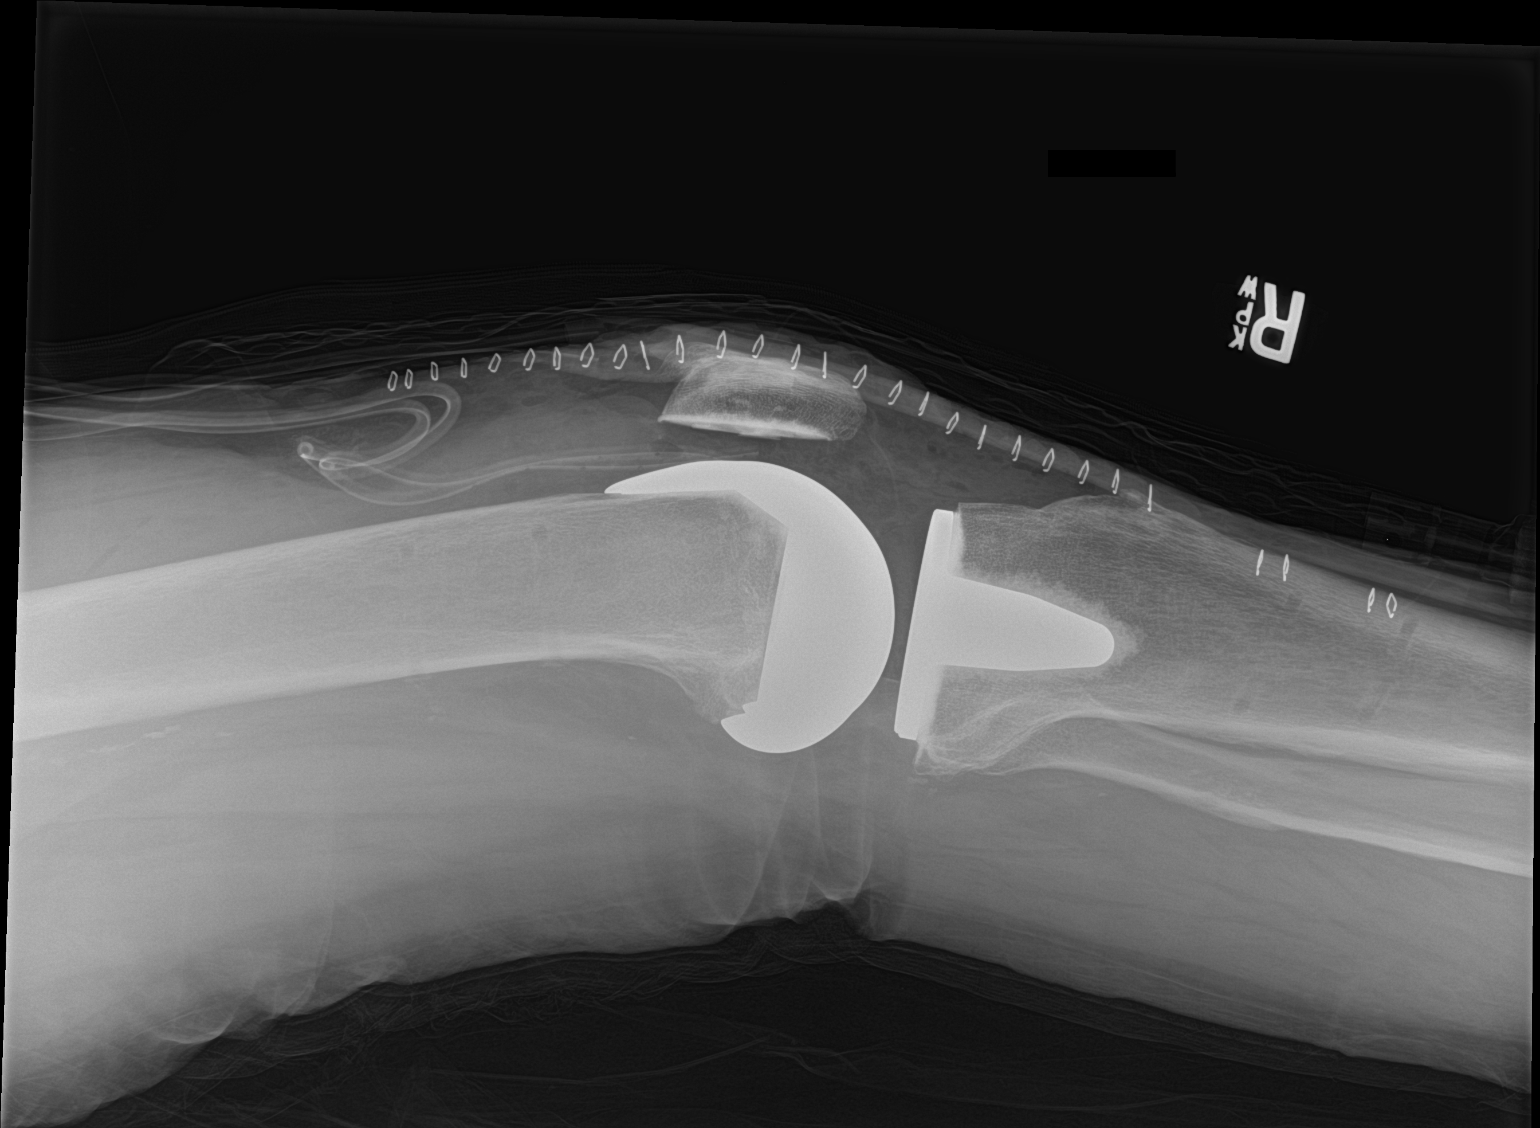

[2 of 2 positions shown; findings below may reference images not displayed]

FINDINGS: No evidence of acute fracture or dislocation. A right knee
replacement is seen without evidence of surrounding lucency to
suggest the presence of hardware loosening or infection. A moderate
sized joint effusion is seen. A surgical drain is seen extending
into this region. Multiple radiopaque skin staples are seen along
the midline of the anterior right knee.
IMPRESSION: 1. Status post right knee replacement without evidence of hardware
loosening or infection.
2. Moderate sized joint effusion.

## 2020-01-23 SURGERY — ARTHROPLASTY, KNEE, TOTAL, USING IMAGELESS COMPUTER-ASSISTED NAVIGATION
Anesthesia: Spinal | Site: Knee | Laterality: Right

## 2020-01-23 MED ORDER — PROPOFOL 500 MG/50ML IV EMUL
INTRAVENOUS | Status: AC
  Filled 2020-01-23: qty 50

## 2020-01-23 MED ORDER — CLINDAMYCIN PHOSPHATE 600 MG/50ML IV SOLN
600.0000 mg | Freq: Four times a day (QID) | INTRAVENOUS | Status: AC
  Administered 2020-01-23 – 2020-01-24 (×4): 600 mg via INTRAVENOUS
  Filled 2020-01-23 (×4): qty 50

## 2020-01-23 MED ORDER — TRANEXAMIC ACID-NACL 1000-0.7 MG/100ML-% IV SOLN
1000.0000 mg | INTRAVENOUS | Status: AC
  Administered 2020-01-23: 1000 mg via INTRAVENOUS

## 2020-01-23 MED ORDER — TRANEXAMIC ACID-NACL 1000-0.7 MG/100ML-% IV SOLN
INTRAVENOUS | Status: AC
  Filled 2020-01-23: qty 100

## 2020-01-23 MED ORDER — CALTRATE 600+D PLUS MINERALS 600-800 MG-UNIT PO CHEW: 1.0000 | CHEWABLE_TABLET | Freq: Every day | ORAL | Status: DC

## 2020-01-23 MED ORDER — DIPHENHYDRAMINE HCL 12.5 MG/5ML PO ELIX: 12.5000 mg | ORAL_SOLUTION | ORAL | Status: DC | PRN

## 2020-01-23 MED ORDER — OXYCODONE HCL 5 MG/5ML PO SOLN: 5.0000 mg | Freq: Once | ORAL | Status: DC | PRN

## 2020-01-23 MED ORDER — CLINDAMYCIN PHOSPHATE 900 MG/50ML IV SOLN
INTRAVENOUS | Status: AC
  Filled 2020-01-23: qty 50

## 2020-01-23 MED ORDER — FOLIC ACID 1 MG PO TABS
1.0000 mg | ORAL_TABLET | Freq: Every day | ORAL | Status: DC
  Administered 2020-01-23 – 2020-01-24 (×2): 1 mg via ORAL
  Filled 2020-01-23 (×2): qty 1

## 2020-01-23 MED ORDER — ACETAMINOPHEN 325 MG PO TABS: 325.0000 mg | ORAL_TABLET | Freq: Four times a day (QID) | ORAL | Status: DC | PRN

## 2020-01-23 MED ORDER — ONDANSETRON HCL 4 MG/2ML IJ SOLN: 4.0000 mg | Freq: Four times a day (QID) | INTRAMUSCULAR | Status: DC | PRN

## 2020-01-23 MED ORDER — PROPOFOL 500 MG/50ML IV EMUL
INTRAVENOUS | Status: DC | PRN
  Administered 2020-01-23: 75 ug/kg/min via INTRAVENOUS

## 2020-01-23 MED ORDER — GABAPENTIN 300 MG PO CAPS: 300.0000 mg | ORAL_CAPSULE | Freq: Once | ORAL | Status: AC

## 2020-01-23 MED ORDER — FELODIPINE ER 10 MG PO TB24
10.0000 mg | ORAL_TABLET | Freq: Every day | ORAL | Status: DC
  Administered 2020-01-23 – 2020-01-24 (×2): 10 mg via ORAL
  Filled 2020-01-23 (×3): qty 1

## 2020-01-23 MED ORDER — FERROUS SULFATE 325 (65 FE) MG PO TABS
325.0000 mg | ORAL_TABLET | Freq: Two times a day (BID) | ORAL | Status: DC
  Administered 2020-01-24 – 2020-01-25 (×3): 325 mg via ORAL
  Filled 2020-01-23 (×3): qty 1

## 2020-01-23 MED ORDER — ACETAMINOPHEN 10 MG/ML IV SOLN
INTRAVENOUS | Status: AC
  Filled 2020-01-23: qty 100

## 2020-01-23 MED ORDER — CHLORHEXIDINE GLUCONATE 4 % EX LIQD: 60.0000 mL | Freq: Once | CUTANEOUS | Status: DC

## 2020-01-23 MED ORDER — METOCLOPRAMIDE HCL 10 MG PO TABS
10.0000 mg | ORAL_TABLET | Freq: Three times a day (TID) | ORAL | Status: DC
  Administered 2020-01-23 – 2020-01-25 (×5): 10 mg via ORAL
  Filled 2020-01-23 (×6): qty 1

## 2020-01-23 MED ORDER — TRIAMCINOLONE ACETONIDE 55 MCG/ACT NA AERO
2.0000 | INHALATION_SPRAY | Freq: Every day | NASAL | Status: DC
  Administered 2020-01-24 – 2020-01-25 (×2): 2 via NASAL
  Filled 2020-01-23: qty 21.6

## 2020-01-23 MED ORDER — TRANEXAMIC ACID-NACL 1000-0.7 MG/100ML-% IV SOLN
1000.0000 mg | Freq: Once | INTRAVENOUS | Status: AC
  Administered 2020-01-23: 1000 mg via INTRAVENOUS

## 2020-01-23 MED ORDER — KETAMINE HCL 10 MG/ML IJ SOLN
INTRAMUSCULAR | Status: DC | PRN
  Administered 2020-01-23 (×3): 10 mg via INTRAVENOUS
  Administered 2020-01-23 (×2): 20 mg via INTRAVENOUS

## 2020-01-23 MED ORDER — BISACODYL 10 MG RE SUPP: 10.0000 mg | Freq: Every day | RECTAL | Status: DC | PRN

## 2020-01-23 MED ORDER — ATENOLOL 25 MG PO TABS
100.0000 mg | ORAL_TABLET | Freq: Every morning | ORAL | Status: DC
  Administered 2020-01-24 – 2020-01-25 (×2): 100 mg via ORAL
  Filled 2020-01-23 (×2): qty 4

## 2020-01-23 MED ORDER — BUPIVACAINE HCL (PF) 0.25 % IJ SOLN
INTRAMUSCULAR | Status: DC | PRN
  Administered 2020-01-23: 60 mL

## 2020-01-23 MED ORDER — CELECOXIB 200 MG PO CAPS
ORAL_CAPSULE | ORAL | Status: AC
  Administered 2020-01-23: 400 mg via ORAL
  Filled 2020-01-23: qty 2

## 2020-01-23 MED ORDER — FENTANYL CITRATE (PF) 100 MCG/2ML IJ SOLN: 25.0000 ug | INTRAMUSCULAR | Status: DC | PRN

## 2020-01-23 MED ORDER — CELECOXIB 200 MG PO CAPS
200.0000 mg | ORAL_CAPSULE | Freq: Two times a day (BID) | ORAL | Status: DC
  Administered 2020-01-23 – 2020-01-25 (×4): 200 mg via ORAL
  Filled 2020-01-23 (×4): qty 1

## 2020-01-23 MED ORDER — SODIUM CHLORIDE 0.9 % IV SOLN: INTRAVENOUS | Status: DC

## 2020-01-23 MED ORDER — FLEET ENEMA 7-19 GM/118ML RE ENEM: 1.0000 | ENEMA | Freq: Once | RECTAL | Status: DC | PRN

## 2020-01-23 MED ORDER — ACETAMINOPHEN 10 MG/ML IV SOLN
1000.0000 mg | Freq: Four times a day (QID) | INTRAVENOUS | Status: AC
  Administered 2020-01-23 – 2020-01-24 (×4): 1000 mg via INTRAVENOUS
  Filled 2020-01-23 (×4): qty 100

## 2020-01-23 MED ORDER — METOCLOPRAMIDE HCL 5 MG/ML IJ SOLN: 5.0000 mg | Freq: Three times a day (TID) | INTRAMUSCULAR | Status: DC | PRN

## 2020-01-23 MED ORDER — EPHEDRINE SULFATE 50 MG/ML IJ SOLN
INTRAMUSCULAR | Status: DC | PRN
  Administered 2020-01-23 (×3): 10 mg via INTRAVENOUS

## 2020-01-23 MED ORDER — OXYCODONE HCL 5 MG PO TABS: 5.0000 mg | ORAL_TABLET | Freq: Once | ORAL | Status: DC | PRN

## 2020-01-23 MED ORDER — SENNOSIDES-DOCUSATE SODIUM 8.6-50 MG PO TABS
1.0000 | ORAL_TABLET | Freq: Two times a day (BID) | ORAL | Status: DC
  Administered 2020-01-23 – 2020-01-25 (×4): 1 via ORAL
  Filled 2020-01-23 (×4): qty 1

## 2020-01-23 MED ORDER — NEOMYCIN-POLYMYXIN B GU 40-200000 IR SOLN
Status: DC | PRN
  Administered 2020-01-23: 16 mL

## 2020-01-23 MED ORDER — ONDANSETRON HCL 4 MG PO TABS: 4.0000 mg | ORAL_TABLET | Freq: Four times a day (QID) | ORAL | Status: DC | PRN

## 2020-01-23 MED ORDER — HYDROCHLOROTHIAZIDE 12.5 MG PO CAPS
12.5000 mg | ORAL_CAPSULE | Freq: Every day | ORAL | Status: DC
  Administered 2020-01-25: 12.5 mg via ORAL
  Filled 2020-01-23: qty 1

## 2020-01-23 MED ORDER — INOSITOL NIACINATE 500 MG PO CAPS: 1000.0000 mg | ORAL_CAPSULE | Freq: Two times a day (BID) | ORAL | Status: DC

## 2020-01-23 MED ORDER — DEXMEDETOMIDINE HCL 200 MCG/2ML IV SOLN
INTRAVENOUS | Status: DC | PRN
  Administered 2020-01-23: 4 ug via INTRAVENOUS
  Administered 2020-01-23: 20 ug via INTRAVENOUS
  Administered 2020-01-23 (×2): 8 ug via INTRAVENOUS

## 2020-01-23 MED ORDER — ACETAMINOPHEN 10 MG/ML IV SOLN
INTRAVENOUS | Status: DC | PRN
  Administered 2020-01-23: 1000 mg via INTRAVENOUS

## 2020-01-23 MED ORDER — DEXAMETHASONE SODIUM PHOSPHATE 10 MG/ML IJ SOLN
INTRAMUSCULAR | Status: AC
  Administered 2020-01-23: 8 mg via INTRAVENOUS
  Filled 2020-01-23: qty 1

## 2020-01-23 MED ORDER — ENOXAPARIN SODIUM 30 MG/0.3ML ~~LOC~~ SOLN
30.0000 mg | Freq: Two times a day (BID) | SUBCUTANEOUS | Status: DC
  Administered 2020-01-24 – 2020-01-25 (×3): 30 mg via SUBCUTANEOUS
  Filled 2020-01-23 (×3): qty 0.3

## 2020-01-23 MED ORDER — PANTOPRAZOLE SODIUM 40 MG PO TBEC
40.0000 mg | DELAYED_RELEASE_TABLET | Freq: Two times a day (BID) | ORAL | Status: DC
  Administered 2020-01-23 – 2020-01-25 (×4): 40 mg via ORAL
  Filled 2020-01-23 (×4): qty 1

## 2020-01-23 MED ORDER — LISINOPRIL 20 MG PO TABS
20.0000 mg | ORAL_TABLET | Freq: Every day | ORAL | Status: DC
  Administered 2020-01-24 – 2020-01-25 (×2): 20 mg via ORAL
  Filled 2020-01-23 (×2): qty 1

## 2020-01-23 MED ORDER — GABAPENTIN 300 MG PO CAPS
300.0000 mg | ORAL_CAPSULE | Freq: Every day | ORAL | Status: DC
  Administered 2020-01-23 – 2020-01-24 (×2): 300 mg via ORAL
  Filled 2020-01-23 (×2): qty 1

## 2020-01-23 MED ORDER — HYDROMORPHONE HCL 1 MG/ML IJ SOLN: 0.5000 mg | INTRAMUSCULAR | Status: DC | PRN

## 2020-01-23 MED ORDER — DEXAMETHASONE SODIUM PHOSPHATE 10 MG/ML IJ SOLN: 8.0000 mg | Freq: Once | INTRAMUSCULAR | Status: AC

## 2020-01-23 MED ORDER — PROPOFOL 10 MG/ML IV BOLUS
INTRAVENOUS | Status: DC | PRN
  Administered 2020-01-23: 10 mg via INTRAVENOUS
  Administered 2020-01-23: 60 mg via INTRAVENOUS

## 2020-01-23 MED ORDER — GABAPENTIN 300 MG PO CAPS
ORAL_CAPSULE | ORAL | Status: AC
  Administered 2020-01-23: 10:00:00 300 mg via ORAL
  Filled 2020-01-23: qty 1

## 2020-01-23 MED ORDER — SODIUM CHLORIDE 0.9 % IV SOLN
INTRAVENOUS | Status: DC | PRN
  Administered 2020-01-23: 60 mL

## 2020-01-23 MED ORDER — CLINDAMYCIN PHOSPHATE 900 MG/50ML IV SOLN
900.0000 mg | INTRAVENOUS | Status: AC
  Administered 2020-01-23: 900 mg via INTRAVENOUS

## 2020-01-23 MED ORDER — SIMVASTATIN 20 MG PO TABS
40.0000 mg | ORAL_TABLET | Freq: Every day | ORAL | Status: DC
  Administered 2020-01-23 – 2020-01-24 (×2): 40 mg via ORAL
  Filled 2020-01-23 (×2): qty 2

## 2020-01-23 MED ORDER — CELECOXIB 200 MG PO CAPS: 400.0000 mg | ORAL_CAPSULE | Freq: Once | ORAL | Status: AC

## 2020-01-23 MED ORDER — VITAMIN B-12 1000 MCG PO TABS
1000.0000 ug | ORAL_TABLET | Freq: Every day | ORAL | Status: DC
  Administered 2020-01-24 – 2020-01-25 (×2): 1000 ug via ORAL
  Filled 2020-01-23 (×2): qty 1

## 2020-01-23 MED ORDER — BUPIVACAINE HCL (PF) 0.5 % IJ SOLN
INTRAMUSCULAR | Status: AC
  Filled 2020-01-23: qty 10

## 2020-01-23 MED ORDER — LISINOPRIL-HYDROCHLOROTHIAZIDE 20-12.5 MG PO TABS: 1.0000 | ORAL_TABLET | Freq: Every morning | ORAL | Status: DC

## 2020-01-23 MED ORDER — NEOMYCIN-POLYMYXIN B GU 40-200000 IR SOLN
Status: AC
  Filled 2020-01-23: qty 20

## 2020-01-23 MED ORDER — PHENOL 1.4 % MT LIQD
1.0000 | OROMUCOSAL | Status: DC | PRN
  Filled 2020-01-23: qty 177

## 2020-01-23 MED ORDER — ENSURE PRE-SURGERY PO LIQD
296.0000 mL | Freq: Once | ORAL | Status: AC
  Administered 2020-01-23: 08:00:00 296 mL via ORAL
  Filled 2020-01-23: qty 296

## 2020-01-23 MED ORDER — SODIUM CHLORIDE 0.9 % IV SOLN
INTRAVENOUS | Status: DC | PRN
  Administered 2020-01-23: 5 ug/min via INTRAVENOUS

## 2020-01-23 MED ORDER — DEXMEDETOMIDINE HCL IN NACL 200 MCG/50ML IV SOLN
INTRAVENOUS | Status: AC
  Filled 2020-01-23: qty 50

## 2020-01-23 MED ORDER — MAGNESIUM HYDROXIDE 400 MG/5ML PO SUSP
30.0000 mL | Freq: Every day | ORAL | Status: DC
  Administered 2020-01-24: 30 mL via ORAL
  Filled 2020-01-23 (×2): qty 30

## 2020-01-23 MED ORDER — VITAMIN E 180 MG (400 UNIT) PO CAPS
400.0000 [IU] | ORAL_CAPSULE | Freq: Every day | ORAL | Status: DC
  Administered 2020-01-24 – 2020-01-25 (×2): 400 [IU] via ORAL
  Filled 2020-01-23 (×2): qty 1

## 2020-01-23 MED ORDER — OXYCODONE HCL 5 MG PO TABS: 10.0000 mg | ORAL_TABLET | ORAL | Status: DC | PRN

## 2020-01-23 MED ORDER — SODIUM CHLORIDE (PF) 0.9 % IJ SOLN
INTRAMUSCULAR | Status: AC
  Filled 2020-01-23: qty 10

## 2020-01-23 MED ORDER — TRAMADOL HCL 50 MG PO TABS
50.0000 mg | ORAL_TABLET | ORAL | Status: DC | PRN
  Administered 2020-01-24 – 2020-01-25 (×4): 100 mg via ORAL
  Filled 2020-01-23 (×4): qty 2

## 2020-01-23 MED ORDER — MENTHOL 3 MG MT LOZG
1.0000 | LOZENGE | OROMUCOSAL | Status: DC | PRN
  Administered 2020-01-24 (×2): 3 mg via ORAL
  Filled 2020-01-23 (×2): qty 9

## 2020-01-23 MED ORDER — METOCLOPRAMIDE HCL 10 MG PO TABS: 5.0000 mg | ORAL_TABLET | Freq: Three times a day (TID) | ORAL | Status: DC | PRN

## 2020-01-23 MED ORDER — ALUM & MAG HYDROXIDE-SIMETH 200-200-20 MG/5ML PO SUSP: 30.0000 mL | ORAL | Status: DC | PRN

## 2020-01-23 MED ORDER — OXYCODONE HCL 5 MG PO TABS
5.0000 mg | ORAL_TABLET | ORAL | Status: DC | PRN
  Administered 2020-01-23: 20:00:00 5 mg via ORAL
  Filled 2020-01-23: qty 1

## 2020-01-23 MED ORDER — BUPIVACAINE HCL (PF) 0.5 % IJ SOLN
INTRAMUSCULAR | Status: DC | PRN
  Administered 2020-01-23: 3 mL

## 2020-01-23 MED ORDER — LACTATED RINGERS IV SOLN: INTRAVENOUS | Status: DC

## 2020-01-23 SURGICAL SUPPLY — 77 items
ATTUNE MED DOME PAT 41 KNEE (Knees) ×1 IMPLANT
ATTUNE MED DOME PAT 41MM KNEE (Knees) ×1 IMPLANT
ATTUNE PSRP INSR SZ6 5 KNEE (Insert) ×1 IMPLANT
ATTUNE PSRP INSR SZ6 5MM KNEE (Insert) ×1 IMPLANT
BASE TIBIAL ROT PLAT SZ 7 KNEE (Knees) IMPLANT
BATTERY INSTRU NAVIGATION (MISCELLANEOUS) ×12 IMPLANT
BLADE SAW 70X12.5 (BLADE) ×3 IMPLANT
BLADE SAW 90X13X1.19 OSCILLAT (BLADE) ×3 IMPLANT
BLADE SAW 90X25X1.19 OSCILLAT (BLADE) ×3 IMPLANT
CANISTER SUCT 3000ML PPV (MISCELLANEOUS) ×3 IMPLANT
CEMENT HV SMART SET (Cement) ×2 IMPLANT
COOLER POLAR GLACIER W/PUMP (MISCELLANEOUS) ×3 IMPLANT
COVER WAND RF STERILE (DRAPES) ×3 IMPLANT
CUFF TOURN SGL QUICK 30 (TOURNIQUET CUFF) ×2
CUFF TRNQT CYL 30X4X21-28X (TOURNIQUET CUFF) IMPLANT
DRAPE 3/4 80X56 (DRAPES) ×3 IMPLANT
DRSG DERMACEA 8X12 NADH (GAUZE/BANDAGES/DRESSINGS) ×3 IMPLANT
DRSG OPSITE POSTOP 4X14 (GAUZE/BANDAGES/DRESSINGS) ×3 IMPLANT
DRSG TEGADERM 4X4.75 (GAUZE/BANDAGES/DRESSINGS) ×3 IMPLANT
DURAPREP 26ML APPLICATOR (WOUND CARE) ×6 IMPLANT
ELECT REM PT RETURN 9FT ADLT (ELECTROSURGICAL) ×3
ELECTRODE REM PT RTRN 9FT ADLT (ELECTROSURGICAL) ×1 IMPLANT
EX-PIN ORTHOLOCK NAV 4X150 (PIN) ×6 IMPLANT
GAUZE XEROFORM 1X8 LF (GAUZE/BANDAGES/DRESSINGS) ×2 IMPLANT
GLOVE BIO SURGEON STRL SZ7.5 (GLOVE) ×6 IMPLANT
GLOVE BIOGEL M STRL SZ7.5 (GLOVE) ×6 IMPLANT
GLOVE BIOGEL PI IND STRL 7.5 (GLOVE) ×1 IMPLANT
GLOVE BIOGEL PI INDICATOR 7.5 (GLOVE) ×2
GLOVE INDICATOR 8.0 STRL GRN (GLOVE) ×3 IMPLANT
GOWN STRL REUS W/ TWL LRG LVL3 (GOWN DISPOSABLE) ×2 IMPLANT
GOWN STRL REUS W/ TWL XL LVL3 (GOWN DISPOSABLE) ×1 IMPLANT
GOWN STRL REUS W/TWL LRG LVL3 (GOWN DISPOSABLE) ×4
GOWN STRL REUS W/TWL XL LVL3 (GOWN DISPOSABLE) ×2
HEMOVAC 400CC 10FR (MISCELLANEOUS) ×3 IMPLANT
HOLDER FOLEY CATH W/STRAP (MISCELLANEOUS) ×3 IMPLANT
HOOD PEEL AWAY FLYTE STAYCOOL (MISCELLANEOUS) ×6 IMPLANT
KIT TURNOVER KIT A (KITS) ×3 IMPLANT
KNIFE SCULPS 14X20 (INSTRUMENTS) ×3 IMPLANT
LABEL OR SOLS (LABEL) ×3 IMPLANT
MANIFOLD NEPTUNE II (INSTRUMENTS) ×3 IMPLANT
NDL SAFETY ECLIPSE 18X1.5 (NEEDLE) ×1 IMPLANT
NDL SPNL 20GX3.5 QUINCKE YW (NEEDLE) ×2 IMPLANT
NEEDLE HYPO 18GX1.5 SHARP (NEEDLE) ×2
NEEDLE SPNL 20GX3.5 QUINCKE YW (NEEDLE) ×6 IMPLANT
NS IRRIG 500ML POUR BTL (IV SOLUTION) ×3 IMPLANT
PACK TOTAL KNEE (MISCELLANEOUS) ×3 IMPLANT
PAD ABD DERMACEA PRESS 5X9 (GAUZE/BANDAGES/DRESSINGS) ×2 IMPLANT
PAD CAST CTTN 4X4 STRL (SOFTGOODS) IMPLANT
PAD WRAPON POLAR KNEE (MISCELLANEOUS) ×1 IMPLANT
PADDING CAST COTTON 4X4 STRL (SOFTGOODS) ×2
PENCIL SMOKE EVACUATOR COATED (MISCELLANEOUS) ×3 IMPLANT
PENCIL SMOKE ULTRAEVAC 22 CON (MISCELLANEOUS) ×3 IMPLANT
PIN DRILL QUICK PACK ×5 IMPLANT
PIN FIXATION 1/8DIA X 3INL (PIN) ×9 IMPLANT
PULSAVAC PLUS IRRIG FAN TIP (DISPOSABLE) ×3
SOL .9 NS 3000ML IRR  AL (IV SOLUTION) ×2
SOL .9 NS 3000ML IRR UROMATIC (IV SOLUTION) ×1 IMPLANT
SOL PREP PVP 2OZ (MISCELLANEOUS) ×3
SOLUTION PREP PVP 2OZ (MISCELLANEOUS) ×1 IMPLANT
SPONGE DRAIN TRACH 4X4 STRL 2S (GAUZE/BANDAGES/DRESSINGS) ×3 IMPLANT
STAPLER SKIN PROX 35W (STAPLE) ×3 IMPLANT
STOCKINETTE BIAS CUT 6 980064 (GAUZE/BANDAGES/DRESSINGS) ×2 IMPLANT
STOCKINETTE IMPERV 14X48 (MISCELLANEOUS) IMPLANT
STRAP TIBIA SHORT (MISCELLANEOUS) ×3 IMPLANT
SUCTION FRAZIER HANDLE 10FR (MISCELLANEOUS) ×2
SUCTION TUBE FRAZIER 10FR DISP (MISCELLANEOUS) ×1 IMPLANT
SUT VIC AB 0 CT1 36 (SUTURE) ×6 IMPLANT
SUT VIC AB 1 CT1 36 (SUTURE) ×6 IMPLANT
SUT VIC AB 2-0 CT2 27 (SUTURE) ×3 IMPLANT
SYR 20ML LL LF (SYRINGE) ×3 IMPLANT
SYR 30ML LL (SYRINGE) ×6 IMPLANT
TIBIAL BASE ROT PLAT SZ 7 KNEE (Knees) ×3 IMPLANT
TIP FAN IRRIG PULSAVAC PLUS (DISPOSABLE) ×1 IMPLANT
TOWEL OR 17X26 4PK STRL BLUE (TOWEL DISPOSABLE) ×3 IMPLANT
TOWER CARTRIDGE SMART MIX (DISPOSABLE) ×3 IMPLANT
TRAY FOLEY MTR SLVR 16FR STAT (SET/KITS/TRAYS/PACK) ×3 IMPLANT
WRAPON POLAR PAD KNEE (MISCELLANEOUS) ×3

## 2020-01-23 NOTE — H&P (Signed)
The patient has been re-examined, and the chart reviewed, and there have been no interval changes to the documented history and physical.    The risks, benefits, and alternatives have been discussed at length. The patient expressed understanding of the risks benefits and agreed with plans for surgical intervention.  Dafne Nield P. Josede Cicero, Jr. M.D.    

## 2020-01-23 NOTE — Anesthesia Postprocedure Evaluation (Signed)
Anesthesia Post Note  Patient: [place] Spry Sr.  Procedure(s) Performed: COMPUTER ASSISTED TOTAL KNEE ARTHROPLASTY (Right Knee)  Patient location during evaluation: PACU Anesthesia Type: Spinal Level of consciousness: awake and alert Pain management: pain level controlled Vital Signs Assessment: post-procedure vital signs reviewed and stable Respiratory status: spontaneous breathing, nonlabored ventilation, respiratory function stable and patient connected to nasal cannula oxygen Cardiovascular status: blood pressure returned to baseline and stable Postop Assessment: no apparent nausea or vomiting and spinal receding Anesthetic complications: no     Last Vitals:  Vitals:   01/23/20 1723 01/23/20 1740  BP: 128/75 105/78  Pulse: 79 77  Resp: 17 18  Temp:  36.7 C  SpO2: 95% 96%    Last Pain:  Vitals:   01/23/20 1740  TempSrc: Oral  PainSc:                  Arita Miss

## 2020-01-23 NOTE — Op Note (Signed)
OPERATIVE NOTE  DATE OF SURGERY:  01/23/2020  PATIENT NAME:  Cody OPARA Sr.   DOB: [birth year]  MRN: [id number]  PRE-OPERATIVE DIAGNOSIS: Degenerative arthrosis of the right knee, primary  POST-OPERATIVE DIAGNOSIS:  Same  PROCEDURE:  Right total knee arthroplasty using computer-assisted navigation  SURGEON:  Marciano Sequin. M.D.  ASSISTANT: Cassell Smiles, PA-C (present and scrubbed throughout the case, critical for assistance with exposure, retraction, instrumentation, and closure)  ANESTHESIA: spinal  ESTIMATED BLOOD LOSS: 50 mL  FLUIDS REPLACED: 1300 mL of crystalloid  TOURNIQUET TIME: 98 minutes  DRAINS: 2 medium Hemovac drains  SOFT TISSUE RELEASES: Anterior cruciate ligament, posterior cruciate ligament, deep medial collateral ligament, patellofemoral ligament  IMPLANTS UTILIZED: DePuy Attune size 6 posterior stabilized femoral component (cemented), size 7 rotating platform tibial component (cemented), 41 mm medialized dome patella (cemented), and a 5 mm stabilized rotating platform polyethylene insert.  INDICATIONS FOR SURGERY: Cody SOUCIE Sr. is a 69 y.o. year old male with a long history of progressive knee pain. X-rays demonstrated severe degenerative changes in tricompartmental fashion. The patient had not seen any significant improvement despite conservative nonsurgical intervention. After discussion of the risks and benefits of surgical intervention, the patient expressed understanding of the risks benefits and agree with plans for total knee arthroplasty.   The risks, benefits, and alternatives were discussed at length including but not limited to the risks of infection, bleeding, nerve injury, stiffness, blood clots, the need for revision surgery, cardiopulmonary complications, among others, and they were willing to proceed.  PROCEDURE IN DETAIL: The patient was brought into the operating room and, after adequate spinal anesthesia was achieved, a tourniquet was  placed on the patient's upper thigh. The patient's knee and leg were cleaned and prepped with alcohol and DuraPrep and draped in the usual sterile fashion. A "timeout" was performed as per usual protocol. The lower extremity was exsanguinated using an Esmarch, and the tourniquet was inflated to 300 mmHg. An anterior longitudinal incision was made followed by a standard mid vastus approach. The deep fibers of the medial collateral ligament were elevated in a subperiosteal fashion off of the medial flare of the tibia so as to maintain a continuous soft tissue sleeve. The patella was subluxed laterally and the patellofemoral ligament was incised. Inspection of the knee demonstrated severe degenerative changes with full-thickness loss of articular cartilage. Osteophytes were debrided using a rongeur. Anterior and posterior cruciate ligaments were excised. Two 4.0 mm Schanz pins were inserted in the femur and into the tibia for attachment of the array of trackers used for computer-assisted navigation. Hip center was identified using a circumduction technique. Distal landmarks were mapped using the computer. The distal femur and proximal tibia were mapped using the computer. The distal femoral cutting guide was positioned using computer-assisted navigation so as to achieve a 5 distal valgus cut. The femur was sized and it was felt that a size 6 femoral component was appropriate. A size 6 femoral cutting guide was positioned and the anterior cut was performed and verified using the computer. This was followed by completion of the posterior and chamfer cuts. Femoral cutting guide for the central box was then positioned in the center box cut was performed.  Attention was then directed to the proximal tibia. Medial and lateral menisci were excised. The extramedullary tibial cutting guide was positioned using computer-assisted navigation so as to achieve a 0 varus-valgus alignment and 3 posterior slope. The cut was  performed and verified using the computer. The proximal  tibia was sized and it was felt that a size 7 tibial tray was appropriate. Tibial and femoral trials were inserted followed by insertion of a 5 mm polyethylene insert. This allowed for excellent mediolateral soft tissue balancing both in flexion and in full extension. Finally, the patella was cut and prepared so as to accommodate a 41 mm medialized dome patella. A patella trial was placed and the knee was placed through a range of motion with excellent patellar tracking appreciated. The femoral trial was removed after debridement of posterior osteophytes. The central post-hole for the tibial component was reamed followed by insertion of a keel punch. Tibial trials were then removed. Cut surfaces of bone were irrigated with copious amounts of normal saline with antibiotic solution using pulsatile lavage and then suctioned dry. Polymethylmethacrylate cement was prepared in the usual fashion using a vacuum mixer. Cement was applied to the cut surface of the proximal tibia as well as along the undersurface of a size 7 rotating platform tibial component. Tibial component was positioned and impacted into place. Excess cement was removed using [employer] fast streamer. Cement was then applied to the cut surfaces of the femur as well as along the posterior flanges of the size 6 femoral component. The femoral component was positioned and impacted into place. Excess cement was removed using [employer] fast streamer. A 5 mm polyethylene trial was inserted and the knee was brought into full extension with steady axial compression applied. Finally, cement was applied to the backside of a 41 mm medialized dome patella and the patellar component was positioned and patellar clamp applied. Excess cement was removed using [employer] fast streamer. After adequate curing of the cement, the tourniquet was deflated after a total tourniquet time of 98 minutes. Hemostasis was achieved using electrocautery. The  knee was irrigated with copious amounts of normal saline with antibiotic solution using pulsatile lavage and then suctioned dry. 20 mL of 1.3% Exparel and 60 mL of 0.25% Marcaine in 40 mL of normal saline was injected along the posterior capsule, medial and lateral gutters, and along the arthrotomy site. A 5 mm stabilized rotating platform polyethylene insert was inserted and the knee was placed through a range of motion with excellent mediolateral soft tissue balancing appreciated and excellent patellar tracking noted. 2 medium drains were placed in the wound bed and brought out through separate stab incisions. The medial parapatellar portion of the incision was reapproximated using interrupted sutures of #1 Vicryl. Subcutaneous tissue was approximated in layers using first #0 Vicryl followed #2-0 Vicryl. The skin was approximated with skin staples. A sterile dressing was applied.  The patient tolerated the procedure well and was transported to the recovery room in stable condition.    Tevyn Codd P. Holley Bouche., M.D.

## 2020-01-23 NOTE — Progress Notes (Signed)
Pt sat on side of bed about 10 mins tolerated well.

## 2020-01-23 NOTE — Anesthesia Preprocedure Evaluation (Signed)
Anesthesia Evaluation  Patient identified by MRN, date of birth, ID band Patient awake    Reviewed: Allergy & Precautions, H&P , NPO status , Patient's Chart, lab work & pertinent test results  History of Anesthesia Complications Negative for: history of anesthetic complications  Airway Mallampati: III  TM Distance: <3 FB Neck ROM: limited    Dental  (+) Chipped   Pulmonary neg pulmonary ROS, neg shortness of breath, former smoker,           Cardiovascular Exercise Tolerance: Good hypertension, (-) angina+ CAD, + Cardiac Stents and + Peripheral Vascular Disease  (-) DOE      Neuro/Psych negative neurological ROS  negative psych ROS   GI/Hepatic Neg liver ROS, GERD  Medicated and Controlled,  Endo/Other  diabetes, Type 2  Renal/GU      Musculoskeletal  (+) Arthritis ,   Abdominal   Peds  Hematology negative hematology ROS (+)   Anesthesia Other Findings Past Medical History: No date: Arthritis     Comment:  lower back  No date: Cancer ([place])     Comment:  prostate cancer  No date: Coronary artery disease     Comment:  stent RCA- 2000 and 12/2009  No date: GERD (gastroesophageal reflux disease) No date: Hyperlipidemia No date: Hypertension No date: Skin cancer  Past Surgical History: No date: APPENDECTOMY 07/18/2017: COLONOSCOPY WITH PROPOFOL; N/A     Comment:  Procedure: COLONOSCOPY WITH PROPOFOL;  Surgeon:               [name], MD;  Location: [facility];                Service: Endoscopy;  Laterality: N/A; No date: CORONARY ANGIOPLASTY No date: coronary stents      Comment:  x 2 07/20/2017: KNEE ARTHROPLASTY; Left     Comment:  Procedure: COMPUTER ASSISTED TOTAL KNEE ARTHROPLASTY;                Surgeon: [name], MD;  Location: [facility] ORS;                Service: Orthopedics;  Laterality: Left; 07/16/2013: ROBOT ASSISTED LAPAROSCOPIC RADICAL PROSTATECTOMY; N/A     Comment:   Procedure: ROBOTIC ASSISTED LAPAROSCOPIC RADICAL               PROSTATECTOMY LEVEL 2, bilateral pelvic lymphadenectomy;               Surgeon: [name], MD;  Location: [facility] ORS;  Service:               Urology;  Laterality: N/A; No date: TONSILLECTOMY     Comment:  and adenoidectomy      Reproductive/Obstetrics negative OB ROS                             Anesthesia Physical Anesthesia Plan  ASA: III  Anesthesia Plan: Spinal   Post-op Pain Management:    Induction:   PONV Risk Score and Plan:   Airway Management Planned: Natural Airway and Nasal Cannula  Additional Equipment:   Intra-op Plan:   Post-operative Plan:   Informed Consent: I have reviewed the patients History and Physical, chart, labs and discussed the procedure including the risks, benefits and alternatives for the proposed anesthesia with the patient or authorized representative who has indicated his/her understanding and acceptance.     Dental Advisory Given  Plan Discussed with: Anesthesiologist, CRNA and  Surgeon  Anesthesia Plan Comments: (Patient reports no bleeding problems and no anticoagulant use.  Plan for spinal with backup GA  Patient consented for risks of anesthesia including but not limited to:  - adverse reactions to medications - risk of bleeding, infection, nerve damage and headache - risk of failed spinal - damage to teeth, lips or other oral mucosa - sore throat or hoarseness - Damage to heart, brain, lungs or loss of life  Patient voiced understanding.)        Anesthesia Quick Evaluation

## 2020-01-23 NOTE — Transfer of Care (Signed)
Immediate Anesthesia Transfer of Care Note  Patient: [place] Spry Sr.  Procedure(s) Performed: COMPUTER ASSISTED TOTAL KNEE ARTHROPLASTY (Right Knee)  Patient Location: PACU  Anesthesia Type:General  Level of Consciousness: drowsy  Airway & Oxygen Therapy: Patient Spontanous Breathing and Patient connected to face mask oxygen  Post-op Assessment: Report given to RN and Post -op Vital signs reviewed and stable  Post vital signs: Reviewed and stable  Last Vitals:  Vitals Value Taken Time  BP 115/72 01/23/20 1617  Temp 36.8 C 01/23/20 1620  Pulse 86 01/23/20 1623  Resp 18 01/23/20 1623  SpO2 96 % 01/23/20 1623  Vitals shown include unvalidated device data.  Last Pain:  Vitals:   01/23/20 0956  TempSrc: Temporal         Complications: No apparent anesthesia complications

## 2020-01-23 NOTE — Anesthesia Procedure Notes (Signed)
Spinal  Patient location during procedure: OR Start time: 01/23/2020 12:15 PM End time: 01/23/2020 12:21 PM Staffing Performed: anesthesiologist and resident/CRNA  Anesthesiologist: Piscitello, Precious Haws, MD Resident/CRNA: Lia Foyer, CRNA Preanesthetic Checklist Completed: patient identified, IV checked, site marked, risks and benefits discussed, surgical consent, monitors and equipment checked, pre-op evaluation and timeout performed Spinal Block Patient position: sitting Prep: DuraPrep Patient monitoring: heart rate, cardiac monitor, continuous pulse ox and blood pressure Approach: midline Location: L3-4 Injection technique: single-shot Needle Needle type: Sprotte  Needle gauge: 25 G Needle length: 9 cm Assessment Sensory level: T4 Additional Notes Attempt x 1 by CRNA with CSF return, however; poor flow and unable to aspirate, attempt x 1 by MD with CSF aspiration

## 2020-01-23 NOTE — H&P (Signed)
ORTHOPAEDIC HISTORY & PHYSICAL Progress Notes by Gwenlyn Fudge, PA at 01/15/2020 2:15 PM  [facility] Chief Complaint:       Chief Complaint  Patient presents with  . Pre-op Exam    Right total knee 3.24.21    History of Present Illness:    Cody Ellison is a 69 y.o. male that presents to clinic today for his preoperative history and evaluation.  Patient presents with his wife. The patient is scheduled to undergo a right total knee arthroplasty on 01/23/20 by Dr. Marry Guan. His pain began several years ago.  The pain is located primarily over the medial aspect of the knee.  He describes his pain as worse with weightbearing.    He denies associated numbness or tingling, giving way, or locking of the knee.   The patient's symptoms have progressed to the point that they decrease his quality of life. The patient has previously undergone conservative treatment including NSAIDS and injections to the knee without adequate control of his symptoms.  Patient denies history of blood clots. Patient sees Dr. Saralyn Pilar for cardiology. No history of back surgeries. Patient does take Lupron for prostate cancer. His last shot was approximately 5 months ago.   Of note, patient's wife states that when our patient had his other knee replaced there was an issue at the pharmacy.  The pharmacist refused to fill the prescription of both tramadol and oxycodone at the same time.  She requests the prescription of tramadol today.  Past Medical, Surgical, Family, Social History, Allergies, Medications:   Past Medical History:      Past Medical History:  Diagnosis Date  . CAD (coronary artery disease)   . Carotid bruit    BILATERAL  . HTN (hypertension) 03/15/2014  . Hyperlipidemia   . Hyperplastic colon polyp 07/18/2017  . Post PTCA    LEFT CIRCUMFLEX  . Prostate cancer (CMS-HCC)     Past Surgical History:       Past Surgical History:   Procedure Laterality Date  . APPENDECTOMY    . CARDIAC CATHETERIZATION    . COLONOSCOPY  12/06/2006  . COLONOSCOPY  07/18/2017   Hyperplastic colon polyp/Repeat 12yrs/MUS  . Left total knee arthroplasty using computer-assisted navigation  07/20/2017   Dr Marry Guan  . PROSTATE SURGERY    . TONSILLECTOMY      Current Medications:  Current Medications        Current Outpatient Medications  Medication Sig Dispense Refill  . aspirin 81 MG EC tablet Take 81 mg by mouth once daily.    [name] atenoloL (TENORMIN) 100 MG tablet TAKE 1 TABLET BY MOUTH EVERY DAY 90 tablet 1  . calcium carbonate-vitamin D3 (CALTRATE 600+D) 600 mg(1,500mg ) -200 unit tablet Take 1 tablet by mouth once daily.    [name] co-enzyme Q-10, ubiquinone, (CO Q-10) 100 mg capsule Take 100 mg by mouth once daily.    . cyanocobalamin, vitamin B-12, (VITAMIN B12 ORAL) Take by mouth gummie 3000 mcg a day    . diphenhydram-PE-acetaminoph-GG (MUCINEX SINUS-MAX D-N, DIPHEN,) 5-325-200mg (d)/ 25-5mg -325mg (n) TbSQ Take 1 tablet by mouth 2 (two) times daily as needed.    . felodipine (PLENDIL) 10 MG ER tablet TAKE 1 TABLET BY MOUTH EVERY DAY 90 tablet 1  . ferrous fumarate/vit Bcomp,C (SUPER B COMPLEX ORAL) Take 1 tablet by mouth once daily.    . fluticasone propionate (FLONASE) 50 mcg/actuation nasal spray SPRAY 2 SPRAYS INTO EACH NOSTRIL EVERY DAY 16 g 3  .  folic acid (FOLVITE) A999333 MCG tablet Take 800 mcg by mouth once daily.      . hydroCHLOROthiazide (HYDRODIURIL) 25 MG tablet Take 1 tablet (25 mg total) by mouth once daily as needed 90 tablet 3  . lisinopriL-hydrochlorothiazide (ZESTORETIC) 20-12.5 mg tablet TAKE 1 TABLET BY MOUTH EVERY DAY 90 tablet 1  . naproxen sodium (ALEVE, ANAPROX) 220 MG tablet Take 220 mg by mouth 2 (two) times daily with meals    . niacin (NIASPAN) 1000 MG ER tablet Take 1,000 mg by mouth 2 (two) times daily.      [name] omeprazole (PRILOSEC) 20 MG DR capsule TAKE 1 CAPSULE BY MOUTH EVERY  DAY 90 capsule 1  . simvastatin (ZOCOR) 40 MG tablet TAKE ONE TABLET BY MOUTH EACH NIGHT 90 tablet 1  . vitamin E 400 UNIT capsule Take 400 Units by mouth once daily.     No current facility-administered medications for this visit.       Allergies:       Allergies  Allergen Reactions  . Ampicillin Other (See Comments)    Raw mouth, throat and through whole digestive tract   . Cardizem [Diltiazem Hcl] Hives  . Iodinated Contrast Media Hives  . Sulfa (Sulfonamide Antibiotics) Hives  . Carvedilol Dizziness  . Lescol [Fluvastatin] Other (See Comments)    Affected liver enzynes  . Lipitor [Atorvastatin] Muscle Pain  . Lovastatin Muscle Pain  . Povidone-Iodine Rash    Social History:  Social History  Social History        Socioeconomic History  . Marital status: Married    Spouse name: Harmon Pier  . Number of children: 3  . Years of education: 10  . Highest education level: Not on file  Occupational History  . Occupation: Self employed Visual merchandiser  Social Needs  . Financial resource strain: Not on file  . Food insecurity    Worry: Not on file    Inability: Not on file  . Transportation needs    Medical: Not on file    Non-medical: Not on file  Tobacco Use  . Smoking status: Former Smoker    Packs/day: 1.00    Years: 30.00    Pack years: 30.00    Types: Cigarettes    Quit date: 1994    Years since quitting: 27.2  . Smokeless tobacco: Never Used  Substance and Sexual Activity  . Alcohol use: No    Alcohol/week: 0.0 standard drinks  . Drug use: No  . Sexual activity: Yes    Partners: Female  Lifestyle  . Physical activity    Days per week: Not on file    Minutes per session: Not on file  . Stress: Not on file  Relationships  . Social Herbalist on phone: Not on file    Gets together: Not on file    Attends religious service: Not on file    Active member of club or organization: Not on file    Attends  meetings of clubs or organizations: Not on file    Relationship status: Not on file  Other Topics Concern  . Not on file  Social History Narrative  . Not on file      Family History:       Family History  Problem Relation Age of Onset  . Ovarian cancer Mother   . Heart failure Father     Review of Systems:   A 10+ ROS was performed, reviewed, and the pertinent orthopaedic findings are documented  in the HPI.    Physical Examination:   There were no vitals taken for this visit.  Patient is a well-developed, well-nourished male in no acute distress. Patient has normal mood and affect. Patient is alert and oriented to person, place, and time.   HEENT: Atraumatic, normocephalic.  Pupils equal and reactive to light.  Extraocular motion intact.  Noninjected sclera.  Cardiovascular: Regular rate and rhythm, with no murmurs, rubs, or gallops.  Distal pulses palpable.  Respiratory: Lungs clear to auscultation bilaterally.   RightKnee: Soft tissue swelling:none Effusion:none Erythema:none Crepitance:none Tenderness:medial joint line Alignment:relative varus Mediolateral laxity:medial pseudolaxity Anterior drawer test:negative Lachman`s test:negative McMurray`s test:negative Atrophy:No significantatrophy.  Quadriceps tone was good. Range of Motion:0/0/124degrees    Sensation intact over the saphenous, lateral sural cutaneous, superficial fibular, and deep fibular nerve distributions.  Tests Performed/Reviewed:  X-rays  Anteroposterior, lateral, and sunrise views of the right knee were obtained.  Images reveal severe loss of medial compartment joint space with associated subchondral sclerosis of the bone and mild osteophyte formation.  Lateral compartment  remains relatively preserved with osteophyte formation.  Patellofemoral compartment reveals mild loss of joint space without osteophyte formation.  No fractures or dislocations noted.  I personally ordered and interpreted the above radiographs.  Impression:     ICD-10-CM  1. Primary osteoarthritis of right knee  M17.11   Plan:   The patient has end-stage degenerative changes of the right knee.  It was explained to the patient that the condition is progressive in nature.  Having failed conservative treatment, the patient has elected to proceed with a total joint arthroplasty.  The patient will undergo a total joint arthroplasty with Dr. Marry Guan.  The risks of surgery, including blood clot and infection, were discussed with the patient.  Measures to reduce these risks, including the use of anticoagulation, perioperative antibiotics, and early ambulation were discussed.  The importance of postoperative physical therapy was discussed with the patient. The patient elects to proceed with surgery. The patient is instructed to stop all blood thinners prior to surgery.  The patient is instructed to call the hospital the day before surgery to learn of the proper arrival time.  I stated that the patient should be able to fill both prescriptions without any issue.  If the patient has any difficulty filling the prescriptions I will call the pharmacy myself.  Contact our office with any questions or concerns.  Follow up as indicated, or sooner should any new problems arise, if conditions worsen, or if they are otherwise concerned.   Gwenlyn Fudge, PA [facility] [place], [place] [address] Phone: [phone]  This note was generated in part with voice recognition software and I apologize for any typographical errors that were not detected and corrected.     Electronically signed by Gwenlyn Fudge, [place] on 01/15/2020 7:56 PM

## 2020-01-24 NOTE — TOC Benefit Eligibility Note (Signed)
Transition of Care [facility]) Benefit Eligibility Note    Patient Details  Name: Cody SHIRAISHI Sr. MRN: [id number] Date of Birth: [birth year]   Medication/Dose: ENOXAPARIN   30 MG DAILY  X 14 DAYS  SYRINGES  Covered?: Yes  Tier: (TIER- 4 DRUG)  Prescription Coverage [facility]  Spoke with Person/Company/Phone Number:: JAZ   @ [facility] RX #  [phone] OPT-2  Co-Pay: $ 41.20  Prior Approval: No  Deductible: Unmet(AND  OUT-O-POCKET : NOT MET)  Additional Notes: LOVENOX  30 MG DAILY X 14 DAYS  SYRINGES , NOT COVER/NON-FORMULARY, P/A -YES # [phone] OPT- 2    Memory [place] Phone Number: 01/24/2020, 5:26 PM

## 2020-01-24 NOTE — Progress Notes (Signed)
  Subjective: 1 Day Post-Op Procedure(s) (LRB): COMPUTER ASSISTED TOTAL KNEE ARTHROPLASTY (Right) Patient reports pain as controlled with Tylenol.   Patient is well, and has had no acute complaints or problems Plan is to go Home after hospital stay. Negative for chest pain and shortness of breath Fever: no Gastrointestinal: negative for nausea and vomiting.  Patient has not had a bowel movement.  Objective: Vital signs in last 24 hours: Temp:  [97.5 F (36.4 C)-98.6 F (37 C)] 97.8 F (36.6 C) (03/24 2322) Pulse Rate:  [69-82] 69 (03/25 0413) Resp:  [14-20] 14 (03/25 0413) BP: (97-171)/(65-94) 97/65 (03/25 0413) SpO2:  [92 %-99 %] 98 % (03/25 0413) Weight:  [99.3 kg] 99.3 kg (03/24 1917)  Intake/Output from previous day:  Intake/Output Summary (Last 24 hours) at 01/24/2020 0758 Last data filed at 01/24/2020 0756 Gross per 24 hour  Intake 1453.54 ml  Output 1435 ml  Net 18.54 ml    Intake/Output this shift: Total I/O In: -  Out: 250 [Urine:250]  Labs: No results for input(s): HGB in the last 72 hours. No results for input(s): WBC, RBC, HCT, PLT in the last 72 hours. No results for input(s): NA, K, CL, CO2, BUN, CREATININE, GLUCOSE, CALCIUM in the last 72 hours. No results for input(s): LABPT, INR in the last 72 hours.   EXAM General - Patient is Alert, Appropriate and Oriented Extremity - Neurovascular intact Dorsiflexion/Plantar flexion intact Compartment soft Dressing/Incision -Postoperative dressing remains in place., Polar Care in place and working. , Hemovac in place. Foot in Bone Foam. Motor Function - intact, moving foot and toes well on exam. Able to perform SLR. Cardiovascular- Regular rate and rhythm, no murmurs/rubs/gallops Respiratory- Lungs clear to auscultation bilaterally Gastrointestinal- soft, nontender and active bowel sounds   Assessment/Plan: 1 Day Post-Op Procedure(s) (LRB): COMPUTER ASSISTED TOTAL KNEE ARTHROPLASTY (Right) Active  Problems:   Total knee replacement status  Estimated body mass index is 32.34 kg/m as calculated from the following:   Height as of this encounter: 5\' 9"  (1.753 m).   Weight as of this encounter: 99.3 kg. Advance diet Up with therapy Plan for discharge tomorrow    DVT Prophylaxis - Lovenox, Ted hose and foot pumps Weight-Bearing as tolerated to right leg  Cassell Smiles, PA-C [facility] 01/24/2020, 7:58 AM

## 2020-01-24 NOTE — Progress Notes (Signed)
Physical Therapy Treatment Patient Details Name: Cody Ellison. MRN: [id number] DOB: 1950/12/06 Today's Date: 01/24/2020    History of Present Illness Cody Ellison is a 33yoM who comes to [facility] on 01/23/20 for elective RtTKA. Pt reports Left TKA 3YA c excellent outcome. Pt still works part-time in Herbalist, although semiretired.    PT Comments    Pt in chair upon arrival, wife now in room. Pt reports feeling good, had lunch, still reports no pain. Continued with HEP education, shown self assist with gait belt when needed, charged with independent performance this evening as able. Stairs deferred until next session, pt has 1 to enter and a full flight to 2nd floor bedroom, but can sleep in 1st floor recliner if needed. AMB progressed to 236ft 2-point step through gait. Pt steady and balanced for multiple bimanual functional tasks in standing during session. Pt progressing quite nicely.     Follow Up Recommendations  Follow surgeon's recommendation for DC plan and follow-up therapies;Supervision - Intermittent     Equipment Recommendations  Rolling walker with 5" wheels;3in1 (PT)    Recommendations for Other Services       Precautions / Restrictions Precautions Precautions: Fall Restrictions Weight Bearing Restrictions: Yes RLE Weight Bearing: Weight bearing as tolerated    Mobility  Bed Mobility Overal bed mobility: Modified Independent                Transfers Overall transfer level: Modified independent Equipment used: Rolling walker (2 wheeled)             General transfer comment: no cues needed, pt fluent with RW use;  Ambulation/Gait Ambulation/Gait assistance: Min guard Gait Distance (Feet): 240 Feet Assistive device: Rolling walker (2 wheeled) Gait Pattern/deviations: WFL(Within Functional Limits) Gait velocity: 0.62m/s   General Gait Details: able to progres to a 2-point step-through gait with cuing, effort required reported as  minimal   Stairs Stairs: (deferred until next day)           Wheelchair Mobility    Modified Rankin (Stroke Patients Only)       Balance Overall balance assessment: Modified Independent(can don mask and wash hands without support or LOB)                                          Cognition Arousal/Alertness: Awake/alert Behavior During Therapy: WFL for tasks assessed/performed Overall Cognitive Status: Within Functional Limits for tasks assessed                                        Exercises Total Joint Exercises Ankle Circles/Pumps: AROM;Both;15 reps;Supine Quad Sets: AROM;Right;10 reps;Supine Gluteal Sets: AROM;Right;10 reps;Supine Short Arc Quad: AROM;Right;10 reps;Supine Heel Slides: AAROM;Right;10 reps;Supine;Limitations Heel Slides Limitations: self assist with gait belt Hip ABduction/ADduction: AAROM;Right;10 reps;Supine Straight Leg Raises: AROM;10 reps;Right;Supine Long Arc Quad: AROM;Right;10 reps;Seated Knee Flexion: Seated;AROM;Right;10 reps Goniometric ROM: Rt Knee flexion P/ROM 17-88 degrees    General Comments        Pertinent Vitals/Pain Pain Assessment: No/denies pain    Home Living                      Prior Function            PT Goals (current goals can now be found in the care  plan section) Acute Rehab PT Goals Patient Stated Goal: return to home, rehab knee PT Goal Formulation: With patient Time For Goal Achievement: 02/07/20 Potential to Achieve Goals: Good Progress towards PT goals: Progressing toward goals    Frequency    BID      PT Plan Current plan remains appropriate    Co-evaluation              AM-PAC PT "6 Clicks" Mobility   Outcome Measure  Help needed turning from your back to your side while in a flat bed without using bedrails?: None Help needed moving from lying on your back to sitting on the side of a flat bed without using bedrails?: None Help needed  moving to and from a bed to a chair (including a wheelchair)?: A Little Help needed standing up from a chair using your arms (e.g., wheelchair or bedside chair)?: A Little Help needed to walk in hospital room?: A Little Help needed climbing 3-5 steps with a railing? : A Little 6 Click Score: 20    End of Session Equipment Utilized During Treatment: Gait belt Activity Tolerance: Patient tolerated treatment well;No increased pain Patient left: with call bell/phone within reach;with SCD's reapplied;in bed;with family/visitor present Nurse Communication: Mobility status PT Visit Diagnosis: Difficulty in walking, not elsewhere classified (R26.2);Other abnormalities of gait and mobility (R26.89)     Time: 1422-1501 PT Time Calculation (min) (ACUTE ONLY): 39 min  Charges:  $Gait Training: 8-22 mins $Therapeutic Exercise: 23-37 mins                     3:08 PM, 01/24/20 Etta Grandchild, PT, DPT Physical Therapist - [facility]  [phone] ([place])    [place] C 01/24/2020, 3:05 PM

## 2020-01-24 NOTE — TOC Initial Note (Signed)
Transition of Care (TOC) - Initial/Assessment Note    Patient Details  Name: Cody Ellison. MRN: [id number] Date of Birth: April 17, 1951  Transition of Care [facility]) CM/SW Contact:    Elease Hashimoto, LCSW Phone Number: 01/24/2020, 10:45 AM  Clinical Narrative:  Met with pt who is doing well. He just had his PT session and he feels it went well. He lives with his wife who is in good health and can assist if needed. Pt was independent prior to admission and has all needed equipment from last surgery. He is aware of the [facility] to provide follow up therapies. Will get lovenox cost for pt and let him know. Continue to follow in case other discharge needs.                 Expected Discharge Plan: [place] Barriers to Discharge: Continued Medical Work up   Patient Goals and CMS Choice Patient states their goals for this hospitalization and ongoing recovery are:: Glad that surgery is done and already walked with PT today      Expected Discharge Plan and Services Expected Discharge Plan: [place] In-house Referral: Clinical Social Work   Post Acute Care Choice: [place] arrangements for the past 2 months: [place]: PT [place]: [facility] (now [facility]) Date [place]: 01/24/20 Time [place]: 15 Representative spoke with at [place]: teresa  Prior Living Arrangements/Services Living arrangements for the past 2 months: [place] with:: Spouse Patient language and need for interpreter reviewed:: No Do you feel safe going back to the place where you live?: Yes      Need for Family Participation in Patient Care: No (Comment) Care giver support system in place?: Yes (comment) Current home services: DME(has rw and elevated commode at home along with grab bar) Criminal Activity/Legal Involvement Pertinent to Current  Situation/Hospitalization: No - Comment as needed  Activities of Daily Living Home Assistive Devices/Equipment: None ADL Screening (condition at time of admission) Patient's cognitive ability adequate to safely complete daily activities?: Yes Is the patient deaf or have difficulty hearing?: No Does the patient have difficulty seeing, even when wearing glasses/contacts?: No Does the patient have difficulty concentrating, remembering, or making decisions?: No Patient able to express need for assistance with ADLs?: Yes Does the patient have difficulty dressing or bathing?: No Independently performs ADLs?: Yes (appropriate for developmental age) Does the patient have difficulty walking or climbing stairs?: No Weakness of Legs: None Weakness of Arms/Hands: None  Permission Sought/Granted Permission sought to share information with : Facility Art therapist granted to share information with : Yes, Verbal Permission Granted  Share Information with NAME: teresa  Permission granted to share info w AGENCY: [facility]        Emotional Assessment Appearance:: Appears stated age Attitude/Demeanor/Rapport: Engaged Affect (typically observed): Adaptable, Accepting Orientation: : Oriented to Self, Oriented to Place, Oriented to  Time, Oriented to Situation Alcohol / Substance Use: Never Used Psych Involvement: No (comment)  Admission diagnosis:  Total knee replacement status [Z96.659] Patient Active Problem List   Diagnosis Date Noted  . Total knee replacement status 01/23/2020  . Primary osteoarthritis of right knee 08/19/2019  . Prostate cancer ([place]) 07/03/2018  . 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 Status post total left knee replacement 07/20/2017   PCP:  [name], MD Pharm[id number] [place] [place] [address]Phone: [phone]: [address] [id number]: [phone]: [phone]    Social Determinants of Health (SDOH) Interventions    Readmission Risk Interventions No flowsheet data found.

## 2020-01-24 NOTE — Evaluation (Signed)
Physical Therapy Evaluation Patient Details Name: Cody Ellison. MRN: [id number] DOB: 1950-12-12 Today's Date: 01/24/2020   History of Present Illness  Cody Ellison is a 89yoM who comes to [facility] on 01/23/20 for elective RtTKA. Pt reports Left TKA 3YA c excellent outcome. Pt still works part-time in [employer], although semiretired.  Clinical Impression  Pt admitted with above diagnosis. Pt currently with functional limitations due to the deficits listed below (see "PT Problem List"). Upon entry, pt in bed, awake and agreeable to participate. The pt is alert and oriented x4, pleasant, conversational, and generally a good historian. Pt denies pain, but has some discomfort during gait with increased limb loading. HEP education commenced, minimal assistance needed. Pt assisted to EOB for voiding. STS  transfers and AMB both with RW and minGuard assist to supervision level. Functional mobility assessment demonstrates increased effort/time requirements, poor tolerance, and need for physical assistance, whereas the patient performed these at a higher level of independence PTA. Pt will benefit from skilled PT intervention to increase independence and safety with basic mobility in preparation for discharge to the venue listed below.       Follow Up Recommendations Follow surgeon's recommendation for DC plan and follow-up therapies;Supervision - Intermittent    Equipment Recommendations  Rolling walker with 5" wheels;3in1 (PT)    Recommendations for Other Services       Precautions / Restrictions Precautions Precautions: Fall Restrictions Weight Bearing Restrictions: Yes RLE Weight Bearing: Weight bearing as tolerated      Mobility  Bed Mobility Overal bed mobility: Modified Independent                Transfers Overall transfer level: Modified independent Equipment used: Rolling walker (2 wheeled)             General transfer comment: no cues needed, pt fluent with RW  use;  Ambulation/Gait Ambulation/Gait assistance: Min guard Gait Distance (Feet): 120 Feet Assistive device: Rolling walker (2 wheeled)       General Gait Details: 2-point gait, progressively loading RLE more during interval.  Stairs            Wheelchair Mobility    Modified Rankin (Stroke Patients Only)       Balance                                             Pertinent Vitals/Pain Pain Assessment: No/denies pain    Home Living Family/patient expects to be discharged to:: Private residence Living Arrangements: Spouse/significant other Available Help at Discharge: Family Type of Home: House Home Access: Stairs to enter   Technical brewer of Steps: 1 Home Layout: Two level Commerce: Environmental consultant - 2 wheels      Prior Function Level of Independence: Independent               Hand Dominance   Dominant Hand: Right    Extremity/Trunk Assessment   Upper Extremity Assessment Upper Extremity Assessment: Overall WFL for tasks assessed;Generalized weakness    Lower Extremity Assessment Lower Extremity Assessment: Overall WFL for tasks assessed;Generalized weakness    Cervical / Trunk Assessment Cervical / Trunk Assessment: Normal  Communication   Communication: No difficulties  Cognition Arousal/Alertness: Awake/alert Behavior During Therapy: WFL for tasks assessed/performed Overall Cognitive Status: Within Functional Limits for tasks assessed  General Comments      Exercises Total Joint Exercises Ankle Circles/Pumps: AROM;Both;15 reps;Supine Quad Sets: AROM;Right;10 reps;Supine Gluteal Sets: AROM;Right;10 reps;Supine Heel Slides: AAROM;Right;10 reps;Supine Hip ABduction/ADduction: AAROM;Right;10 reps;Supine Goniometric ROM: Rt Knee flexion P/ROM 17-88 degrees   Assessment/Plan    PT Assessment Patient needs continued PT services  PT Problem List Decreased  strength;Decreased range of motion;Decreased activity tolerance;Decreased mobility       PT Treatment Interventions DME instruction;Gait training;Stair training;Functional mobility training;Therapeutic activities;Therapeutic exercise    PT Goals (Current goals can be found in the Care Plan section)  Acute Rehab PT Goals Patient Stated Goal: return to home, rehab knee PT Goal Formulation: With patient Time For Goal Achievement: 02/07/20 Potential to Achieve Goals: Good    Frequency BID   Barriers to discharge        Co-evaluation               AM-PAC PT "6 Clicks" Mobility  Outcome Measure Help needed turning from your back to your side while in a flat bed without using bedrails?: A Lot Help needed moving from lying on your back to sitting on the side of a flat bed without using bedrails?: A Lot Help needed moving to and from a bed to a chair (including a wheelchair)?: Total Help needed standing up from a chair using your arms (e.g., wheelchair or bedside chair)?: Total Help needed to walk in hospital room?: Total Help needed climbing 3-5 steps with a railing? : Total 6 Click Score: 8    End of Session Equipment Utilized During Treatment: Gait belt Activity Tolerance: Patient tolerated treatment well Patient left: in chair;with call bell/phone within reach;with chair alarm set;with SCD's reapplied Nurse Communication: Mobility status PT Visit Diagnosis: Difficulty in walking, not elsewhere classified (R26.2);Other abnormalities of gait and mobility (R26.89)    Time: [id number] PT Time Calculation (min) (ACUTE ONLY): 35 min   Charges:   PT Evaluation $PT Eval Low Complexity: 1 Low PT Treatments $Gait Training: 8-22 mins $Therapeutic Exercise: 8-22 mins        1:34 PM, 01/24/20 Etta Grandchild, PT, DPT Physical Therapist - [facility]  [phone] ([place])    Kien Mirsky C 01/24/2020, 1:23 PM

## 2020-01-24 NOTE — Evaluation (Signed)
Occupational Therapy Evaluation Patient Details Name: Cody Ellison Ellison. MRN: [id number] DOB: 11-11-1950 Today's Date: 01/24/2020    History of Present Illness Cody Ellison is a 75yoM who comes to [facility] on 01/23/20 for elective RtTKA. Pt reports Left TKA 3YA c excellent outcome. Pt still works part-time in [employer], although semiretired.   Clinical Impression   Pt seen for OT evaluation this date, POD#1 from above surgery. 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 Pt is eager to return to PLOF with less pain and improved safety and independence. Pt currently requires minimal assist for LB dressing while in seated position due to pain and limited AROM of L knee. Pt and spouse instructed in polar care mgt, falls prevention strategies, home/routines modifications, DME/AE for LB bathing and dressing tasks, and compression stocking mgt. Pt would benefit from skilled OT services including additional instruction in dressing techniques with or without assistive devices for dressing and bathing skills to support recall and carryover prior to discharge and ultimately to maximize safety, independence, and minimize falls risk and caregiver burden. Do not currently anticipate any OT needs following this hospitalization.      Follow Up Recommendations  No OT follow up    Equipment Recommendations  Tub/shower seat    Recommendations for Other Services       Precautions / Restrictions Precautions Precautions: Fall Restrictions Weight Bearing Restrictions: Yes RLE Weight Bearing: Weight bearing as tolerated      Mobility Bed Mobility Overal bed mobility: Modified Independent             General bed mobility comments: pt up in recliner when OT presents  Transfers Overall transfer level: Needs assistance Equipment used: Rolling walker (2 wheeled) Transfers: Sit to/from Stand Sit to Stand: Min guard;Supervision          General transfer comment: Pt demos good use of RW, some light hands on assistance provided on first trial for safety, second trial completed with Supv only    Balance Overall balance assessment: Modified Independent                                         ADL either performed or assessed with clinical judgement   ADL Overall ADL's : Needs assistance/impaired                                       General ADL Comments: MOD A for LB dressing/bathing seated, AE for LB ADL education provided. Setup to Indep with seated UB ADLs     Vision Baseline Vision/History: Wears glasses Patient Visual Report: No change from baseline       Perception     Praxis      Pertinent Vitals/Pain Pain Assessment: No/denies pain     Hand Dominance Right   Extremity/Trunk Assessment Upper Extremity Assessment Upper Extremity Assessment: Overall WFL for tasks assessed;Generalized weakness   Lower Extremity Assessment Lower Extremity Assessment: Overall WFL for tasks assessed;Generalized weakness   Cervical / Trunk Assessment Cervical / Trunk Assessment: Normal   Communication Communication Communication: No difficulties   Cognition Arousal/Alertness: Awake/alert Behavior During Therapy: WFL for tasks assessed/performed Overall Cognitive Status: Within Functional Limits for tasks assessed  General Comments       Exercises Total Joint Exercises Short Arc Quad: AROM;Right;10 reps;Supine Heel Slides: AAROM;Right;10 reps;Supine;Limitations Heel Slides Limitations: self assist with gait belt Straight Leg Raises: AROM;10 reps;Right;Supine Long Arc Quad: AROM;Right;10 reps;Seated Knee Flexion: Seated;AROM;Right;10 reps Other Exercises Other Exercises: OT facilitates education re: use of AE for LB ADLs Other Exercises: OT facilitates education re: polar care and compression stocking mgt. Pt and spouse demo  good understanding.   Shoulder Instructions      Home Living Family/patient expects to be discharged to:: Private residence Living Arrangements: Spouse/significant other Available Help at Discharge: Family Type of Home: House Home Access: Stairs to enter Technical brewer of Steps: 1   Home Layout: Two level Alternate Level Stairs-Number of Steps: 13 steps; previously slept in recliner 3YA Alternate Level Stairs-Rails: Right;Left;Can reach both Bathroom Shower/Tub: Teacher, early years/pre: Standard     Home Equipment: Environmental consultant - 2 wheels   Additional Comments: RW from last knee sx in 2018, does not otherwise use      Prior Functioning/Environment Level of Independence: Independent        Comments: Ind with ADLs, still working PRN for himself as a plummer. +driving.        OT Problem List: Decreased range of motion;Decreased activity tolerance;Decreased strength;Decreased knowledge of use of DME or AE;Decreased knowledge of precautions      OT Treatment/Interventions: Self-care/ADL training;Therapeutic exercise;Energy conservation;DME and/or AE instruction;Therapeutic activities;Patient/family education;Balance training    OT Goals(Current goals can be found in the care plan section) Acute Rehab OT Goals Patient Stated Goal: return to home, rehab knee OT Goal Formulation: With patient Time For Goal Achievement: 02/07/20 Potential to Achieve Goals: Good  OT Frequency: Min 2X/week   Barriers to D/C:            Co-evaluation              AM-PAC OT "6 Clicks" Daily Activity     Outcome Measure Help from another person eating meals?: None Help from another person taking care of personal grooming?: None Help from another person toileting, which includes using toliet, bedpan, or urinal?: A Little Help from another person bathing (including washing, rinsing, drying)?: A Lot Help from another person to put on and taking off regular upper body  clothing?: None Help from another person to put on and taking off regular lower body clothing?: A Lot 6 Click Score: 19   End of Session Equipment Utilized During Treatment: Gait belt;Rolling walker Nurse Communication: Mobility status  Activity Tolerance: Patient tolerated treatment well Patient left: in chair;with call bell/phone within reach;with chair alarm set  OT Visit Diagnosis: Other abnormalities of gait and mobility (R26.89)                Time: [id number] OT Time Calculation (min): 38 min Charges:  OT General Charges $OT Visit: 1 Visit OT Evaluation $OT Eval Moderate Complexity: 1 Mod OT Treatments $Self Care/Home Management : 8-22 mins $Therapeutic Activity: 8-22 mins  Gerrianne Scale, MS, OTR/L ascom [phone] 01/24/20, 5:41 PM

## 2020-01-25 MED ORDER — ENOXAPARIN SODIUM 40 MG/0.4ML ~~LOC~~ SOLN: 40.0000 mg | SUBCUTANEOUS | 0 refills | Status: DC

## 2020-01-25 MED ORDER — CELECOXIB 200 MG PO CAPS: 200.0000 mg | ORAL_CAPSULE | Freq: Two times a day (BID) | ORAL | 0 refills | Status: DC

## 2020-01-25 MED ORDER — TRAMADOL HCL 50 MG PO TABS: 50.0000 mg | ORAL_TABLET | ORAL | 0 refills | Status: DC | PRN

## 2020-01-25 MED ORDER — OXYCODONE HCL 5 MG PO TABS: 5.0000 mg | ORAL_TABLET | ORAL | 0 refills | Status: DC | PRN

## 2020-01-25 NOTE — Progress Notes (Signed)
  Subjective: 2 Days Post-Op Procedure(s) (LRB): COMPUTER ASSISTED TOTAL KNEE ARTHROPLASTY (Right) Patient reports pain as well-controlled.   Patient is well, and has had no acute complaints or problems Plan is to go Home after hospital stay. Negative for chest pain and shortness of breath Fever: no Gastrointestinal: negative for nausea and vomiting.  Patient has had a bowel movement.  Objective: Vital signs in last 24 hours: Temp:  [97.7 F (36.5 C)-98.6 F (37 C)] 98 F (36.7 C) (03/26 0738) Pulse Rate:  [61-73] 73 (03/26 0738) Resp:  [16-20] 17 (03/26 0738) BP: (121-158)/(66-86) 154/86 (03/26 0738) SpO2:  [93 %-97 %] 97 % (03/26 0738)  Intake/Output from previous day:  Intake/Output Summary (Last 24 hours) at 01/25/2020 0824 Last data filed at 01/25/2020 0529 Gross per 24 hour  Intake --  Output 410 ml  Net -410 ml    Intake/Output this shift: No intake/output data recorded.  Labs: No results for input(s): HGB in the last 72 hours. No results for input(s): WBC, RBC, HCT, PLT in the last 72 hours. No results for input(s): NA, K, CL, CO2, BUN, CREATININE, GLUCOSE, CALCIUM in the last 72 hours. No results for input(s): LABPT, INR in the last 72 hours.   EXAM General - Patient is Alert, Appropriate and Oriented Extremity - Neurovascular intact Dorsiflexion/Plantar flexion intact Compartment soft Dressing/Incision -Postoperative dressing remains in place., Polar Care in place and working. , Hemovac in place. ,  Following removal of postoperative dressing, minimal drainage noted. Motor Function - intact, moving foot and toes well on exam.  Cardiovascular- Regular rate and rhythm, no murmurs/rubs/gallops Respiratory- Lungs clear to auscultation bilaterally Gastrointestinal- soft and nontender   Assessment/Plan: 2 Days Post-Op Procedure(s) (LRB): COMPUTER ASSISTED TOTAL KNEE ARTHROPLASTY (Right) Active Problems:   Total knee replacement status  Estimated body mass  index is 32.34 kg/m as calculated from the following:   Height as of this encounter: 5\' 9"  (1.753 m).   Weight as of this encounter: 99.3 kg. Advance diet Up with therapy Discharge home with home health  Postoperative dressing and Hemovac removed.  Fresh honeycomb dressing placed.  DVT Prophylaxis - Lovenox, Ted hose and foot pumps Weight-Bearing as tolerated to right leg  Cassell Smiles, PA-C [facility] Orthopaedic Surgery 01/25/2020, 8:24 AM

## 2020-01-25 NOTE — Discharge Summary (Signed)
Physician Discharge Summary  Patient ID: Cody Spry Sr. MRN: [id number] DOB/AGE: May 10, 1951 69 y.o.  Admit date: 01/23/2020 Discharge date: 01/25/2020  Admission Diagnoses:  Total knee replacement status [Z96.659]  Surgeries:Procedure(s):  Right total knee arthroplasty using computer-assisted navigation  SURGEON:  Marciano Sequin. M.D.  ASSISTANT: Cassell Smiles, PA-C (present and scrubbed throughout the case, critical for assistance with exposure, retraction, instrumentation, and closure)  ANESTHESIA: spinal  ESTIMATED BLOOD LOSS: 50 mL  FLUIDS REPLACED: 1300 mL of crystalloid  TOURNIQUET TIME: 98 minutes  DRAINS: 2 medium Hemovac drains  SOFT TISSUE RELEASES: Anterior cruciate ligament, posterior cruciate ligament, deep medial collateral ligament, patellofemoral ligament  IMPLANTS UTILIZED: DePuy Attune size 6 posterior stabilized femoral component (cemented), size 7 rotating platform tibial component (cemented), 41 mm medialized dome patella (cemented), and a 5 mm stabilized rotating platform polyethylene insert. Discharge Diagnoses: Patient Active Problem List   Diagnosis Date Noted  . Total knee replacement status 01/23/2020  . Primary osteoarthritis of right knee 08/19/2019  . Prostate cancer ([place]) 07/03/2018  . Type 2 diabetes mellitus with peripheral angiopathy ([place]) 04/12/2018  . Status post total left knee replacement 07/20/2017    Past Medical History:  Diagnosis Date  . Arthritis    lower back   . Cancer [facility])    prostate cancer   . Coronary artery disease    stent RCA- 2000 and 12/2009   . GERD (gastroesophageal reflux disease)   . Hyperlipidemia   . Hypertension   . Skin cancer      Transfusion:    Consultants (if any):   Discharged Condition: Improved  Hospital Course: Cody YELLEN Sr. is an 69 y.o. male who was admitted 01/23/2020 with a diagnosis of right knee osteoarthritis and went to the operating room on 01/23/2020 and  underwent right total knee arthroplasty. The patient received perioperative antibiotics for prophylaxis (see below). The patient tolerated the procedure well and was transported to PACU in stable condition. After meeting PACU criteria, the patient was subsequently transferred to the Orthopaedics/Rehabilitation unit.   The patient received DVT prophylaxis in the form of early mobilization, Lovenox, Foot Pumps and TED hose. A sacral pad had been placed and heels were elevated off of the bed with rolled towels in order to protect skin integrity. Foley catheter was discontinued on postoperative day #0. Wound drains were discontinued on postoperative day #2. The surgical incision was healing well without signs of infection.  Physical therapy was initiated postoperatively for transfers, gait training, and strengthening. Occupational therapy was initiated for activities of daily living and evaluation for assisted devices. Rehabilitation goals were reviewed in detail with the patient. The patient made steady progress with physical therapy and physical therapy recommended discharge to Home.   The patient achieved his preliminary goals of this hospitalization and was felt to be medically and orthopaedically appropriate for discharge.  He was given perioperative antibiotics:  Anti-infectives (From admission, onward)   Start     Dose/Rate Route Frequency Ordered Stop   01/23/20 1800  clindamycin (CLEOCIN) IVPB 600 mg     600 mg 100 mL/hr over 30 Minutes Intravenous Every 6 hours 01/23/20 1738 01/24/20 1347   01/23/20 0945  clindamycin (CLEOCIN) 900 MG/50ML IVPB    Note to Pharmacy: Trudie Reed   : cabinet override      01/23/20 0945 01/23/20 1236   01/23/20 0915  clindamycin (CLEOCIN) IVPB 900 mg     900 mg 100 mL/hr over 30 Minutes Intravenous On call to  O.R. 01/23/20 0911 01/23/20 1230    .  Recent vital signs:  Vitals:   01/25/20 0012 01/25/20 0738  BP: 121/66 (!) 154/86  Pulse: 68 73  Resp: 16 17    Temp: 98.6 F (37 C) 98 F (36.7 C)  SpO2: 93% 97%    Recent laboratory studies:  No results for input(s): WBC, HGB, HCT, PLT, K, CL, CO2, BUN, CREATININE, GLUCOSE, CALCIUM, LABPT, INR in the last 72 hours.  Diagnostic Studies: DG Knee Right Port  Result Date: 01/23/2020 CLINICAL DATA:  Postoperative evaluation. EXAM: PORTABLE RIGHT KNEE - 1-2 VIEW COMPARISON:  None. FINDINGS: No evidence of acute fracture or dislocation. A right knee replacement is seen without evidence of surrounding lucency to suggest the presence of hardware loosening or infection. A moderate sized joint effusion is seen. A surgical drain is seen extending into this region. Multiple radiopaque skin staples are seen along the midline of the anterior right knee. IMPRESSION: 1. Status post right knee replacement without evidence of hardware loosening or infection. 2. Moderate sized joint effusion. Electronically Signed   By: Virgina Norfolk M.D.   On: 01/23/2020 16:46    Discharge Medications:   Allergies as of 01/25/2020      Reactions   Ampicillin Other (See Comments)   Raw mouth and throat    Atorvastatin    Other reaction(s): Unknown   Carvedilol    Other reaction(s): Dizziness   Contrast Media [iodinated Diagnostic Agents] Hives   Diltiazem Hcl Other (See Comments)   Fluvastatin Other (See Comments)   Lovastatin Other (See Comments)   Sulfa Antibiotics Hives   Betadine [povidone Iodine] Rash      Medication List    STOP taking these medications   aspirin EC 81 MG tablet   naproxen sodium 220 MG tablet Commonly known as: ALEVE     TAKE these medications   acetaminophen 500 MG tablet Commonly known as: TYLENOL Take 1,000 mg by mouth 2 (two) times daily as needed.   atenolol 100 MG tablet Commonly known as: TENORMIN Take 100 mg by mouth every morning.   b complex vitamins capsule Take 1 capsule by mouth daily.   CALTRATE 600+D PLUS PO Take 1 tablet by mouth daily.   celecoxib 200 MG  capsule Commonly known as: CELEBREX Take 1 capsule (200 mg total) by mouth 2 (two) times daily.   CoQ10 100 MG Caps Take 100 mg by mouth at bedtime.   enoxaparin 40 MG/0.4ML injection Commonly known as: LOVENOX Inject 0.4 mLs (40 mg total) into the skin daily for 14 days.   felodipine 10 MG 24 hr tablet Commonly known as: PLENDIL Take 10 mg by mouth at bedtime.   folic acid Q000111Q MCG tablet Commonly known as: FOLVITE Take 800 mcg by mouth at bedtime.   lisinopril-hydrochlorothiazide 20-12.5 MG tablet Commonly known as: ZESTORETIC Take 1 tablet by mouth every morning.   Lupron Depot (42-Month) 45 MG injection Generic drug: Leuprolide Acetate (6 Month) Inject 45 mg into the muscle every 6 (six) months.   Mucinex Sinus-Max Congestion 5-325-200 MG Tabs Generic drug: Phenylephrine-APAP-guaiFENesin Take 2 tablets by mouth daily as needed (congestion).   Niacin Flush Free 500 MG Caps Generic drug: Inositol Niacinate Take 1,000 mg by mouth in the morning and at bedtime.   omeprazole 20 MG capsule Commonly known as: PRILOSEC Take 20 mg by mouth daily.   oxyCODONE 5 MG immediate release tablet Commonly known as: Oxy IR/ROXICODONE Take 1 tablet (5 mg total) by mouth  every 4 (four) hours as needed for moderate pain (pain score 4-6).   simvastatin 40 MG tablet Commonly known as: ZOCOR Take 40 mg by mouth at bedtime.   traMADol 50 MG tablet Commonly known as: ULTRAM Take 1 tablet (50 mg total) by mouth every 4 (four) hours as needed for moderate pain.   triamcinolone 55 MCG/ACT Aero nasal inhaler Commonly known as: NASACORT Place 2 sprays into the nose daily.   vitamin B-12 1000 MCG tablet Commonly known as: CYANOCOBALAMIN Take 1,000 mcg by mouth daily.   vitamin E 180 MG (400 UNITS) capsule Take 400 Units by mouth daily.            Durable Medical Equipment  (From admission, onward)         Start     Ordered   01/23/20 1739  DME Walker rolling  Once     Question:  Patient needs a walker to treat with the following condition  Answer:  Total knee replacement status   01/23/20 1738   01/23/20 1739  DME Bedside commode  Once    Question:  Patient needs a bedside commode to treat with the following condition  Answer:  Total knee replacement status   01/23/20 1738          Disposition: home with home health PT     Follow-up Information    [facility] On 02/07/2020.   Specialty: Orthopedic Surgery Why: at 9:15am Contact information: [place] [address] [phone]        [name], MD On 03/06/2020.   Specialty: Orthopedic Surgery Why: at 9:45am Contact information: [place] [address] [place], PA-C 01/25/2020, 8:35 AM

## 2020-01-25 NOTE — Progress Notes (Signed)
D: Pt alert and oriented x 4. Pt denies experiencing any pain at this time.  A: Pt and wife received discharge and medication education/information. Pt belongings were gathered and taken with pt upon discharge to include one bone foam, one polar care, ted hose, and two dressing changes.   R: Pt and wife verbalized understanding of discharge and medication education/information.  Pt escorted to medical mall front lobby via staff by wheelchair where wife picked pt up.

## 2020-01-25 NOTE — TOC Transition Note (Signed)
Transition of Care [facility]) - CM/SW Discharge Note   Patient Details  Name: Cody LANIUS Sr. MRN: [id number] Date of Birth: May 21, 1951  Transition of Care [facility]) CM/SW Contact:  Elease Hashimoto, LCSW Phone Number: 01/25/2020, 8:53 AM   Clinical Narrative:  Pt ready to go home today, home health set up via [facility] and pt has no equipment needs. Wife can provide assist if needed. Pt feels ready for discharge today. Both wife and pt feel ready for discharge today. No further follow     Final next level of care: Home w Home Health Services Barriers to Discharge: Barriers Resolved   Patient Goals and CMS Choice Patient states their goals for this hospitalization and ongoing recovery are:: Glad that surgery is done and already walked with PT today      Discharge Placement                Patient to be transferred to facility by: HOme via car by wife Name of family member notified: wife at bedside Patient and family notified of of transfer: 01/25/20  Discharge Plan and Services In-house Referral: Clinical Social Work   Post Acute Care Choice: [place]: PT [place]: [facility] (now [facility]) Date [place]: 01/24/20 Time [place]: 4 Representative spoke with at [place]: teresa  Social Determinants of Health ([place]) Interventions     Readmission Risk Interventions No flowsheet data found.

## 2020-01-25 NOTE — Care Management Important Message (Signed)
Important Message  Patient Details  Name: Cody STRANGER Sr. MRN: [id number] Date of Birth: 1951/05/04   Medicare Important Message Given:  N/A - LOS <3 / Initial given by admissions     Juliann Pulse A Jamicia Haaland 01/25/2020, 8:02 AM

## 2020-01-25 NOTE — Progress Notes (Signed)
Physical Therapy Treatment Patient Details Name: Cody Ellison. MRN: [id number] DOB: 30-Dec-1950 Today's Date: 01/25/2020    History of Present Illness Cody Ellison is a 28yoM who comes to [facility] on 01/23/20 for elective RtTKA. Pt reports Left TKA 3YA c excellent outcome. Pt still works part-time in [employer], although semiretired.    PT Comments    Pt pleasant and motivated to participate during the session.  Pt made good progress this date with pt able to amb 150' with step-through gait pattern with very little support from the RW.  Pt was steady ascending and descending both 1 step with a RW as well as 4 steps x 2 with B rails demonstrating good control and stability throughout.  No adverse symptoms noted during the session other than mild R knee pain.  Pt will benefit from continued PT services per surgeon's guidelines upon discharge to safely address deficits listed in patient problem list for decreased caregiver assistance and eventual return to PLOF.     Follow Up Recommendations  Follow surgeon's recommendation for DC plan and follow-up therapies;Supervision - Intermittent     Equipment Recommendations  Rolling walker with 5" wheels;3in1 (PT)    Recommendations for Other Services       Precautions / Restrictions Precautions Precautions: Fall Restrictions Weight Bearing Restrictions: Yes RLE Weight Bearing: Weight bearing as tolerated    Mobility  Bed Mobility Overal bed mobility: Modified Independent             General bed mobility comments: Extra time and effort only  Transfers Overall transfer level: Needs assistance Equipment used: Rolling walker (2 wheeled) Transfers: Sit to/from Stand Sit to Stand: Supervision         General transfer comment: Min verbal cues for sequencing with good eccentric and concentric control  Ambulation/Gait Ambulation/Gait assistance: Min guard Gait Distance (Feet): 150 Feet Assistive device: Rolling walker (2  wheeled) Gait Pattern/deviations: Step-through pattern;Antalgic;Decreased stance time - right Gait velocity: decreased   General Gait Details: Min verbal cues for sequencing for decreased UE support on the RW   Stairs Stairs: Yes Stairs assistance: Min guard;Supervision Stair Management: Step to pattern;Forwards;No rails;Two rails Number of Stairs: 8 General stair comments: Ascend/descend 1 step with RW and 4 steps x 2 with B rails with min verbal cues for sequencing with good eccentric and concentric control   Wheelchair Mobility    Modified Rankin (Stroke Patients Only)       Balance Overall balance assessment: No apparent balance deficits (not formally assessed)                                          Cognition Arousal/Alertness: Awake/alert Behavior During Therapy: WFL for tasks assessed/performed Overall Cognitive Status: Within Functional Limits for tasks assessed                                        Exercises Total Joint Exercises Ankle Circles/Pumps: AROM;Both;15 reps;10 reps Quad Sets: AROM;Right;10 reps;15 reps Heel Slides: Right;10 reps;AROM;5 reps Hip ABduction/ADduction: Right;10 reps;AROM Straight Leg Raises: AROM;10 reps;Right Long Arc Quad: AROM;Right;10 reps;Seated;15 reps Knee Flexion: Seated;AROM;Right;10 reps;15 reps Goniometric ROM: R Knee AROM: 0-110 deg Marching in Standing: AROM;Both;10 reps;Standing Other Exercises Other Exercises: RLE positioning education to promote R knee ext PROM Other Exercises: 90 deg R  turn training to prevent CKC twisting on the R knee Other Exercises: HEP education and review per handout Other Exercises: Car transfer sequencing education    General Comments        Pertinent Vitals/Pain Pain Assessment: 0-10 Pain Score: 2  Pain Location: R knee Pain Descriptors / Indicators: Sore Pain Intervention(s): Monitored during session;Premedicated before session    Home Living                       Prior Function            PT Goals (current goals can now be found in the care plan section) Progress towards PT goals: Progressing toward goals    Frequency    BID      PT Plan Current plan remains appropriate    Co-evaluation              AM-PAC PT "6 Clicks" Mobility   Outcome Measure  Help needed turning from your back to your side while in a flat bed without using bedrails?: None Help needed moving from lying on your back to sitting on the side of a flat bed without using bedrails?: None Help needed moving to and from a bed to a chair (including a wheelchair)?: A Little Help needed standing up from a chair using your arms (e.g., wheelchair or bedside chair)?: A Little Help needed to walk in hospital room?: A Little Help needed climbing 3-5 steps with a railing? : A Little 6 Click Score: 20    End of Session Equipment Utilized During Treatment: Gait belt Activity Tolerance: Patient tolerated treatment well Patient left: with call bell/phone within reach;with SCD's reapplied;in bed;with chair alarm set;Other (comment)(Polar care donned to R knee) Nurse Communication: Mobility status PT Visit Diagnosis: Difficulty in walking, not elsewhere classified (R26.2);Other abnormalities of gait and mobility (R26.89)     Time: [id number] PT Time Calculation (min) (ACUTE ONLY): 46 min  Charges:  $Gait Training: 8-22 mins $Therapeutic Exercise: 8-22 mins $Therapeutic Activity: 8-22 mins                     D. Scott Esley Brooking PT, DPT 01/25/20, 12:14 PM

## 2020-02-16 NOTE — Progress Notes (Signed)
[place]  Telephone:([phone]) [phone] Fax:(336) [phone]  ID: Cody Ellison Sr. OB: 26-Aug-1951  MR#: [id number]  [id number]  Patient Care Team: [name], MD as PCP - General (Internal Medicine)  CHIEF COMPLAINT: Stage IIIb prostate cancer  INTERVAL HISTORY: Patient returns to clinic today for repeat laboratory work and continuation of Eligard.  He has noticed mild increase in fatigue as well as hot flashes, but otherwise feels well.  He denies any pain.  He has no neurologic complaints.  He denies any recent fevers or illnesses.  He has a good appetite and denies weight loss.  He denies any chest pain, shortness of breath, cough, or hemoptysis.  He denies any nausea, vomiting, constipation, or diarrhea.  He has no urinary complaints.  Patient offers no further specific complaints today.  REVIEW OF SYSTEMS:   Review of Systems  Constitutional: Positive for malaise/fatigue. Negative for fever and weight loss.  Respiratory: Negative.  Negative for cough, hemoptysis and shortness of breath.   Cardiovascular: Negative.  Negative for chest pain and leg swelling.  Gastrointestinal: Negative.  Negative for abdominal pain.  Genitourinary: Negative.  Negative for dysuria and hematuria.  Musculoskeletal: Negative.  Negative for back pain.  Skin: Negative.  Negative for rash.  Neurological: Positive for sensory change. Negative for dizziness, focal weakness, weakness and headaches.  Psychiatric/Behavioral: Negative.  The patient is not nervous/anxious.     As per HPI. Otherwise, a complete review of systems is negative.  PAST MEDICAL HISTORY: Past Medical History:  Diagnosis Date  . Arthritis    lower back   . Cancer [facility])    prostate cancer   . Coronary artery disease    stent RCA- 2000 and 12/2009   . GERD (gastroesophageal reflux disease)   . Hyperlipidemia   . Hypertension   . Skin cancer     PAST SURGICAL HISTORY: Past Surgical History:  Procedure  Laterality Date  . APPENDECTOMY    . COLONOSCOPY WITH PROPOFOL N/A 07/18/2017   Procedure: COLONOSCOPY WITH PROPOFOL;  Surgeon: [name], MD;  Location: [facility] ENDOSCOPY;  Service: Endoscopy;  Laterality: N/A;  . CORONARY ANGIOPLASTY    . coronary stents      x 2  . KNEE ARTHROPLASTY Left 07/20/2017   Procedure: COMPUTER ASSISTED TOTAL KNEE ARTHROPLASTY;  Surgeon: [name], MD;  Location: [facility] ORS;  Service: Orthopedics;  Laterality: Left;  . KNEE ARTHROPLASTY Right 01/23/2020   Procedure: COMPUTER ASSISTED TOTAL KNEE ARTHROPLASTY;  Surgeon: [name], MD;  Location: [facility] ORS;  Service: Orthopedics;  Laterality: Right;  . ROBOT ASSISTED LAPAROSCOPIC RADICAL PROSTATECTOMY N/A 07/16/2013   Procedure: ROBOTIC ASSISTED LAPAROSCOPIC RADICAL PROSTATECTOMY LEVEL 2, bilateral pelvic lymphadenectomy;  Surgeon: [name], MD;  Location: [facility] ORS;  Service: Urology;  Laterality: N/A;  . TONSILLECTOMY     and adenoidectomy     FAMILY HISTORY: Family History  Problem Relation Age of Onset  . Cancer Mother 74       cervical cancer  . Heart attack Father   . Thyroid cancer Daughter 45       doing well    ADVANCED DIRECTIVES (Y/N):  N  HEALTH MAINTENANCE: Social History   Tobacco Use  . Smoking status: Former Smoker    Packs/day: 1.00    Years: 30.00    Pack years: 30.00    Types: Cigarettes    Quit date: 11/01/1992    Years since quitting: 27.3  . Smokeless tobacco: Never Used  Substance  Use Topics  . Alcohol use: No  . Drug use: No     Colonoscopy:  PAP:  Bone density:  Lipid panel:  Allergies  Allergen Reactions  . Ampicillin Other (See Comments)    Raw mouth and throat   . Atorvastatin     Other reaction(s): Unknown  . Carvedilol     Other reaction(s): Dizziness  . Contrast Media [Iodinated Diagnostic Agents] Hives  . Diltiazem Hcl Other (See Comments)  . Fluvastatin Other (See Comments)  . Lovastatin Other (See Comments)  . Sulfa Antibiotics Hives  .  Betadine [Povidone Iodine] Rash    Current Outpatient Medications  Medication Sig Dispense Refill  . acetaminophen (TYLENOL) 500 MG tablet Take 1,000 mg by mouth 2 (two) times daily as needed.     [name] atenolol (TENORMIN) 100 MG tablet Take 100 mg by mouth every morning.    [name] b complex vitamins capsule Take 1 capsule by mouth daily.    . Calcium Carbonate-Vit D-Min (CALTRATE 600+D PLUS PO) Take 1 tablet by mouth daily.     . celecoxib (CELEBREX) 200 MG capsule Take 1 capsule (200 mg total) by mouth 2 (two) times daily. 84 capsule 0  . Coenzyme Q10 (COQ10) 100 MG CAPS Take 100 mg by mouth at bedtime.     . felodipine (PLENDIL) 10 MG 24 hr tablet Take 10 mg by mouth at bedtime.     . folic acid (FOLVITE) [id number] MCG tablet Take 800 mcg by mouth at bedtime.     . Inositol Niacinate (NIACIN FLUSH FREE) 500 MG CAPS Take 1,000 mg by mouth in the morning and at bedtime.    [name] Leuprolide Acetate, 6 Month, (LUPRON DEPOT, 37-MONTH,) 45 MG injection Inject 45 mg into the muscle every 6 (six) months.    [name] lisinopril-hydrochlorothiazide (PRINZIDE,ZESTORETIC) 20-12.5 MG per tablet Take 1 tablet by mouth every morning.    [name] omeprazole (PRILOSEC) 20 MG capsule Take 20 mg by mouth daily.    [name] oxyCODONE (OXY IR/ROXICODONE) 5 MG immediate release tablet Take 1 tablet (5 mg total) by mouth every 4 (four) hours as needed for moderate pain (pain score 4-6). 30 tablet 0  . Phenylephrine-APAP-Guaifenesin (MUCINEX SINUS-MAX CONGESTION) 5-325-200 MG TABS Take 2 tablets by mouth daily as needed (congestion).     . simvastatin (ZOCOR) 40 MG tablet Take 40 mg by mouth at bedtime.  3  . traMADol (ULTRAM) 50 MG tablet Take 1 tablet (50 mg total) by mouth every 4 (four) hours as needed for moderate pain. 30 tablet 0  . triamcinolone (NASACORT) 55 MCG/ACT AERO nasal inhaler Place 2 sprays into the nose daily.     . vitamin B-12 (CYANOCOBALAMIN) 1000 MCG tablet Take 1,000 mcg by mouth daily.    . vitamin E 400 UNIT capsule Take 400  Units by mouth daily.    [name] enoxaparin (LOVENOX) 40 MG/0.4ML injection Inject 0.4 mLs (40 mg total) into the skin daily for 14 days. 5.6 mL 0   No current facility-administered medications for this visit.   Facility-Administered Medications Ordered in Other Visits  Medication Dose Route Frequency Provider Last Rate Last Admin  . leuprolide (6 Month) (ELIGARD) injection 45 mg  45 mg Subcutaneous Once Lloyd Huger, MD      . Leuprolide Acetate (6 Month) (LUPRON) injection 45 mg  45 mg Intramuscular Once Lloyd Huger, MD        OBJECTIVE: Vitals:   02/22/20 1021  BP: 116/69  Pulse: 69  Resp: 20  Temp: (!) 97.5 F (36.4 C)  SpO2: 98%     Body mass index is 31.99 kg/m.    ECOG FS:0 - Asymptomatic  General: Well-developed, well-nourished, no acute distress. Eyes: Pink conjunctiva, anicteric sclera. HEENT: Normocephalic, moist mucous membranes. Lungs: No audible wheezing or coughing. Heart: Regular rate and rhythm. Abdomen: Soft, nontender, no obvious distention. Musculoskeletal: No edema, cyanosis, or clubbing. Neuro: Alert, answering all questions appropriately. Cranial nerves grossly intact. Skin: No rashes or petechiae noted. Psych: Normal affect.   LAB RESULTS:  Lab Results  Component Value Date   NA 138 01/15/2020   K 4.1 01/15/2020   CL 104 01/15/2020   CO2 26 01/15/2020   GLUCOSE 127 (H) 01/15/2020   BUN 21 01/15/2020   CREATININE 1.08 01/15/2020   CALCIUM 9.8 01/15/2020   PROT 6.7 01/15/2020   ALBUMIN 4.1 01/15/2020   AST 23 01/15/2020   ALT 32 01/15/2020   ALKPHOS 71 01/15/2020   BILITOT 1.3 (H) 01/15/2020   GFRNONAA >60 01/15/2020   GFRAA >60 01/15/2020    Lab Results  Component Value Date   WBC 6.7 01/15/2020   HGB 13.6 01/15/2020   HCT 39.9 01/15/2020   MCV 91.7 01/15/2020   PLT 194 01/15/2020     STUDIES: DG Knee Right Port  Result Date: 01/23/2020 CLINICAL DATA:  Postoperative evaluation. EXAM: PORTABLE RIGHT KNEE - 1-2 VIEW  COMPARISON:  None. FINDINGS: No evidence of acute fracture or dislocation. A right knee replacement is seen without evidence of surrounding lucency to suggest the presence of hardware loosening or infection. A moderate sized joint effusion is seen. A surgical drain is seen extending into this region. Multiple radiopaque skin staples are seen along the midline of the anterior right knee. IMPRESSION: 1. Status post right knee replacement without evidence of hardware loosening or infection. 2. Moderate sized joint effusion. Electronically Signed   By: Virgina Norfolk M.D.   On: 01/23/2020 16:46    ASSESSMENT: Stage IIIb prostate cancer  PLAN:    1.  Stage IIIb prostate cancer: Patient underwent radical prostatectomy on July 06, 2013.  Final pathology reported Gleason 7 (4+3), extracapsular extension, but margins were clear.  Seminal vesicles were not involved and 0 of 8 lymph nodes were negative for disease.  Patient noted to have a rising PSA and underwent salvage XRT in Spring of 2017.  Nuclear med bone scan on May 30, 2017 revealed improvement of uptake in the right posterior inferior pubic ramus and distal left femoral metadiaphysis.  PET scan on October 31, 2018 revealed no evidence of disease.  Patient reinitiated Lupron/Eligard on November 02, 2018.  His PSA has trended up slightly to 1.39.  Despite this, will proceed with treatment today.  Return to clinic in 6 months with repeat laboratory work and continuation of treatment.  If his PSA continues to trend up, we will hold treatment and repeat imaging for further evaluation.   2.  Renal insufficiency: Chronic and unchanged.   Patient expressed understanding and was in agreement with this plan. He also understands that He can call clinic at any time with any questions, concerns, or complaints.   Cancer Staging Prostate cancer [facility]) Staging form: Prostate, AJCC 8th Edition - Clinical stage from 07/08/2018: Stage IIIB (cT3a, cN0, cM0, Grade  Group: 3) - Signed by Lloyd Huger, MD on 07/08/2018   Lloyd Huger, MD   02/22/2020 11:01 AM

## 2020-02-20 ENCOUNTER — Other Ambulatory Visit: Payer: Self-pay

## 2020-02-20 ENCOUNTER — Inpatient Hospital Stay: Payer: Medicare Other | Attending: Oncology

## 2020-02-20 DIAGNOSIS — Z5111 Encounter for antineoplastic chemotherapy: Secondary | ICD-10-CM | POA: Insufficient documentation

## 2020-02-20 DIAGNOSIS — C61 Malignant neoplasm of prostate: Secondary | ICD-10-CM | POA: Insufficient documentation

## 2020-02-20 LAB — PSA: Prostatic Specific Antigen: 1.79 ng/mL (ref 0.00–4.00)

## 2020-02-21 DIAGNOSIS — Z9861 Coronary angioplasty status: Secondary | ICD-10-CM | POA: Insufficient documentation

## 2020-02-21 DIAGNOSIS — R0989 Other specified symptoms and signs involving the circulatory and respiratory systems: Secondary | ICD-10-CM | POA: Insufficient documentation

## 2020-02-22 ENCOUNTER — Encounter: Payer: Self-pay | Admitting: Oncology

## 2020-02-22 ENCOUNTER — Inpatient Hospital Stay (HOSPITAL_BASED_OUTPATIENT_CLINIC_OR_DEPARTMENT_OTHER): Payer: Medicare Other | Admitting: Oncology

## 2020-02-22 ENCOUNTER — Other Ambulatory Visit: Payer: Self-pay | Admitting: Emergency Medicine

## 2020-02-22 ENCOUNTER — Inpatient Hospital Stay: Payer: Medicare Other

## 2020-02-22 ENCOUNTER — Other Ambulatory Visit: Payer: Self-pay

## 2020-02-22 VITALS — BP 116/69 | HR 69 | Temp 97.5°F | Resp 20 | Wt 216.6 lb

## 2020-02-22 DIAGNOSIS — C61 Malignant neoplasm of prostate: Secondary | ICD-10-CM | POA: Diagnosis not present

## 2020-02-22 DIAGNOSIS — Z5111 Encounter for antineoplastic chemotherapy: Secondary | ICD-10-CM | POA: Diagnosis not present

## 2020-02-22 MED ORDER — LEUPROLIDE ACETATE (6 MONTH) 45 MG ~~LOC~~ KIT
45.0000 mg | PACK | Freq: Once | SUBCUTANEOUS | Status: AC
  Administered 2020-02-22: 45 mg via SUBCUTANEOUS

## 2020-02-22 NOTE — Progress Notes (Signed)
Patient states he recently had total knee replacement and has a little pain from that. He denies other concerns at this time.

## 2020-08-22 NOTE — Progress Notes (Signed)
[place]  Telephone:(336) [phone] Fax:(336) [phone]  ID: Cody Ellison Sr. OB: Aug 25, 1951  MR#: [id number]  FFM#:[id number]  Patient Care Team: [name], MD as PCP - General (Internal Medicine)  CHIEF COMPLAINT: Stage IIIb prostate cancer  INTERVAL HISTORY: Patient returns to clinic today for repeat laboratory work, further evaluation, and continuation of Eligard.  He continues to have increased fatigue and hot flashes with Eligard treatment, but otherwise feels well.  He denies any pain.  He has no neurologic complaints.  He denies any recent fevers or illnesses.  He has a good appetite and denies weight loss.  He denies any chest pain, shortness of breath, cough, or hemoptysis.  He denies any nausea, vomiting, constipation, or diarrhea.  He has no urinary complaints.  Patient offers no further specific complaints today.  REVIEW OF SYSTEMS:   Review of Systems  Constitutional: Positive for malaise/fatigue. Negative for fever and weight loss.  Respiratory: Negative.  Negative for cough, hemoptysis and shortness of breath.   Cardiovascular: Negative.  Negative for chest pain and leg swelling.  Gastrointestinal: Negative.  Negative for abdominal pain.  Genitourinary: Negative.  Negative for dysuria and hematuria.  Musculoskeletal: Negative.  Negative for back pain.  Skin: Negative.  Negative for rash.  Neurological: Positive for sensory change. Negative for dizziness, focal weakness, weakness and headaches.  Psychiatric/Behavioral: Negative.  The patient is not nervous/anxious.     As per HPI. Otherwise, a complete review of systems is negative.  PAST MEDICAL HISTORY: Past Medical History:  Diagnosis Date  . Arthritis    lower back   . Cancer [facility])    prostate cancer   . Coronary artery disease    stent RCA- 2000 and 12/2009   . GERD (gastroesophageal reflux disease)   . Hyperlipidemia   . Hypertension   . Skin cancer     PAST SURGICAL  HISTORY: Past Surgical History:  Procedure Laterality Date  . APPENDECTOMY    . COLONOSCOPY WITH PROPOFOL N/A 07/18/2017   Procedure: COLONOSCOPY WITH PROPOFOL;  Surgeon: [name], MD;  Location: [facility] ENDOSCOPY;  Service: Endoscopy;  Laterality: N/A;  . CORONARY ANGIOPLASTY    . coronary stents      x 2  . KNEE ARTHROPLASTY Left 07/20/2017   Procedure: COMPUTER ASSISTED TOTAL KNEE ARTHROPLASTY;  Surgeon: [name], MD;  Location: [facility] ORS;  Service: Orthopedics;  Laterality: Left;  . KNEE ARTHROPLASTY Right 01/23/2020   Procedure: COMPUTER ASSISTED TOTAL KNEE ARTHROPLASTY;  Surgeon: [name], MD;  Location: [facility] ORS;  Service: Orthopedics;  Laterality: Right;  . ROBOT ASSISTED LAPAROSCOPIC RADICAL PROSTATECTOMY N/A 07/16/2013   Procedure: ROBOTIC ASSISTED LAPAROSCOPIC RADICAL PROSTATECTOMY LEVEL 2, bilateral pelvic lymphadenectomy;  Surgeon: [name], MD;  Location: [facility] ORS;  Service: Urology;  Laterality: N/A;  . TONSILLECTOMY     and adenoidectomy     FAMILY HISTORY: Family History  Problem Relation Age of Onset  . Cancer Mother 60       cervical cancer  . Heart attack Father   . Thyroid cancer Daughter 90       doing well    ADVANCED DIRECTIVES (Y/N):  N  HEALTH MAINTENANCE: Social History   Tobacco Use  . Smoking status: Former Smoker    Packs/day: 1.00    Years: 30.00    Pack years: 30.00    Types: Cigarettes    Quit date: 11/01/1992    Years since quitting: 27.8  . Smokeless tobacco: Never Used  Vaping Use  . Vaping Use: Never used  Substance Use Topics  . Alcohol use: No  . Drug use: No     Colonoscopy:  PAP:  Bone density:  Lipid panel:  Allergies  Allergen Reactions  . Ampicillin Other (See Comments)    Raw mouth and throat   . Atorvastatin     Other reaction(s): Unknown  . Carvedilol     Other reaction(s): Dizziness  . Contrast Media [Iodinated Diagnostic Agents] Hives  . Diltiazem Hcl Other (See Comments)  . Fluvastatin  Other (See Comments)  . Lovastatin Other (See Comments)  . Sulfa Antibiotics Hives  . Betadine [Povidone Iodine] Rash    Current Outpatient Medications  Medication Sig Dispense Refill  . acetaminophen (TYLENOL) 500 MG tablet Take 1,000 mg by mouth 2 (two) times daily as needed.     [name] atenolol (TENORMIN) 100 MG tablet Take 100 mg by mouth every morning.    [name] b complex vitamins capsule Take 1 capsule by mouth daily.    . Calcium Carbonate-Vit D-Min (CALTRATE 600+D PLUS PO) Take 1 tablet by mouth daily.     . Coenzyme Q10 (COQ10) 100 MG CAPS Take 100 mg by mouth at bedtime.     . felodipine (PLENDIL) 10 MG 24 hr tablet Take 10 mg by mouth at bedtime.     . folic acid (FOLVITE) 154 MCG tablet Take 800 mcg by mouth at bedtime.     . Inositol Niacinate (NIACIN FLUSH FREE) 500 MG CAPS Take 1,000 mg by mouth in the morning and at bedtime.    [name] Leuprolide Acetate, 6 Month, (LUPRON DEPOT, 42-MONTH,) 45 MG injection Inject 45 mg into the muscle every 6 (six) months.    [name] lisinopril-hydrochlorothiazide (PRINZIDE,ZESTORETIC) 20-12.5 MG per tablet Take 1 tablet by mouth every morning.    [name] omeprazole (PRILOSEC) 20 MG capsule Take 20 mg by mouth daily.    [name] oxyCODONE (OXY IR/ROXICODONE) 5 MG immediate release tablet Take 1 tablet (5 mg total) by mouth every 4 (four) hours as needed for moderate pain (pain score 4-6). 30 tablet 0  . Phenylephrine-APAP-Guaifenesin (MUCINEX SINUS-MAX CONGESTION) 5-325-200 MG TABS Take 2 tablets by mouth daily as needed (congestion).     . traMADol (ULTRAM) 50 MG tablet Take 1 tablet (50 mg total) by mouth every 4 (four) hours as needed for moderate pain. 30 tablet 0  . triamcinolone (NASACORT) 55 MCG/ACT AERO nasal inhaler Place 2 sprays into the nose daily.     . vitamin B-12 (CYANOCOBALAMIN) 1000 MCG tablet Take 1,000 mcg by mouth daily.    . vitamin E 400 UNIT capsule Take 400 Units by mouth daily.    [name] enoxaparin (LOVENOX) 40 MG/0.4ML injection Inject 0.4 mLs (40 mg total)  into the skin daily for 14 days. 5.6 mL 0   No current facility-administered medications for this visit.   Facility-Administered Medications Ordered in Other Visits  Medication Dose Route Frequency Provider Last Rate Last Admin  . Leuprolide Acetate (6 Month) (LUPRON) injection 45 mg  45 mg Intramuscular Once Lloyd Huger, MD        OBJECTIVE: Vitals:   08/27/20 1108  BP: (!) 148/81  Pulse: 62  Resp: 18  Temp: (!) 96.9 F (36.1 C)     Body mass index is 31.16 kg/m.    ECOG FS:0 - Asymptomatic  General: Well-developed, well-nourished, no acute distress. Eyes: Pink conjunctiva, anicteric sclera. HEENT: Normocephalic, moist mucous membranes. Lungs: No audible wheezing or coughing. Heart: Regular  rate and rhythm. Abdomen: Soft, nontender, no obvious distention. Musculoskeletal: No edema, cyanosis, or clubbing. Neuro: Alert, answering all questions appropriately. Cranial nerves grossly intact. Skin: No rashes or petechiae noted. Psych: Normal affect.   LAB RESULTS:  Lab Results  Component Value Date   NA 135 08/25/2020   K 3.8 08/25/2020   CL 105 08/25/2020   CO2 24 08/25/2020   GLUCOSE 142 (H) 08/25/2020   BUN 26 (H) 08/25/2020   CREATININE 1.35 (H) 08/25/2020   CALCIUM 8.7 (L) 08/25/2020   PROT 6.7 01/15/2020   ALBUMIN 4.1 01/15/2020   AST 23 01/15/2020   ALT 32 01/15/2020   ALKPHOS 71 01/15/2020   BILITOT 1.3 (H) 01/15/2020   GFRNONAA 57 (L) 08/25/2020   GFRAA >60 01/15/2020    Lab Results  Component Value Date   WBC 6.7 01/15/2020   HGB 13.6 01/15/2020   HCT 39.9 01/15/2020   MCV 91.7 01/15/2020   PLT 194 01/15/2020     STUDIES: No results found.  ASSESSMENT: Stage IIIb prostate cancer  PLAN:    1.  Stage IIIb prostate cancer: Patient underwent radical prostatectomy on July 06, 2013.  Final pathology reported Gleason 7 (4+3), extracapsular extension, but margins were clear.  Seminal vesicles were not involved and 0 of 8 lymph nodes were  negative for disease.  Patient noted to have a rising PSA and underwent salvage XRT in Spring of 2017.  Nuclear med bone scan on May 30, 2017 revealed improvement of uptake in the right posterior inferior pubic ramus and distal left femoral metadiaphysis.  PET scan on October 31, 2018 revealed no evidence of disease.  Patient reinitiated Lupron/Eligard on November 02, 2018.  Patient's PSA has trended up significantly to 9.9 likely indicating recurrent disease.  We will proceed with Eligard today, but then get repeat PET scan to assess for systemic disease.  Patient will return to clinic 1 to 2 days after his PET scan to discuss the result and likely initiate systemic treatment with Zytiga or Xtandi. 2.  Renal insufficiency: Chronic and unchanged.  I spent a total of 30 minutes reviewing chart data, face-to-face evaluation with the patient, counseling and coordination of care as detailed above.    Patient expressed understanding and was in agreement with this plan. He also understands that He can call clinic at any time with any questions, concerns, or complaints.   Cancer Staging Prostate cancer [facility]) Staging form: Prostate, AJCC 8th Edition - Clinical stage from 07/08/2018: Stage IIIB (cT3a, cN0, cM0, Grade Group: 3) - Signed by Lloyd Huger, MD on 07/08/2018   Lloyd Huger, MD   08/27/2020 2:50 PM

## 2020-08-25 ENCOUNTER — Inpatient Hospital Stay: Payer: Medicare Other | Attending: Oncology

## 2020-08-25 ENCOUNTER — Other Ambulatory Visit: Payer: Self-pay

## 2020-08-25 DIAGNOSIS — Z5111 Encounter for antineoplastic chemotherapy: Secondary | ICD-10-CM | POA: Insufficient documentation

## 2020-08-25 DIAGNOSIS — C61 Malignant neoplasm of prostate: Secondary | ICD-10-CM

## 2020-08-25 LAB — BASIC METABOLIC PANEL
Anion gap: 6 (ref 5–15)
BUN: 26 mg/dL — ABNORMAL HIGH (ref 8–23)
CO2: 24 mmol/L (ref 22–32)
Calcium: 8.7 mg/dL — ABNORMAL LOW (ref 8.9–10.3)
Chloride: 105 mmol/L (ref 98–111)
Creatinine, Ser: 1.35 mg/dL — ABNORMAL HIGH (ref 0.61–1.24)
GFR, Estimated: 57 mL/min — ABNORMAL LOW (ref >60)
Glucose, Bld: 142 mg/dL — ABNORMAL HIGH (ref 70–99)
Potassium: 3.8 mmol/L (ref 3.5–5.1)
Sodium: 135 mmol/L (ref 135–145)

## 2020-08-25 LAB — PSA: Prostatic Specific Antigen: 9.9 ng/mL — ABNORMAL HIGH (ref 0.00–4.00)

## 2020-08-27 ENCOUNTER — Other Ambulatory Visit: Payer: Self-pay

## 2020-08-27 ENCOUNTER — Inpatient Hospital Stay (HOSPITAL_BASED_OUTPATIENT_CLINIC_OR_DEPARTMENT_OTHER): Payer: Medicare Other | Admitting: Oncology

## 2020-08-27 ENCOUNTER — Inpatient Hospital Stay: Payer: Medicare Other

## 2020-08-27 ENCOUNTER — Encounter: Payer: Self-pay | Admitting: Oncology

## 2020-08-27 VITALS — BP 148/81 | HR 62 | Temp 96.9°F | Resp 18 | Wt 211.0 lb

## 2020-08-27 DIAGNOSIS — C61 Malignant neoplasm of prostate: Secondary | ICD-10-CM

## 2020-08-27 DIAGNOSIS — Z5111 Encounter for antineoplastic chemotherapy: Secondary | ICD-10-CM | POA: Diagnosis not present

## 2020-08-27 MED ORDER — LEUPROLIDE ACETATE (6 MONTH) 45 MG ~~LOC~~ KIT
45.0000 mg | PACK | Freq: Once | SUBCUTANEOUS | Status: AC
  Administered 2020-08-27: 45 mg via SUBCUTANEOUS
  Filled 2020-08-27: qty 45

## 2020-08-27 NOTE — Progress Notes (Signed)
Patient denies any concerns today.  

## 2020-08-30 NOTE — Progress Notes (Signed)
[place]  Telephone:(336) [phone] Fax:(336) [phone]  ID: Cody Spry Sr. OB: 08-11-1951  MR#: [id number]  YOV#:[id number]  Patient Care Team: [name], MD as PCP - General (Internal Medicine)  CHIEF COMPLAINT: Stage IIIb prostate cancer  INTERVAL HISTORY: Patient returns to clinic today for further evaluation and discussion of his PET scan results.  He continues to feel well.  He reports fatigue and hot flashes with his Eligard treatment.  He denies any pain.  He has no neurologic complaints.  He denies any recent fevers or illnesses.  He has a good appetite and denies weight loss.  He denies any chest pain, shortness of breath, cough, or hemoptysis.  He denies any nausea, vomiting, constipation, or diarrhea.  He has no urinary complaints.  Patient offers no further specific complaints today.  REVIEW OF SYSTEMS:   Review of Systems  Constitutional: Positive for malaise/fatigue. Negative for fever and weight loss.  Respiratory: Negative.  Negative for cough, hemoptysis and shortness of breath.   Cardiovascular: Negative.  Negative for chest pain and leg swelling.  Gastrointestinal: Negative.  Negative for abdominal pain.  Genitourinary: Negative.  Negative for dysuria and hematuria.  Musculoskeletal: Negative.  Negative for back pain.  Skin: Negative.  Negative for rash.  Neurological: Positive for sensory change. Negative for dizziness, focal weakness, weakness and headaches.  Psychiatric/Behavioral: Negative.  The patient is not nervous/anxious.     As per HPI. Otherwise, a complete review of systems is negative.  PAST MEDICAL HISTORY: Past Medical History:  Diagnosis Date  . Arthritis    lower back   . Cancer [facility])    prostate cancer   . Coronary artery disease    stent RCA- 2000 and 12/2009   . GERD (gastroesophageal reflux disease)   . Hyperlipidemia   . Hypertension   . Skin cancer     PAST SURGICAL HISTORY: Past Surgical History:   Procedure Laterality Date  . APPENDECTOMY    . COLONOSCOPY WITH PROPOFOL N/A 07/18/2017   Procedure: COLONOSCOPY WITH PROPOFOL;  Surgeon: [name], MD;  Location: [facility] ENDOSCOPY;  Service: Endoscopy;  Laterality: N/A;  . CORONARY ANGIOPLASTY    . coronary stents      x 2  . KNEE ARTHROPLASTY Left 07/20/2017   Procedure: COMPUTER ASSISTED TOTAL KNEE ARTHROPLASTY;  Surgeon: [name], MD;  Location: [facility] ORS;  Service: Orthopedics;  Laterality: Left;  . KNEE ARTHROPLASTY Right 01/23/2020   Procedure: COMPUTER ASSISTED TOTAL KNEE ARTHROPLASTY;  Surgeon: [name], MD;  Location: [facility] ORS;  Service: Orthopedics;  Laterality: Right;  . ROBOT ASSISTED LAPAROSCOPIC RADICAL PROSTATECTOMY N/A 07/16/2013   Procedure: ROBOTIC ASSISTED LAPAROSCOPIC RADICAL PROSTATECTOMY LEVEL 2, bilateral pelvic lymphadenectomy;  Surgeon: [name], MD;  Location: [facility] ORS;  Service: Urology;  Laterality: N/A;  . TONSILLECTOMY     and adenoidectomy     FAMILY HISTORY: Family History  Problem Relation Age of Onset  . Cancer Mother 66       cervical cancer  . Heart attack Father   . Thyroid cancer Daughter 69       doing well    ADVANCED DIRECTIVES (Y/N):  N  HEALTH MAINTENANCE: Social History   Tobacco Use  . Smoking status: Former Smoker    Packs/day: 1.00    Years: 30.00    Pack years: 30.00    Types: Cigarettes    Quit date: 11/01/1992    Years since quitting: 27.8  . Smokeless tobacco: Never Used  Vaping Use  . Vaping Use: Never used  Substance Use Topics  . Alcohol use: No  . Drug use: No     Colonoscopy:  PAP:  Bone density:  Lipid panel:  Allergies  Allergen Reactions  . Ampicillin Other (See Comments)    Raw mouth and throat   . Atorvastatin     Other reaction(s): Unknown  . Carvedilol     Other reaction(s): Dizziness  . Contrast Media [Iodinated Diagnostic Agents] Hives  . Diltiazem Hcl Other (See Comments)  . Fluvastatin Other (See Comments)  . Lovastatin  Other (See Comments)  . Sulfa Antibiotics Hives  . Betadine [Povidone Iodine] Rash    Current Outpatient Medications  Medication Sig Dispense Refill  . acetaminophen (TYLENOL) 500 MG tablet Take 1,000 mg by mouth 2 (two) times daily as needed.     [name] atenolol (TENORMIN) 100 MG tablet Take 100 mg by mouth every morning.    [name] b complex vitamins capsule Take 1 capsule by mouth daily.    . Calcium Carbonate-Vit D-Min (CALTRATE 600+D PLUS PO) Take 1 tablet by mouth daily.     . Coenzyme Q10 (COQ10) 100 MG CAPS Take 100 mg by mouth at bedtime.     . felodipine (PLENDIL) 10 MG 24 hr tablet Take 10 mg by mouth at bedtime.     . folic acid (FOLVITE) 937 MCG tablet Take 800 mcg by mouth at bedtime.     . Inositol Niacinate (NIACIN FLUSH FREE) 500 MG CAPS Take 1,000 mg by mouth in the morning and at bedtime.    [name] Leuprolide Acetate, 6 Month, (LUPRON DEPOT, 36-MONTH,) 45 MG injection Inject 45 mg into the muscle every 6 (six) months.    [name] lisinopril-hydrochlorothiazide (PRINZIDE,ZESTORETIC) 20-12.5 MG per tablet Take 1 tablet by mouth every morning.    [name] omeprazole (PRILOSEC) 20 MG capsule Take 20 mg by mouth daily.    . rosuvastatin (CRESTOR) 10 MG tablet Take 10 mg by mouth daily.    [name] triamcinolone (NASACORT) 55 MCG/ACT AERO nasal inhaler Place 2 sprays into the nose daily.     . vitamin B-12 (CYANOCOBALAMIN) 1000 MCG tablet Take 1,000 mcg by mouth daily.    . vitamin E 400 UNIT capsule Take 400 Units by mouth daily.    [name] enoxaparin (LOVENOX) 40 MG/0.4ML injection Inject 0.4 mLs (40 mg total) into the skin daily for 14 days. 5.6 mL 0   No current facility-administered medications for this visit.   Facility-Administered Medications Ordered in Other Visits  Medication Dose Route Frequency Provider Last Rate Last Admin  . Leuprolide Acetate (6 Month) (LUPRON) injection 45 mg  45 mg Intramuscular Once Lloyd Huger, MD        OBJECTIVE: Vitals:   09/05/20 1026  BP: 137/77  Pulse: 66  Resp:  20  Temp: (!) 97.2 F (36.2 C)  SpO2: 97%     Body mass index is 31.07 kg/m.    ECOG FS:0 - Asymptomatic  General: Well-developed, well-nourished, no acute distress. Eyes: Pink conjunctiva, anicteric sclera. HEENT: Normocephalic, moist mucous membranes. Lungs: No audible wheezing or coughing. Heart: Regular rate and rhythm. Abdomen: Soft, nontender, no obvious distention. Musculoskeletal: No edema, cyanosis, or clubbing. Neuro: Alert, answering all questions appropriately. Cranial nerves grossly intact. Skin: No rashes or petechiae noted. Psych: Normal affect.   LAB RESULTS:  Lab Results  Component Value Date   NA 135 08/25/2020   K 3.8 08/25/2020   CL 105 08/25/2020   CO2 24  08/25/2020   GLUCOSE 142 (H) 08/25/2020   BUN 26 (H) 08/25/2020   CREATININE 1.35 (H) 08/25/2020   CALCIUM 8.7 (L) 08/25/2020   PROT 6.7 01/15/2020   ALBUMIN 4.1 01/15/2020   AST 23 01/15/2020   ALT 32 01/15/2020   ALKPHOS 71 01/15/2020   BILITOT 1.3 (H) 01/15/2020   GFRNONAA 57 (L) 08/25/2020   GFRAA >60 01/15/2020    Lab Results  Component Value Date   WBC 6.7 01/15/2020   HGB 13.6 01/15/2020   HCT 39.9 01/15/2020   MCV 91.7 01/15/2020   PLT 194 01/15/2020     STUDIES: NM PET (F18-PYLARIFY) SKULL TO MID THIGH  Result Date: 09/05/2020 CLINICAL DATA:  Prostate carcinoma with biochemical recurrence. EXAM: NUCLEAR MEDICINE PET SKULL BASE TO THIGH TECHNIQUE: 9.3 mCi F-18 Fluciclovine was injected intravenously. Full-ring PET imaging was performed from the skull base to thigh after the radiotracer. CT data was obtained and used for attenuation correction and anatomic localization. COMPARISON:  None. FINDINGS: NECK No radiotracer activity in neck lymph nodes. Incidental CT finding: None CHEST No radiotracer accumulation within mediastinal or hilar lymph nodes. No suspicious pulmonary nodules on the CT scan. Incidental CT finding: Coronary artery calcification and aortic atherosclerotic  calcification. ABDOMEN/PELVIS Prostate: No focal activity in the prostatectomy bed. Lymph nodes: No abnormal radiotracer accumulation within pelvic or abdominal nodes. Liver: No evidence of liver metastasis Incidental CT finding: Sludge within the gallbladder. Atherosclerotic calcification of the aorta. SKELETON No focal activity to suggest skeletal metastasis. No sclerotic or lytic lesions. IMPRESSION: 1. No evidence of local prostate cancer recurrence in the prostatectomy bed 2. No evidence of prostate cancer nodal metastasis in the pelvis or retroperitoneum. 3. No evidence of visceral metastasis or skeletal metastasis. Electronically Signed   By: Suzy Bouchard M.D.   On: 09/05/2020 09:24    ONCOLOGY TREATMENT HISTORY: Patient underwent radical prostatectomy on July 06, 2013.  Final pathology reported Gleason 7 (4+3), extracapsular extension, but margins were clear.  Seminal vesicles were not involved and 0 of 8 lymph nodes were negative for disease.  Patient noted to have a rising PSA and underwent salvage XRT in Spring of 2017.  Nuclear med bone scan on May 30, 2017 revealed improvement of uptake in the right posterior inferior pubic ramus and distal left femoral metadiaphysis.  PET scan on October 31, 2018 revealed no evidence of disease.  Patient reinitiated Lupron/Eligard on November 02, 2018.    ASSESSMENT: Stage IIIb prostate cancer  PLAN:    1.  Stage IIIb prostate cancer:  Patient's PSA has trended up significantly to 9.9 likely indicating recurrent disease.  Patient received Eligard last week.  Repeat F18-PYLARIFY PET scan on September 05, 2020 revealed no evidence of distant disease.  After lengthy discussion with the patient it was decided upon that no intervention is needed at this time and will continue simple observation until there is evidence of systemic disease.  At which point can initiate treatment with Zytiga or Xtandi.  Return to clinic in 3 months for laboratory work only and  then in 6 months with repeat imaging, further evaluation, and continuation of Eligard.   2.  Renal insufficiency: Chronic and unchanged.  Patient's creatinine is 1.35.   I spent a total of 30 minutes reviewing chart data, face-to-face evaluation with the patient, counseling and coordination of care as detailed above.   Patient expressed understanding and was in agreement with this plan. He also understands that He can call clinic at any time with  any questions, concerns, or complaints.   Cancer Staging Prostate cancer [facility]) Staging form: Prostate, AJCC 8th Edition - Clinical stage from 07/08/2018: Stage IIIB (cT3a, cN0, cM0, Grade Group: 3) - Signed by Lloyd Huger, MD on 07/08/2018   Lloyd Huger, MD   09/06/2020 10:28 AM

## 2020-09-04 ENCOUNTER — Ambulatory Visit
Admission: RE | Admit: 2020-09-04 | Discharge: 2020-09-04 | Disposition: A | Discharge status: Home / Self Care | Payer: Medicare Other | Source: Ambulatory Visit | Attending: Radiation Oncology | Admitting: Radiation Oncology

## 2020-09-04 ENCOUNTER — Encounter
Admission: RE | Admit: 2020-09-04 | Discharge: 2020-09-04 | Disposition: A | Discharge status: Home / Self Care | Payer: Medicare Other | Source: Ambulatory Visit | Attending: Oncology | Admitting: Oncology

## 2020-09-04 ENCOUNTER — Other Ambulatory Visit: Payer: Self-pay

## 2020-09-04 VITALS — BP 154/77 | HR 63 | Temp 95.8°F | Resp 16 | Wt 210.9 lb

## 2020-09-04 DIAGNOSIS — R9721 Rising PSA following treatment for malignant neoplasm of prostate: Secondary | ICD-10-CM | POA: Insufficient documentation

## 2020-09-04 DIAGNOSIS — N2889 Other specified disorders of kidney and ureter: Secondary | ICD-10-CM | POA: Insufficient documentation

## 2020-09-04 DIAGNOSIS — C61 Malignant neoplasm of prostate: Secondary | ICD-10-CM

## 2020-09-04 DIAGNOSIS — Z923 Personal history of irradiation: Secondary | ICD-10-CM | POA: Insufficient documentation

## 2020-09-04 IMAGING — CT NM PET TUM IMG SKULL BASE T - THIGH
1 of 9 series · 1 of 25 positions shown · non-contrast
Comparison: None.

CLINICAL DATA: Prostate carcinoma with biochemical recurrence.

EXAM:
NUCLEAR MEDICINE PET SKULL BASE TO THIGH
TECHNIQUE: 9.3 mCi F-18 Fluciclovine was injected intravenously. Full-ring PET
imaging was performed from the skull base to thigh after the
radiotracer. CT data was obtained and used for attenuation
correction and anatomic localization.

[Series 3: ct wb 5.0 b30f · axial · 5.0mm · 0.98mm/px · 1 of 329 slices shown]
[im 329/329  brain]
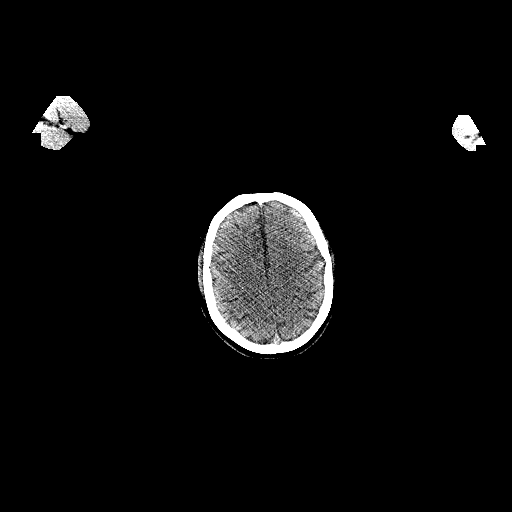

[1 of 25 positions shown; findings below may reference images not displayed]

FINDINGS: NECK

No radiotracer activity in neck lymph nodes.

Incidental CT finding: None

CHEST

No radiotracer accumulation within mediastinal or hilar lymph nodes.
No suspicious pulmonary nodules on the CT scan.

Incidental CT finding: Coronary artery calcification and aortic
atherosclerotic calcification.

ABDOMEN/PELVIS

Prostate: No focal activity in the prostatectomy bed.

Lymph nodes: No abnormal radiotracer accumulation within pelvic or
abdominal nodes.

Liver: No evidence of liver metastasis

Incidental CT finding: Sludge within the gallbladder.
Atherosclerotic calcification of the aorta.

SKELETON

No focal activity to suggest skeletal metastasis. No sclerotic or
lytic lesions.
IMPRESSION: 1. No evidence of local prostate cancer recurrence in the
prostatectomy bed
2. No evidence of prostate cancer nodal metastasis in the pelvis or
retroperitoneum.
3. No evidence of visceral metastasis or skeletal metastasis.

## 2020-09-04 MED ORDER — PIFLIFOLASTAT F 18 (PYLARIFY) INJECTION
9.0000 | Freq: Once | INTRAVENOUS | Status: AC
  Administered 2020-09-04: 9.276 via INTRAVENOUS

## 2020-09-04 NOTE — Progress Notes (Signed)
Radiation Oncology Follow up Note  Name: Cody JOYNT Sr.   Date:   09/04/2020 MRN:  [id number] DOB: [birth year]    This 69 y.o. male presents to the clinic today for 4-1/2-year follow-up status post salvage radiation therapy in patient status post prostatectomy for Gleason 7 adenocarcinoma.  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 He has a PET scan scheduled for today to assess possibility of stage IV disease with his most recent PSA climbing to 9.9. Patient also has a history of renal insufficiency. He specifically denies any increased lower urinary tract symptoms diarrhea or bone pain.  COMPLICATIONS OF TREATMENT: none  FOLLOW UP COMPLIANCE: keeps appointments   PHYSICAL EXAM:  BP (!) 154/77 (BP Location: Left Arm, Patient Position: Sitting)    Pulse 63    Temp (!) 95.8 F (35.4 C) (Tympanic)    Resp 16    Wt 210 lb 14.4 oz (95.7 kg)    BMI 31.14 kg/m  Well-developed well-nourished patient in NAD. HEENT reveals PERLA, EOMI, discs not visualized.  Oral cavity is clear. No oral mucosal lesions are identified. Neck is clear without evidence of cervical or supraclavicular adenopathy. Lungs are clear to A&P. Cardiac examination is essentially unremarkable with regular rate and rhythm without murmur rub or thrill. Abdomen is benign with no organomegaly or masses noted. Motor sensory and DTR levels are equal and symmetric in the upper and lower extremities. Cranial nerves II through XII are grossly intact. Proprioception is intact. No peripheral adenopathy or edema is identified. No motor or sensory levels are noted. Crude visual fields are within normal range.  RADIOLOGY RESULTS: PET CT scan to be reviewed after  completion today  PLAN: Present time patient will have a PET CT scan to determine possibility of any systemic disease. I believe medical oncology is considering Zytiga or Xtandi. I will see him back in 3 to 4 months should he have any areas that need palliative radiation therapy would see him back immediately. Patient knows to call with any concerns.  I would like to take this opportunity to thank you for allowing me to participate in the care of your patient.Noreene Filbert, MD

## 2020-09-05 ENCOUNTER — Encounter: Payer: Self-pay | Admitting: Oncology

## 2020-09-05 ENCOUNTER — Inpatient Hospital Stay: Payer: Medicare Other | Attending: Oncology | Admitting: Oncology

## 2020-09-05 VITALS — BP 137/77 | HR 66 | Temp 97.2°F | Resp 20 | Ht 69.0 in | Wt 210.4 lb

## 2020-09-05 DIAGNOSIS — C61 Malignant neoplasm of prostate: Secondary | ICD-10-CM

## 2020-09-05 DIAGNOSIS — Z87891 Personal history of nicotine dependence: Secondary | ICD-10-CM | POA: Diagnosis not present

## 2020-09-05 DIAGNOSIS — Z9079 Acquired absence of other genital organ(s): Secondary | ICD-10-CM | POA: Insufficient documentation

## 2020-09-05 DIAGNOSIS — N189 Chronic kidney disease, unspecified: Secondary | ICD-10-CM | POA: Diagnosis not present

## 2020-09-05 DIAGNOSIS — Z808 Family history of malignant neoplasm of other organs or systems: Secondary | ICD-10-CM | POA: Diagnosis not present

## 2020-09-05 DIAGNOSIS — Z96653 Presence of artificial knee joint, bilateral: Secondary | ICD-10-CM | POA: Insufficient documentation

## 2020-12-08 ENCOUNTER — Inpatient Hospital Stay: Payer: Medicare Other | Attending: Oncology

## 2020-12-08 DIAGNOSIS — C61 Malignant neoplasm of prostate: Secondary | ICD-10-CM | POA: Diagnosis not present

## 2020-12-08 LAB — BASIC METABOLIC PANEL
Anion gap: 8 (ref 5–15)
BUN: 24 mg/dL — ABNORMAL HIGH (ref 8–23)
CO2: 24 mmol/L (ref 22–32)
Calcium: 9.3 mg/dL (ref 8.9–10.3)
Chloride: 104 mmol/L (ref 98–111)
Creatinine, Ser: 1.35 mg/dL — ABNORMAL HIGH (ref 0.61–1.24)
GFR, Estimated: 57 mL/min — ABNORMAL LOW (ref >60)
Glucose, Bld: 131 mg/dL — ABNORMAL HIGH (ref 70–99)
Potassium: 4.2 mmol/L (ref 3.5–5.1)
Sodium: 136 mmol/L (ref 135–145)

## 2020-12-08 LAB — PSA: Prostatic Specific Antigen: 21.43 ng/mL — ABNORMAL HIGH (ref 0.00–4.00)

## 2020-12-09 LAB — TESTOSTERONE: Testosterone: 7 ng/dL — ABNORMAL LOW (ref 264–916)

## 2021-01-07 ENCOUNTER — Encounter: Payer: Self-pay | Admitting: Radiation Oncology

## 2021-01-07 ENCOUNTER — Ambulatory Visit
Admission: RE | Admit: 2021-01-07 | Discharge: 2021-01-07 | Disposition: A | Discharge status: Home / Self Care | Payer: Medicare Other | Source: Ambulatory Visit | Attending: Radiation Oncology | Admitting: Radiation Oncology

## 2021-01-07 VITALS — BP 110/76 | HR 70 | Temp 97.2°F | Wt 211.0 lb

## 2021-01-07 DIAGNOSIS — Z192 Hormone resistant malignancy status: Secondary | ICD-10-CM | POA: Diagnosis not present

## 2021-01-07 DIAGNOSIS — C61 Malignant neoplasm of prostate: Secondary | ICD-10-CM | POA: Insufficient documentation

## 2021-01-07 DIAGNOSIS — Z923 Personal history of irradiation: Secondary | ICD-10-CM | POA: Insufficient documentation

## 2021-01-07 NOTE — Progress Notes (Signed)
Radiation Oncology Follow up Note  Name: Cody DUFFUS Sr.   Date:   01/07/2021 MRN:  [id number] DOB: Feb 22, 1951    This 70 y.o. male presents to the clinic today for 5-year follow-up status post salvage radiation therapy and patient status post proximal septectomy for Gleason 7 adenocarcinoma the prostate.  REFERRING PROVIDER: [name], MD  HPI: Patient is a 70 year old male now out 5 years having completed salvage radiation therapy for Gleason 7 adenocarcinoma the prostate status post robotic assisted prostatectomy.  He continues to be asymptomatic.  He has had a recent PET CT scan showing no evidence of stage IV disease.[name]  Unfortunately his PSA continues to climb from 9.94 months ago to recently 21.4.  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  COMPLICATIONS OF TREATMENT: none  FOLLOW UP COMPLIANCE: keeps appointments   PHYSICAL EXAM:  BP 110/76   Pulse 70   Temp (!) 97.2 F (36.2 C) (Tympanic)   Wt 211 lb (95.7 kg)   BMI 31.16 kg/m  Well-developed well-nourished patient in NAD. HEENT reveals PERLA, EOMI, discs not visualized.  Oral cavity is clear. No oral mucosal lesions are identified. Neck is clear without evidence of cervical or supraclavicular adenopathy. Lungs are clear to A&P. Cardiac examination is essentially unremarkable with regular rate and rhythm without murmur rub or thrill. Abdomen is benign with no organomegaly or masses noted. Motor sensory and DTR levels are equal and symmetric in the upper and lower extremities. Cranial nerves II through XII are grossly intact. Proprioception is intact. No peripheral adenopathy or edema is identified. No motor or sensory levels are noted. Crude visual fields are within normal range.  RADIOLOGY RESULTS: PET CT scan reviewed compatible with above-stated findings showing no evidence of metastatic disease  PLAN: At this time of asked Dr. 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  enzalutamide [36,37], apalutamide [38,39], and darolutamide have all been shown in randomized placebo controlled trials in nonmetastatic castrate resistant prostate cancer to be of benefit.  I am referring back to Dr. Grayland Ormond for possible contemplation of moving towards these therapies.  Patient comprehends my recommendations.  I will see him back in 6 months for follow-up.  Patient knows to call with any concerns.  I would like to take this opportunity to thank you for allowing me to participate in the care of your patient.Noreene Filbert, MD

## 2021-01-16 NOTE — Progress Notes (Signed)
[name]  [phone] Fax:(336) [phone]  ID: Cody Ellison Sr. OB: [birth year]  MR#: [id number]  OFB#:[id number]  Patient Care Team: Cody Aus, MD as PCP - General (Internal Medicine)  CHIEF COMPLAINT: Stage IIIb prostate cancer  INTERVAL HISTORY: Patient returns to clinic today as an add-on for further evaluation, discussion of his laboratory results, and whether or not to initiate treatment.  He continues to feel well and remains asymptomatic.  He denies any pain. He reports fatigue and hot flashes with his Eligard treatment.  He denies any pain.  He has no neurologic complaints.  He denies any recent fevers or illnesses.  He has a good appetite and denies weight loss.  He denies any chest pain, shortness of breath, cough, or hemoptysis.  He denies any nausea, vomiting, constipation, or diarrhea.  He has no urinary complaints.  Patient offers no further specific complaints today.  REVIEW OF SYSTEMS:   Review of Systems  Constitutional: Positive for malaise/fatigue. Negative for fever and weight loss.  Respiratory: Negative.  Negative for cough, hemoptysis and shortness of breath.   Cardiovascular: Negative.  Negative for chest pain and leg swelling.  Gastrointestinal: Negative.  Negative for abdominal pain.  Genitourinary: Negative.  Negative for dysuria and hematuria.  Musculoskeletal: Negative.  Negative for back pain.  Skin: Negative.  Negative for rash.  Neurological: Positive for sensory change. Negative for dizziness, focal weakness, weakness and headaches.  Psychiatric/Behavioral: Negative.  The patient is not nervous/anxious.     As per HPI. Otherwise, a complete review of systems is negative.  PAST MEDICAL HISTORY: Past Medical History:  Diagnosis Date  . Arthritis    lower back   . Cancer [facility])    prostate cancer   . Coronary artery disease    stent RCA- 2000 and 12/2009   . GERD (gastroesophageal reflux disease)   .  Hyperlipidemia   . Hypertension   . Skin cancer     PAST SURGICAL HISTORY: Past Surgical History:  Procedure Laterality Date  . APPENDECTOMY    . COLONOSCOPY WITH PROPOFOL N/A 07/18/2017   Procedure: COLONOSCOPY WITH PROPOFOL;  Surgeon: Cody Sails, MD;  Location: [facility] ENDOSCOPY;  Service: Endoscopy;  Laterality: N/A;  . CORONARY ANGIOPLASTY    . coronary stents      x 2  . KNEE ARTHROPLASTY Left 07/20/2017   Procedure: COMPUTER ASSISTED TOTAL KNEE ARTHROPLASTY;  Surgeon: Cody Leep, MD;  Location: [facility] ORS;  Service: Orthopedics;  Laterality: Left;  . KNEE ARTHROPLASTY Right 01/23/2020   Procedure: COMPUTER ASSISTED TOTAL KNEE ARTHROPLASTY;  Surgeon: Cody Leep, MD;  Location: [facility] ORS;  Service: Orthopedics;  Laterality: Right;  . ROBOT ASSISTED LAPAROSCOPIC RADICAL PROSTATECTOMY N/A 07/16/2013   Procedure: ROBOTIC ASSISTED LAPAROSCOPIC RADICAL PROSTATECTOMY LEVEL 2, bilateral pelvic lymphadenectomy;  Surgeon: Cody Gray, MD;  Location: [facility] ORS;  Service: Urology;  Laterality: N/A;  . TONSILLECTOMY     and adenoidectomy     FAMILY HISTORY: Family History  Problem Relation Age of Onset  . Cancer Mother 95       cervical cancer  . Heart attack Father   . Thyroid cancer Daughter 57       doing well    ADVANCED DIRECTIVES (Y/N):  N  HEALTH MAINTENANCE: Social History   Tobacco Use  . Smoking status: Former Smoker    Packs/day: 1.00    Years: 30.00    Pack years: 30.00    Types: Cigarettes    Quit  date: 11/01/1992    Years since quitting: 28.2  . Smokeless tobacco: Never Used  Vaping Use  . Vaping Use: Never used  Substance Use Topics  . Alcohol use: No  . Drug use: No     Colonoscopy:  PAP:  Bone density:  Lipid panel:  Allergies  Allergen Reactions  . Ampicillin Other (See Comments)    Raw mouth and throat   . Atorvastatin     Other reaction(s): Unknown  . Carvedilol     Other reaction(s): Dizziness  . Contrast Media [Iodinated Diagnostic  Agents] Hives  . Diltiazem Hcl Other (See Comments)  . Fluvastatin Other (See Comments)  . Lovastatin Other (See Comments)  . Sulfa Antibiotics Hives  . Betadine [Povidone Iodine] Rash    Current Outpatient Medications  Medication Sig Dispense Refill  . acetaminophen (TYLENOL) 500 MG tablet Take 1,000 mg by mouth 2 (two) times daily as needed.     [name] atenolol (TENORMIN) 100 MG tablet Take 100 mg by mouth every morning.    [name] b complex vitamins capsule Take 1 capsule by mouth daily.    . Calcium Carbonate-Vit D-Min (CALTRATE 600+D PLUS PO) Take 1 tablet by mouth daily.     . Coenzyme Q10 (COQ10) 100 MG CAPS Take 100 mg by mouth at bedtime.     . felodipine (PLENDIL) 10 MG 24 hr tablet Take 10 mg by mouth at bedtime.     . folic acid (FOLVITE) 389 MCG tablet Take 800 mcg by mouth at bedtime.     . Inositol Niacinate (NIACIN FLUSH FREE) 500 MG CAPS Take 1,000 mg by mouth in the morning and at bedtime.    [name] Leuprolide Acetate, 6 Month, (LUPRON DEPOT, 27-MONTH,) 45 MG injection Inject 45 mg into the muscle every 6 (six) months.    [name] lisinopril-hydrochlorothiazide (PRINZIDE,ZESTORETIC) 20-12.5 MG per tablet Take 1 tablet by mouth every morning.    [name] omeprazole (PRILOSEC) 20 MG capsule Take 20 mg by mouth daily.    . rosuvastatin (CRESTOR) 10 MG tablet Take 10 mg by mouth daily.    [name] triamcinolone (NASACORT) 55 MCG/ACT AERO nasal inhaler Place 2 sprays into the nose daily.     . vitamin B-12 (CYANOCOBALAMIN) 1000 MCG tablet Take 1,000 mcg by mouth daily.    . vitamin E 400 UNIT capsule Take 400 Units by mouth daily.     No current facility-administered medications for this visit.   Facility-Administered Medications Ordered in Other Visits  Medication Dose Route Frequency Provider Last Rate Last Admin  . Leuprolide Acetate (6 Month) (LUPRON) injection 45 mg  45 mg Intramuscular Once Cody Huger, MD        OBJECTIVE: Vitals:   01/22/21 1016  BP: 106/73  Pulse: 80  Resp: 16  Temp:  98.2 F (36.8 C)     Body mass index is 31.25 kg/m.    ECOG FS:0 - Asymptomatic  General: Well-developed, well-nourished, no acute distress. Eyes: Pink conjunctiva, anicteric sclera. HEENT: Normocephalic, moist mucous membranes. Lungs: No audible wheezing or coughing. Heart: Regular rate and rhythm. Abdomen: Soft, nontender, no obvious distention. Musculoskeletal: No edema, cyanosis, or clubbing. Neuro: Alert, answering all questions appropriately. Cranial nerves grossly intact. Skin: No rashes or petechiae noted. Psych: Normal affect.   LAB RESULTS:  Lab Results  Component Value Date   NA 136 12/08/2020   K 4.2 12/08/2020   CL 104 12/08/2020   CO2 24 12/08/2020   GLUCOSE 131 (H) 12/08/2020   BUN 24 (  H) 12/08/2020   CREATININE 1.35 (H) 12/08/2020   CALCIUM 9.3 12/08/2020   PROT 6.7 01/15/2020   ALBUMIN 4.1 01/15/2020   AST 23 01/15/2020   ALT 32 01/15/2020   ALKPHOS 71 01/15/2020   BILITOT 1.3 (H) 01/15/2020   GFRNONAA 57 (L) 12/08/2020   GFRAA >60 01/15/2020    Lab Results  Component Value Date   WBC 6.7 01/15/2020   HGB 13.6 01/15/2020   HCT 39.9 01/15/2020   MCV 91.7 01/15/2020   PLT 194 01/15/2020     STUDIES: No results found.  ONCOLOGY TREATMENT HISTORY: Patient underwent radical prostatectomy on July 06, 2013.  Final pathology reported Gleason 7 (4+3), extracapsular extension, but margins were clear.  Seminal vesicles were not involved and 0 of 8 lymph nodes were negative for disease.  Patient noted to have a rising PSA and underwent salvage XRT in Spring of 2017.  Nuclear med bone scan on May 30, 2017 revealed improvement of uptake in the right posterior inferior pubic ramus and distal left femoral metadiaphysis.  PET scan on October 31, 2018 revealed no evidence of disease.  Patient reinitiated Lupron/Eligard on November 02, 2018.    ASSESSMENT: Stage IIIb prostate cancer  PLAN:    1.  Stage IIIb prostate cancer: Patient's PSA continues to trend  up and is now 21.43.  Despite this, F18-PYLARIFY PET scan on September 04, 2020 revealed no evidence of distant disease.  Patient last received Eligard on August 27, 2020, but it appears he is now castrate resistant.  He does not wish to initiate treatment with Zytiga or Xtandi at this time.  Rather, he wishes to proceed with repeat PET scan on Mar 03, 2021 with follow-up 1 to 2 days later and discuss the results and treatment options.   2.  Renal insufficiency: Chronic and unchanged.  Patient's most recent creatinine was 1.35.   I spent a total of 30 minutes reviewing chart data, face-to-face evaluation with the patient, counseling and coordination of care as detailed above.   Patient expressed understanding and was in agreement with this plan. He also understands that He can call clinic at any time with any questions, concerns, or complaints.   Cancer Staging Prostate cancer [facility]) Staging form: Prostate, AJCC 8th Edition - Clinical stage from 07/08/2018: Stage IIIB (cT3a, cN0, cM0, Grade Group: 3) - Signed by Cody Huger, MD on 07/08/2018 Gleason score: 7 Histologic grading system: 5 grade system   Cody Huger, MD   01/22/2021 2:12 PM

## 2021-01-22 ENCOUNTER — Encounter: Payer: Self-pay | Admitting: Oncology

## 2021-01-22 ENCOUNTER — Inpatient Hospital Stay: Payer: Medicare Other | Attending: Oncology | Admitting: Oncology

## 2021-01-22 VITALS — BP 106/73 | HR 80 | Temp 98.2°F | Resp 16 | Wt 211.6 lb

## 2021-01-22 DIAGNOSIS — N189 Chronic kidney disease, unspecified: Secondary | ICD-10-CM | POA: Diagnosis not present

## 2021-01-22 DIAGNOSIS — Z87891 Personal history of nicotine dependence: Secondary | ICD-10-CM | POA: Diagnosis not present

## 2021-01-22 DIAGNOSIS — C61 Malignant neoplasm of prostate: Secondary | ICD-10-CM | POA: Diagnosis not present

## 2021-01-22 NOTE — Progress Notes (Signed)
Patient here for follow up no complaints today. 

## 2021-02-28 NOTE — Progress Notes (Deleted)
[place]  Telephone:(336) [phone] Fax:(336) [phone]  ID: Cody Spry Sr. OB: 03-Sep-1951  MR#: [id number]  QIH#:[id number]  Patient Care Team: [name], MD as PCP - General (Internal Medicine)  CHIEF COMPLAINT: Stage IIIb prostate cancer  INTERVAL HISTORY: Patient returns to clinic today as an add-on for further evaluation, discussion of his laboratory results, and whether or not to initiate treatment.  He continues to feel well and remains asymptomatic.  He denies any pain. He reports fatigue and hot flashes with his Eligard treatment.  He denies any pain.  He has no neurologic complaints.  He denies any recent fevers or illnesses.  He has a good appetite and denies weight loss.  He denies any chest pain, shortness of breath, cough, or hemoptysis.  He denies any nausea, vomiting, constipation, or diarrhea.  He has no urinary complaints.  Patient offers no further specific complaints today.  REVIEW OF SYSTEMS:   Review of Systems  Constitutional: Positive for malaise/fatigue. Negative for fever and weight loss.  Respiratory: Negative.  Negative for cough, hemoptysis and shortness of breath.   Cardiovascular: Negative.  Negative for chest pain and leg swelling.  Gastrointestinal: Negative.  Negative for abdominal pain.  Genitourinary: Negative.  Negative for dysuria and hematuria.  Musculoskeletal: Negative.  Negative for back pain.  Skin: Negative.  Negative for rash.  Neurological: Positive for sensory change. Negative for dizziness, focal weakness, weakness and headaches.  Psychiatric/Behavioral: Negative.  The patient is not nervous/anxious.     As per HPI. Otherwise, a complete review of systems is negative.  PAST MEDICAL HISTORY: Past Medical History:  Diagnosis Date  . Arthritis    lower back   . Cancer [facility])    prostate cancer   . Coronary artery disease    stent RCA- 2000 and 12/2009   . GERD (gastroesophageal reflux disease)   .  Hyperlipidemia   . Hypertension   . Skin cancer     PAST SURGICAL HISTORY: Past Surgical History:  Procedure Laterality Date  . APPENDECTOMY    . COLONOSCOPY WITH PROPOFOL N/A 07/18/2017   Procedure: COLONOSCOPY WITH PROPOFOL;  Surgeon: [name], MD;  Location: [facility] ENDOSCOPY;  Service: Endoscopy;  Laterality: N/A;  . CORONARY ANGIOPLASTY    . coronary stents      x 2  . KNEE ARTHROPLASTY Left 07/20/2017   Procedure: COMPUTER ASSISTED TOTAL KNEE ARTHROPLASTY;  Surgeon: [name], MD;  Location: [facility] ORS;  Service: Orthopedics;  Laterality: Left;  . KNEE ARTHROPLASTY Right 01/23/2020   Procedure: COMPUTER ASSISTED TOTAL KNEE ARTHROPLASTY;  Surgeon: [name], MD;  Location: [facility] ORS;  Service: Orthopedics;  Laterality: Right;  . ROBOT ASSISTED LAPAROSCOPIC RADICAL PROSTATECTOMY N/A 07/16/2013   Procedure: ROBOTIC ASSISTED LAPAROSCOPIC RADICAL PROSTATECTOMY LEVEL 2, bilateral pelvic lymphadenectomy;  Surgeon: [name], MD;  Location: [facility] ORS;  Service: Urology;  Laterality: N/A;  . TONSILLECTOMY     and adenoidectomy     FAMILY HISTORY: Family History  Problem Relation Age of Onset  . Cancer Mother 40       cervical cancer  . Heart attack Father   . Thyroid cancer Daughter 32       doing well    ADVANCED DIRECTIVES (Y/N):  N  HEALTH MAINTENANCE: Social History   Tobacco Use  . Smoking status: Former Smoker    Packs/day: 1.00    Years: 30.00    Pack years: 30.00    Types: Cigarettes    Quit  date: 11/01/1992    Years since quitting: 28.3  . Smokeless tobacco: Never Used  Vaping Use  . Vaping Use: Never used  Substance Use Topics  . Alcohol use: No  . Drug use: No     Colonoscopy:  PAP:  Bone density:  Lipid panel:  Allergies  Allergen Reactions  . Ampicillin Other (See Comments)    Raw mouth and throat   . Atorvastatin     Other reaction(s): Unknown  . Carvedilol     Other reaction(s): Dizziness  . Contrast Media [Iodinated Diagnostic  Agents] Hives  . Diltiazem Hcl Other (See Comments)  . Fluvastatin Other (See Comments)  . Lovastatin Other (See Comments)  . Sulfa Antibiotics Hives  . Betadine [Povidone Iodine] Rash    Current Outpatient Medications  Medication Sig Dispense Refill  . acetaminophen (TYLENOL) 500 MG tablet Take 1,000 mg by mouth 2 (two) times daily as needed.     [name] atenolol (TENORMIN) 100 MG tablet Take 100 mg by mouth every morning.    [name] b complex vitamins capsule Take 1 capsule by mouth daily.    . Calcium Carbonate-Vit D-Min (CALTRATE 600+D PLUS PO) Take 1 tablet by mouth daily.     . Coenzyme Q10 (COQ10) 100 MG CAPS Take 100 mg by mouth at bedtime.     . felodipine (PLENDIL) 10 MG 24 hr tablet Take 10 mg by mouth at bedtime.     . folic acid (FOLVITE) 322 MCG tablet Take 800 mcg by mouth at bedtime.     . Inositol Niacinate (NIACIN FLUSH FREE) 500 MG CAPS Take 1,000 mg by mouth in the morning and at bedtime.    [name] Leuprolide Acetate, 6 Month, (LUPRON DEPOT, 98-MONTH,) 45 MG injection Inject 45 mg into the muscle every 6 (six) months.    [name] lisinopril-hydrochlorothiazide (PRINZIDE,ZESTORETIC) 20-12.5 MG per tablet Take 1 tablet by mouth every morning.    [name] omeprazole (PRILOSEC) 20 MG capsule Take 20 mg by mouth daily.    . rosuvastatin (CRESTOR) 10 MG tablet Take 10 mg by mouth daily.    [name] triamcinolone (NASACORT) 55 MCG/ACT AERO nasal inhaler Place 2 sprays into the nose daily.     . vitamin B-12 (CYANOCOBALAMIN) 1000 MCG tablet Take 1,000 mcg by mouth daily.    . vitamin E 400 UNIT capsule Take 400 Units by mouth daily.     No current facility-administered medications for this visit.   Facility-Administered Medications Ordered in Other Visits  Medication Dose Route Frequency Provider Last Rate Last Admin  . Leuprolide Acetate (6 Month) (LUPRON) injection 45 mg  45 mg Intramuscular Once Lloyd Huger, MD        OBJECTIVE: There were no vitals filed for this visit.   There is no height or  weight on file to calculate BMI.    ECOG FS:0 - Asymptomatic  General: Well-developed, well-nourished, no acute distress. Eyes: Pink conjunctiva, anicteric sclera. HEENT: Normocephalic, moist mucous membranes. Lungs: No audible wheezing or coughing. Heart: Regular rate and rhythm. Abdomen: Soft, nontender, no obvious distention. Musculoskeletal: No edema, cyanosis, or clubbing. Neuro: Alert, answering all questions appropriately. Cranial nerves grossly intact. Skin: No rashes or petechiae noted. Psych: Normal affect.   LAB RESULTS:  Lab Results  Component Value Date   NA 136 12/08/2020   K 4.2 12/08/2020   CL 104 12/08/2020   CO2 24 12/08/2020   GLUCOSE 131 (H) 12/08/2020   BUN 24 (H) 12/08/2020   CREATININE 1.35 (H) 12/08/2020  CALCIUM 9.3 12/08/2020   PROT 6.7 01/15/2020   ALBUMIN 4.1 01/15/2020   AST 23 01/15/2020   ALT 32 01/15/2020   ALKPHOS 71 01/15/2020   BILITOT 1.3 (H) 01/15/2020   GFRNONAA 57 (L) 12/08/2020   GFRAA >60 01/15/2020    Lab Results  Component Value Date   WBC 6.7 01/15/2020   HGB 13.6 01/15/2020   HCT 39.9 01/15/2020   MCV 91.7 01/15/2020   PLT 194 01/15/2020     STUDIES: No results found.  ONCOLOGY TREATMENT HISTORY: Patient underwent radical prostatectomy on July 06, 2013.  Final pathology reported Gleason 7 (4+3), extracapsular extension, but margins were clear.  Seminal vesicles were not involved and 0 of 8 lymph nodes were negative for disease.  Patient noted to have a rising PSA and underwent salvage XRT in Spring of 2017.  Nuclear med bone scan on May 30, 2017 revealed improvement of uptake in the right posterior inferior pubic ramus and distal left femoral metadiaphysis.  PET scan on October 31, 2018 revealed no evidence of disease.  Patient reinitiated Lupron/Eligard on November 02, 2018.    ASSESSMENT: Stage IIIb prostate cancer  PLAN:    1.  Stage IIIb prostate cancer: Patient's PSA continues to trend up and is now 21.43.   Despite this, F18-PYLARIFY PET scan on September 04, 2020 revealed no evidence of distant disease.  Patient last received Eligard on August 27, 2020, but it appears he is now castrate resistant.  He does not wish to initiate treatment with Zytiga or Xtandi at this time.  Rather, he wishes to proceed with repeat PET scan on Mar 03, 2021 with follow-up 1 to 2 days later and discuss the results and treatment options.   2.  Renal insufficiency: Chronic and unchanged.  Patient's most recent creatinine was 1.35.   I spent a total of 30 minutes reviewing chart data, face-to-face evaluation with the patient, counseling and coordination of care as detailed above.   Patient expressed understanding and was in agreement with this plan. He also understands that He can call clinic at any time with any questions, concerns, or complaints.   Cancer Staging Prostate cancer [facility]) Staging form: Prostate, AJCC 8th Edition - Clinical stage from 07/08/2018: Stage IIIB (cT3a, cN0, cM0, Grade Group: 3) - Signed by Lloyd Huger, MD on 07/08/2018 Gleason score: 7 Histologic grading system: 5 grade system   Lloyd Huger, MD   02/28/2021 7:56 AM

## 2021-03-03 ENCOUNTER — Inpatient Hospital Stay: Payer: Medicare Other | Attending: Oncology

## 2021-03-03 ENCOUNTER — Ambulatory Visit: Admission: RE | Admit: 2021-03-03 | Payer: Medicare Other | Source: Ambulatory Visit

## 2021-03-03 DIAGNOSIS — Z5111 Encounter for antineoplastic chemotherapy: Secondary | ICD-10-CM | POA: Diagnosis not present

## 2021-03-03 DIAGNOSIS — Z79899 Other long term (current) drug therapy: Secondary | ICD-10-CM | POA: Diagnosis not present

## 2021-03-03 DIAGNOSIS — C61 Malignant neoplasm of prostate: Secondary | ICD-10-CM | POA: Diagnosis present

## 2021-03-03 LAB — BASIC METABOLIC PANEL
Anion gap: 11 (ref 5–15)
BUN: 20 mg/dL (ref 8–23)
CO2: 23 mmol/L (ref 22–32)
Calcium: 9.3 mg/dL (ref 8.9–10.3)
Chloride: 101 mmol/L (ref 98–111)
Creatinine, Ser: 1.28 mg/dL — ABNORMAL HIGH (ref 0.61–1.24)
GFR, Estimated: >60 mL/min (ref >60)
Glucose, Bld: 125 mg/dL — ABNORMAL HIGH (ref 70–99)
Potassium: 4 mmol/L (ref 3.5–5.1)
Sodium: 135 mmol/L (ref 135–145)

## 2021-03-03 LAB — PSA: Prostatic Specific Antigen: 65.34 ng/mL — ABNORMAL HIGH (ref 0.00–4.00)

## 2021-03-04 ENCOUNTER — Telehealth: Payer: Self-pay | Admitting: Oncology

## 2021-03-04 LAB — TESTOSTERONE: Testosterone: 23 ng/dL — ABNORMAL LOW (ref 264–916)

## 2021-03-04 NOTE — Telephone Encounter (Signed)
Wife was contacted and she already knew the new scan date and the new f/u visit with finnegan. We did take the lab encounter off because the patient does not need labs and they were already done earlier in the week. Wife understands.

## 2021-03-04 NOTE — Telephone Encounter (Signed)
Patient's wife called requesting to reschedule MD follow-up due to his PET scan being rescheduled. She wanted to confirm that labs would need to be repeated since he had labs drawn yesterday (PSA/Testosterone/BMP--visible in chart).   Appointment for MD and lab work (2 days prior per pt request) have been made. Routing to clinical team to confirm need for labs.

## 2021-03-05 ENCOUNTER — Inpatient Hospital Stay: Payer: Medicare Other

## 2021-03-05 ENCOUNTER — Inpatient Hospital Stay: Payer: Medicare Other | Admitting: Oncology

## 2021-03-05 DIAGNOSIS — C61 Malignant neoplasm of prostate: Secondary | ICD-10-CM

## 2021-03-06 ENCOUNTER — Ambulatory Visit: Payer: Medicare Other | Admitting: Oncology

## 2021-03-06 ENCOUNTER — Other Ambulatory Visit: Payer: Medicare Other

## 2021-03-06 ENCOUNTER — Ambulatory Visit: Payer: Medicare Other

## 2021-03-10 ENCOUNTER — Ambulatory Visit
Admission: RE | Admit: 2021-03-10 | Discharge: 2021-03-10 | Disposition: A | Discharge status: Home / Self Care | Payer: Medicare Other | Source: Ambulatory Visit | Attending: Oncology | Admitting: Oncology

## 2021-03-10 ENCOUNTER — Other Ambulatory Visit: Payer: Self-pay

## 2021-03-10 ENCOUNTER — Other Ambulatory Visit: Payer: Medicare Other

## 2021-03-10 DIAGNOSIS — C61 Malignant neoplasm of prostate: Secondary | ICD-10-CM | POA: Diagnosis not present

## 2021-03-10 IMAGING — CT NM PET TUM IMG SKULL BASE T - THIGH
9 series · 25 of 25 positions shown · non-contrast
Comparison: [facility] PET-CT scan 09/04/2020

CLINICAL DATA: 69-year-old male with prostate cancer. Prostatectomy
07/16/2013.

EXAM:
NUCLEAR MEDICINE PET SKULL BASE TO THIGH
TECHNIQUE: 9.3 mCi F18 Piflufolastat (Pylarify) was injected intravenously.
Full-ring PET imaging was performed from the skull base to thigh
after the radiotracer. CT data was obtained and used for attenuation
correction and anatomic localization.

[Series 3: ct wb 5.0 b30f · axial · 5.0mm · 0.98mm/px · z∈[-315,+669]mm · 3 of 329 slices shown]
[im 1/329  brain]
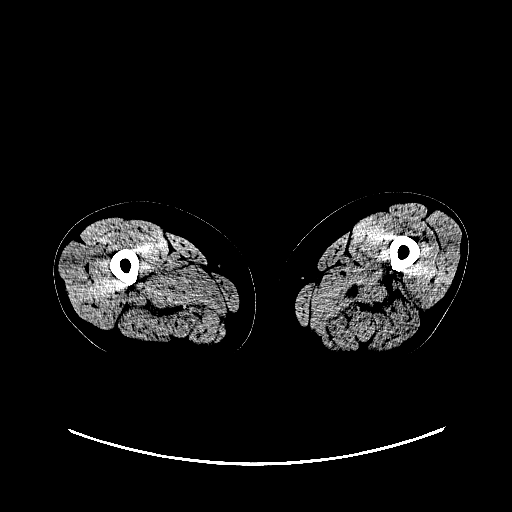
[im 165/329]
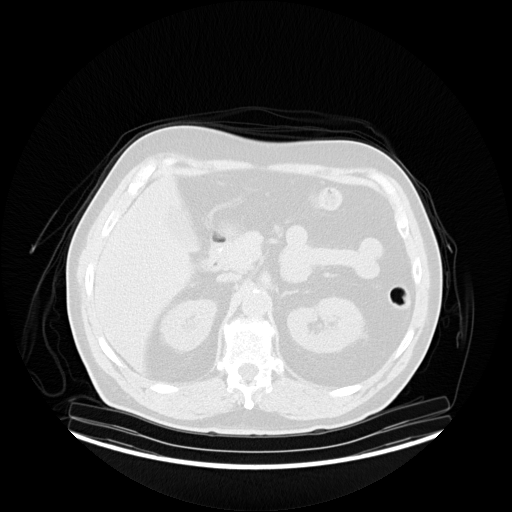
[im 329/329  brain]
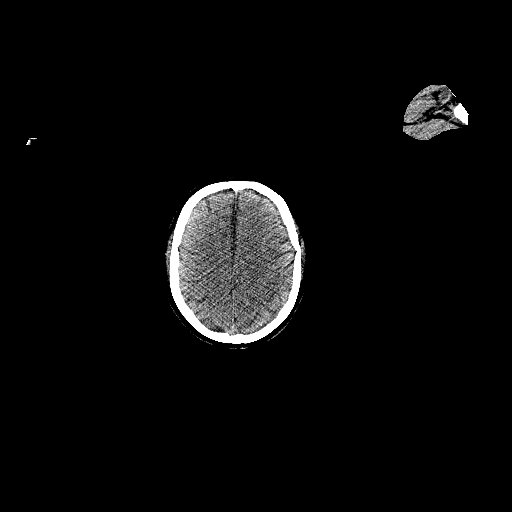

[Series 5: pet wb uncorrected (nac) · axial · 5.0mm · 4.07mm/px · z∈[-315,+669]mm · 4 of 329 slices shown]
[im 1/329]
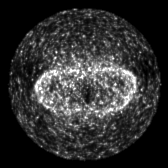
[im 110/329]
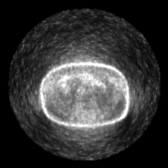
[im 219/329]
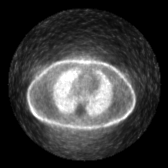
[im 329/329]
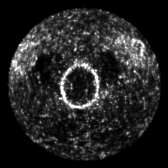

[Series 6: pet wb (ac) · axial · 5.0mm · 3.39mm/px · z∈[-315,+669]mm · 4 of 329 slices shown]
[im 1/329]
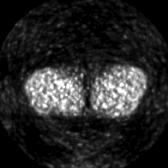
[im 110/329]
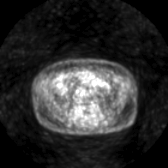
[im 219/329]
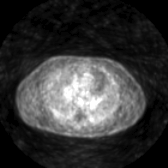
[im 329/329]
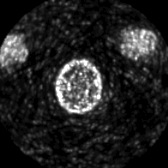

[Series 603: pet axial fused · 4 of 327 slices shown]
[im 1/327]
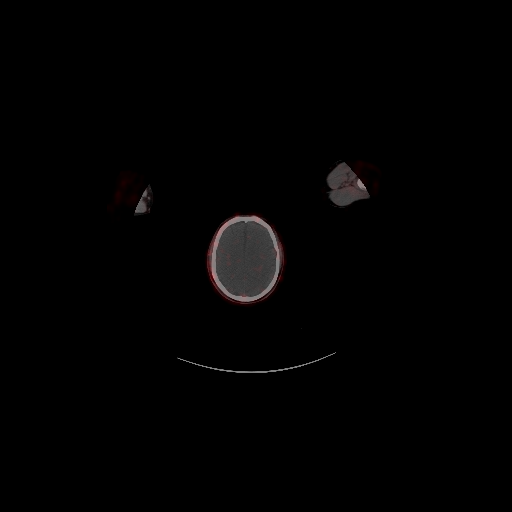
[im 109/327]
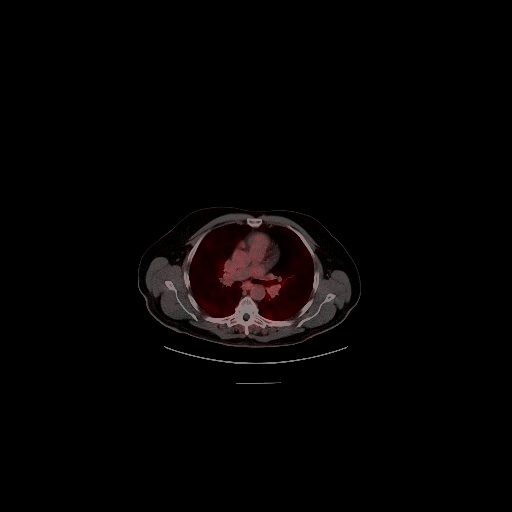
[im 218/327]
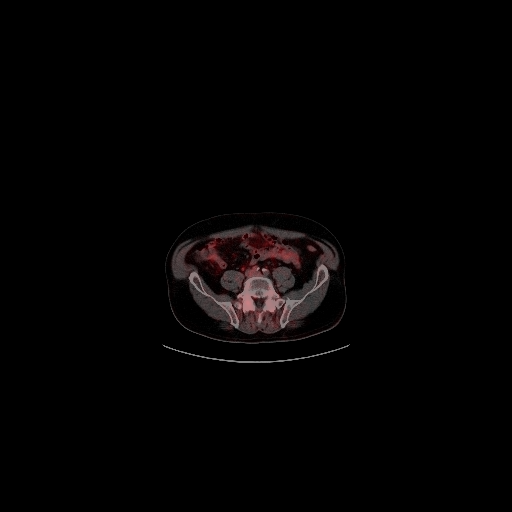
[im 327/327]
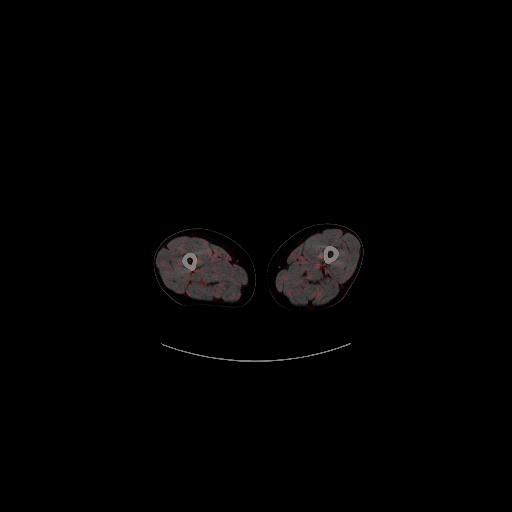

[Series 604: pet coronal fused · 1 of 90 slices shown]
[im 1/90]
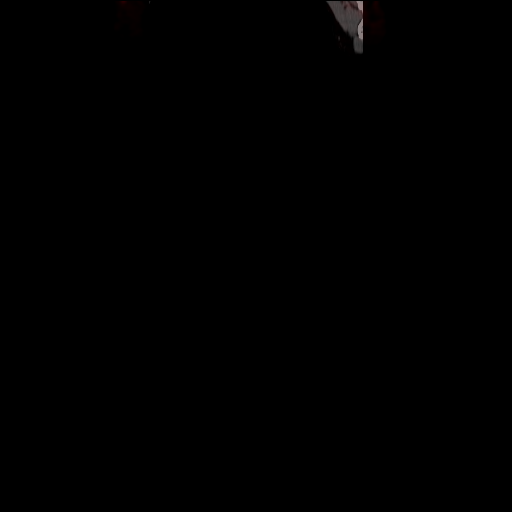

[Series 605: pet sagittal fused · 2 of 158 slices shown]
[im 1/158]
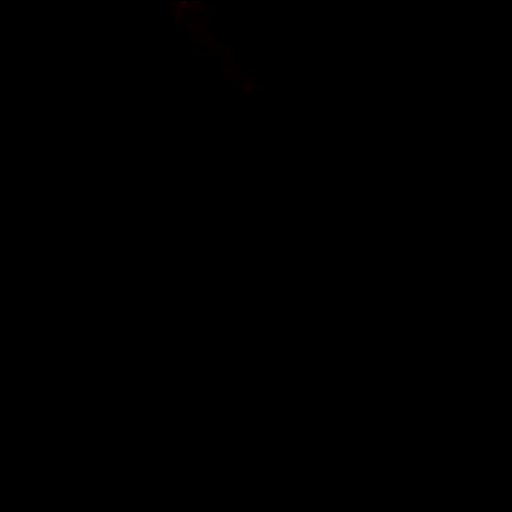
[im 158/158]
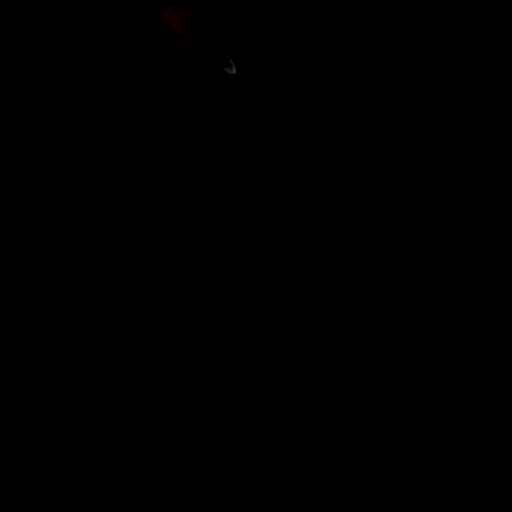

[Series 606: pet axial · 4 of 327 slices shown]
[im 1/327]
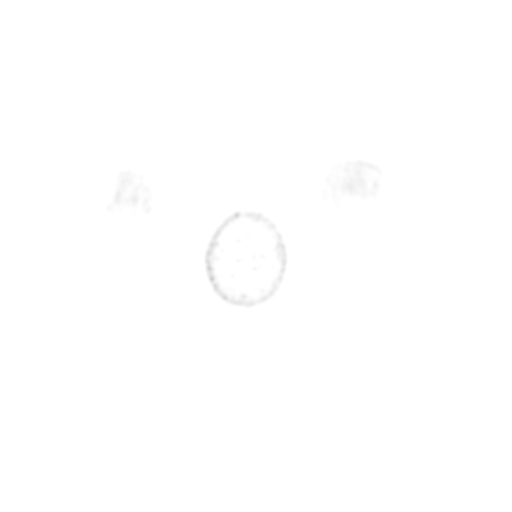
[im 109/327]
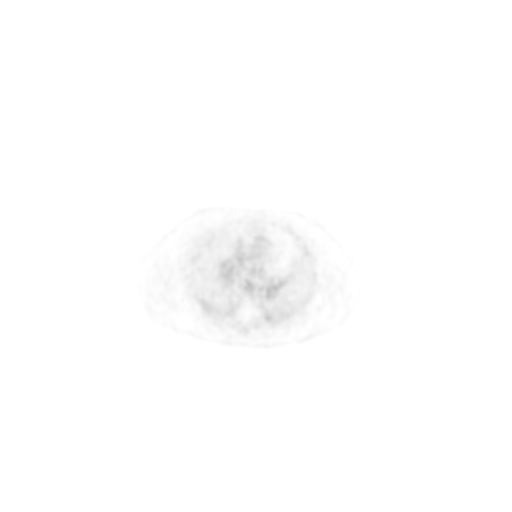
[im 218/327]
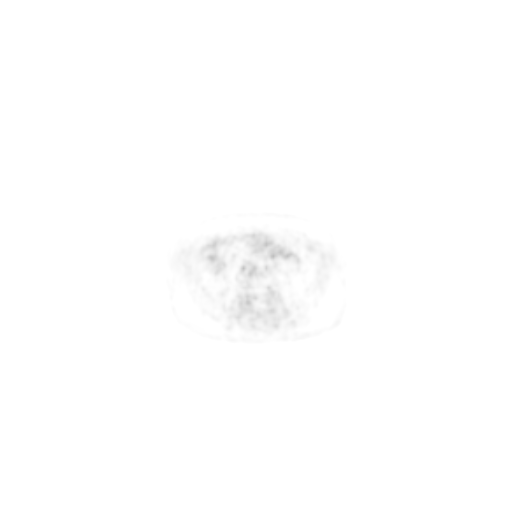
[im 327/327]
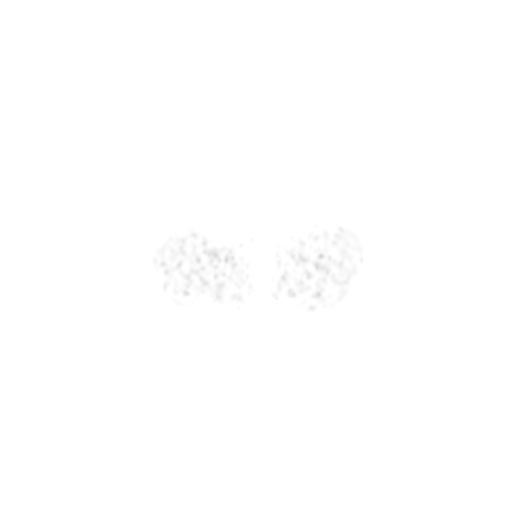

[Series 607: pet coronal · 1 of 98 slices shown]
[im 1/98]
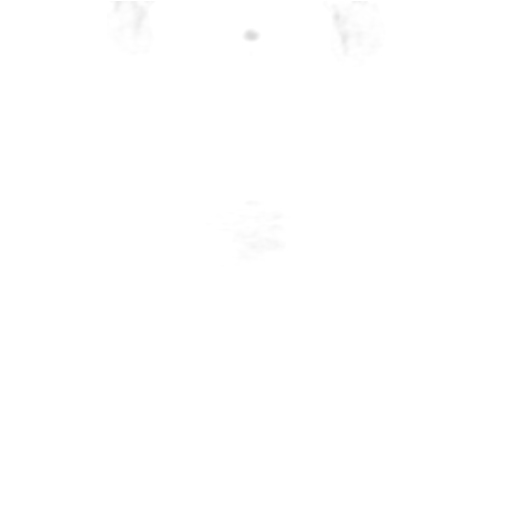

[Series 608: pet sagittal · 2 of 158 slices shown]
[im 1/158]
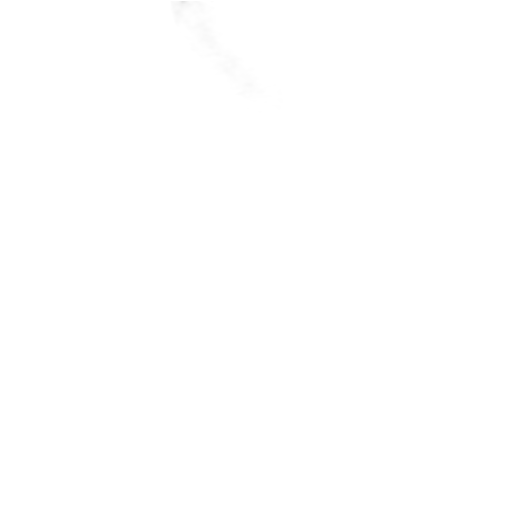
[im 158/158]
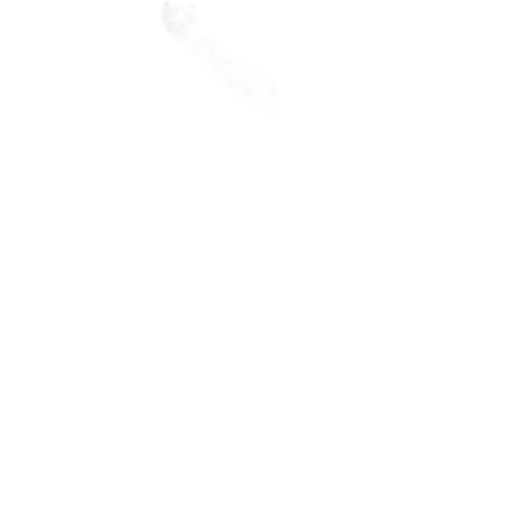

[25 of 25 positions shown; findings below may reference images not displayed]

FINDINGS: NECK

No radiotracer activity in neck lymph nodes.

Incidental CT finding: None

CHEST

No radiotracer accumulation within mediastinal or hilar lymph nodes.
No suspicious pulmonary nodules on the CT scan.

Incidental CT finding: None

ABDOMEN/PELVIS

Prostate: No focal activity in the prostate bed.

Lymph nodes: No abnormal radiotracer accumulation within pelvic or
abdominal nodes.

Liver: No evidence of liver metastasis

Incidental CT finding: None

SKELETON

No focal  activity to suggest skeletal metastasis.
IMPRESSION: 1. No evidence of local recurrence in the prostate bed.
2. No evidence of metastatic adenopathy in the pelvis or periaortic
retroperitoneum.
3. No evidence of visceral metastasis or skeletal metastasis

## 2021-03-10 MED ORDER — PIFLIFOLASTAT F 18 (PYLARIFY) INJECTION
9.0000 | Freq: Once | INTRAVENOUS | Status: AC
  Administered 2021-03-10: 9.31 via INTRAVENOUS

## 2021-03-11 ENCOUNTER — Other Ambulatory Visit: Payer: Self-pay

## 2021-03-12 ENCOUNTER — Inpatient Hospital Stay: Payer: Medicare Other

## 2021-03-12 ENCOUNTER — Other Ambulatory Visit: Payer: Medicare Other

## 2021-03-12 ENCOUNTER — Inpatient Hospital Stay (HOSPITAL_BASED_OUTPATIENT_CLINIC_OR_DEPARTMENT_OTHER): Payer: Medicare Other | Admitting: Oncology

## 2021-03-12 ENCOUNTER — Other Ambulatory Visit: Payer: Self-pay

## 2021-03-12 VITALS — BP 104/75 | HR 61 | Temp 97.8°F | Wt 212.6 lb

## 2021-03-12 DIAGNOSIS — C61 Malignant neoplasm of prostate: Secondary | ICD-10-CM | POA: Diagnosis not present

## 2021-03-12 DIAGNOSIS — Z5111 Encounter for antineoplastic chemotherapy: Secondary | ICD-10-CM | POA: Diagnosis not present

## 2021-03-12 MED ORDER — LEUPROLIDE ACETATE (6 MONTH) 45 MG ~~LOC~~ KIT
45.0000 mg | PACK | Freq: Once | SUBCUTANEOUS | Status: AC
  Administered 2021-03-12: 45 mg via SUBCUTANEOUS
  Filled 2021-03-12: qty 45

## 2021-03-12 NOTE — Progress Notes (Signed)
[place]  [phone]) [phone] [phone]) [phone]  ID: Cody Spry Sr. OB: 1950-11-19  MR#: [id number]  NAT#:[id number]  Patient Care Team: [name], MD as PCP - General (Internal Medicine)  CHIEF COMPLAINT: Stage IIIb prostate cancer  INTERVAL HISTORY: Patient returns to clinic today for further evaluation and discussion of his laboratory and imaging results.  He continues to feel well and remains asymptomatic.  He denies any pain.  He has no neurologic complaints.  He denies any recent fevers or illnesses.  He has a good appetite and denies weight loss.  He denies any chest pain, shortness of breath, cough, or hemoptysis.  He denies any nausea, vomiting, constipation, or diarrhea.  He has no urinary complaints.  Patient offers no specific complaints today.  REVIEW OF SYSTEMS:   Review of Systems  Constitutional: Negative.  Negative for fever, malaise/fatigue and weight loss.  Respiratory: Negative.  Negative for cough, hemoptysis and shortness of breath.   Cardiovascular: Negative.  Negative for chest pain and leg swelling.  Gastrointestinal: Negative.  Negative for abdominal pain.  Genitourinary: Negative.  Negative for dysuria and hematuria.  Musculoskeletal: Negative.  Negative for back pain.  Skin: Negative.  Negative for rash.  Neurological: Negative.  Negative for dizziness, sensory change, focal weakness, weakness and headaches.  Psychiatric/Behavioral: Negative.  The patient is not nervous/anxious.     As per HPI. Otherwise, a complete review of systems is negative.  PAST MEDICAL HISTORY: Past Medical History:  Diagnosis Date  . Arthritis    lower back   . Cancer [facility])    prostate cancer   . Coronary artery disease    stent RCA- 2000 and 12/2009   . GERD (gastroesophageal reflux disease)   . Hyperlipidemia   . Hypertension   . Skin cancer     PAST SURGICAL HISTORY: Past Surgical History:  Procedure Laterality Date  .  APPENDECTOMY    . COLONOSCOPY WITH PROPOFOL N/A 07/18/2017   Procedure: COLONOSCOPY WITH PROPOFOL;  Surgeon: [name], MD;  Location: [facility] ENDOSCOPY;  Service: Endoscopy;  Laterality: N/A;  . CORONARY ANGIOPLASTY    . coronary stents      x 2  . KNEE ARTHROPLASTY Left 07/20/2017   Procedure: COMPUTER ASSISTED TOTAL KNEE ARTHROPLASTY;  Surgeon: [name], MD;  Location: [facility] ORS;  Service: Orthopedics;  Laterality: Left;  . KNEE ARTHROPLASTY Right 01/23/2020   Procedure: COMPUTER ASSISTED TOTAL KNEE ARTHROPLASTY;  Surgeon: [name], MD;  Location: [facility] ORS;  Service: Orthopedics;  Laterality: Right;  . ROBOT ASSISTED LAPAROSCOPIC RADICAL PROSTATECTOMY N/A 07/16/2013   Procedure: ROBOTIC ASSISTED LAPAROSCOPIC RADICAL PROSTATECTOMY LEVEL 2, bilateral pelvic lymphadenectomy;  Surgeon: [name], MD;  Location: [facility] ORS;  Service: Urology;  Laterality: N/A;  . TONSILLECTOMY     and adenoidectomy     FAMILY HISTORY: Family History  Problem Relation Age of Onset  . Cancer Mother 39       cervical cancer  . Heart attack Father   . Thyroid cancer Daughter 69       doing well    ADVANCED DIRECTIVES (Y/N):  N  HEALTH MAINTENANCE: Social History   Tobacco Use  . Smoking status: Former Smoker    Packs/day: 1.00    Years: 30.00    Pack years: 30.00    Types: Cigarettes    Quit date: 11/01/1992    Years since quitting: 28.3  . Smokeless tobacco: Never Used  Vaping Use  . Vaping Use: Never  used  Substance Use Topics  . Alcohol use: No  . Drug use: No     Colonoscopy:  PAP:  Bone density:  Lipid panel:  Allergies  Allergen Reactions  . Ampicillin Other (See Comments)    Raw mouth and throat   . Atorvastatin     Other reaction(s): Unknown  . Carvedilol     Other reaction(s): Dizziness  . Contrast Media [Iodinated Diagnostic Agents] Hives  . Diltiazem Hcl Other (See Comments)  . Fluvastatin Other (See Comments)  . Lovastatin Other (See Comments)  . Sulfa  Antibiotics Hives  . Betadine [Povidone Iodine] Rash    Current Outpatient Medications  Medication Sig Dispense Refill  . acetaminophen (TYLENOL) 500 MG tablet Take 1,000 mg by mouth 2 (two) times daily as needed.     [name] atenolol (TENORMIN) 100 MG tablet Take 100 mg by mouth every morning.    [name] b complex vitamins capsule Take 1 capsule by mouth daily.    . Calcium Carbonate-Vit D-Min (CALTRATE 600+D PLUS PO) Take 1 tablet by mouth daily.     . Coenzyme Q10 (COQ10) 100 MG CAPS Take 100 mg by mouth at bedtime.     . felodipine (PLENDIL) 10 MG 24 hr tablet Take 10 mg by mouth at bedtime.     . folic acid (FOLVITE) 347 MCG tablet Take 800 mcg by mouth at bedtime.     . Inositol Niacinate (NIACIN FLUSH FREE) 500 MG CAPS Take 1,000 mg by mouth in the morning and at bedtime.    [name] lisinopril-hydrochlorothiazide (PRINZIDE,ZESTORETIC) 20-12.5 MG per tablet Take 1 tablet by mouth every morning.    [name] omeprazole (PRILOSEC) 20 MG capsule Take 20 mg by mouth daily.    . rosuvastatin (CRESTOR) 10 MG tablet Take 10 mg by mouth daily.    [name] triamcinolone (NASACORT) 55 MCG/ACT AERO nasal inhaler Place 2 sprays into the nose daily.     . vitamin B-12 (CYANOCOBALAMIN) 1000 MCG tablet Take 1,000 mcg by mouth daily.    . vitamin E 400 UNIT capsule Take 400 Units by mouth daily.    [name] Leuprolide Acetate, 6 Month, (LUPRON DEPOT, 81-MONTH,) 45 MG injection Inject 45 mg into the muscle every 6 (six) months.     No current facility-administered medications for this visit.   Facility-Administered Medications Ordered in Other Visits  Medication Dose Route Frequency Provider Last Rate Last Admin  . Leuprolide Acetate (6 Month) (LUPRON) injection 45 mg  45 mg Intramuscular Once Lloyd Huger, MD        OBJECTIVE: Vitals:   03/12/21 1007  BP: 104/75  Pulse: 61  Temp: 97.8 F (36.6 C)  SpO2: 98%     Body mass index is 31.4 kg/m.    ECOG FS:0 - Asymptomatic  General: Well-developed, well-nourished, no acute  distress. Eyes: Pink conjunctiva, anicteric sclera. HEENT: Normocephalic, moist mucous membranes. Lungs: No audible wheezing or coughing. Heart: Regular rate and rhythm. Abdomen: Soft, nontender, no obvious distention. Musculoskeletal: No edema, cyanosis, or clubbing. Neuro: Alert, answering all questions appropriately. Cranial nerves grossly intact. Skin: No rashes or petechiae noted. Psych: Normal affect.   LAB RESULTS:  Lab Results  Component Value Date   NA 135 03/03/2021   K 4.0 03/03/2021   CL 101 03/03/2021   CO2 23 03/03/2021   GLUCOSE 125 (H) 03/03/2021   BUN 20 03/03/2021   CREATININE 1.28 (H) 03/03/2021   CALCIUM 9.3 03/03/2021   PROT 6.7 01/15/2020   ALBUMIN 4.1 01/15/2020  AST 23 01/15/2020   ALT 32 01/15/2020   ALKPHOS 71 01/15/2020   BILITOT 1.3 (H) 01/15/2020   GFRNONAA >60 03/03/2021   GFRAA >60 01/15/2020    Lab Results  Component Value Date   WBC 6.7 01/15/2020   HGB 13.6 01/15/2020   HCT 39.9 01/15/2020   MCV 91.7 01/15/2020   PLT 194 01/15/2020     STUDIES: NM PET (F18-PYLARIFY) SKULL TO MID THIGH  Result Date: 03/11/2021 CLINICAL DATA:  70 year old male with prostate cancer. Prostatectomy 07/16/2013. EXAM: NUCLEAR MEDICINE PET SKULL BASE TO THIGH TECHNIQUE: 9.3 mCi F18 Piflufolastat (Pylarify) was injected intravenously. Full-ring PET imaging was performed from the skull base to thigh after the radiotracer. CT data was obtained and used for attenuation correction and anatomic localization. COMPARISON:  [facility] PET-CT scan 09/04/2020 FINDINGS: NECK No radiotracer activity in neck lymph nodes. Incidental CT finding: None CHEST No radiotracer accumulation within mediastinal or hilar lymph nodes. No suspicious pulmonary nodules on the CT scan. Incidental CT finding: None ABDOMEN/PELVIS Prostate: No focal activity in the prostate bed. Lymph nodes: No abnormal radiotracer accumulation within pelvic or abdominal nodes. Liver: No evidence of liver metastasis  Incidental CT finding: None SKELETON No focal  activity to suggest skeletal metastasis. IMPRESSION: 1. No evidence of local recurrence in the prostate bed. 2. No evidence of metastatic adenopathy in the pelvis or periaortic retroperitoneum. 3. No evidence of visceral metastasis or skeletal metastasis Electronically Signed   By: Suzy Bouchard M.D.   On: 03/11/2021 13:22    ONCOLOGY TREATMENT HISTORY: Patient underwent radical prostatectomy on July 06, 2013.  Final pathology reported Gleason 7 (4+3), extracapsular extension, but margins were clear.  Seminal vesicles were not involved and 0 of 8 lymph nodes were negative for disease.  Patient noted to have a rising PSA and underwent salvage XRT in Spring of 2017.  Nuclear med bone scan on May 30, 2017 revealed improvement of uptake in the right posterior inferior pubic ramus and distal left femoral metadiaphysis.  PET scan on October 31, 2018 revealed no evidence of disease.  Patient reinitiated Lupron/Eligard on November 02, 2018.    ASSESSMENT: Stage IIIb prostate cancer  PLAN:    1.  Stage IIIb prostate cancer: Patient's PSA has tripled in the last 3 months from 21.43-65.34.  Despite this, F18-PYLARIFY PET scan on Mar 11, 2021 reveals no evidence of disease.  After lengthy discussion, patient will now consider initiating Zytiga or Gillermina Phy provided it is financially viable.  Will have clinical pharmacy call patient next week to make a final decision.  Follow-up will be based on whether or not he decides to initiate treatment.  Proceed with Eligard as planned today.   2.  Renal insufficiency: Chronic and unchanged.  Patient's most recent creatinine is 1.28.  I spent a total of 30 minutes reviewing chart data, face-to-face evaluation with the patient, counseling and coordination of care as detailed above.   Patient expressed understanding and was in agreement with this plan. He also understands that He can call clinic at any time with any  questions, concerns, or complaints.   Cancer Staging Prostate cancer [facility]) Staging form: Prostate, AJCC 8th Edition - Clinical stage from 07/08/2018: Stage IIIB (cT3a, cN0, cM0, Grade Group: 3) - Signed by Lloyd Huger, MD on 07/08/2018 Gleason score: 7 Histologic grading system: 5 grade system   Lloyd Huger, MD   03/12/2021 3:00 PM

## 2021-03-17 ENCOUNTER — Telehealth: Payer: Self-pay | Admitting: Pharmacist

## 2021-03-17 DIAGNOSIS — C61 Malignant neoplasm of prostate: Secondary | ICD-10-CM

## 2021-03-18 ENCOUNTER — Other Ambulatory Visit (HOSPITAL_COMMUNITY): Payer: Self-pay

## 2021-03-19 ENCOUNTER — Encounter: Payer: Self-pay | Admitting: *Deleted

## 2021-03-19 ENCOUNTER — Encounter: Payer: Self-pay | Admitting: Oncology

## 2021-03-19 ENCOUNTER — Telehealth: Payer: Self-pay | Admitting: Pharmacy Technician

## 2021-03-19 MED ORDER — ENZALUTAMIDE 40 MG PO TABS
160.0000 mg | ORAL_TABLET | Freq: Every day | ORAL | 2 refills | Status: DC
Start: 2021-03-19 — End: 2021-04-28
  Filled 2021-03-19: qty 120, 30d supply, fill #0

## 2021-03-19 NOTE — Telephone Encounter (Addendum)
Oral Oncology Patient Advocate Encounter  Prior Authorization for Gillermina Phy has been approved.    PA# [id number] Effective dates: 03/05/21- until further notice  Patients copay: $2248.10.  Will apply for patient assistance.  Oral Oncology Clinic will continue to follow.   [place] Patient [place] Phone [phone] Fax [phone] 03/19/2021 4:31 PM

## 2021-03-19 NOTE — Telephone Encounter (Signed)
Oral Oncology Pharmacist Encounter  Received new prescription for Xtandi (enzalutamide) for the treatment of non-metastatic castration resistant prostate cancer in conjunction with ADT, planned duration until disease progression or unacceptable drug toxicity.  BP from 03/12/21 and previous tow office visits assessed, BP well controlled. Prescription dose and frequency assessed.   Current medication list in Epic reviewed, a few DDIs with enzalutamide identified: - Xtandi may decrease the serum concentration of omeprazole and felodipine. Monitor patient for decreased effectiveness of the listed medications. No baseline dose adjustment needed.   Evaluated chart and no patient barriers to medication adherence identified.   Prescription has been e-scribed to the [facility] for benefits analysis and approval.  Oral Oncology Clinic will continue to follow for insurance authorization, copayment issues, initial counseling and start date.  Patient agreed to treatment on 03/12/21 per MD documentation.  Darl Pikes, PharmD, BCPS, BCOP, CPP Hematology/Oncology Clinical Pharmacist Practitioner [facility] [place] [facility] [phone]  03/19/2021 2:06 PM

## 2021-03-19 NOTE — Telephone Encounter (Signed)
Oral Oncology Patient Advocate Encounter  Received notification from [facility] that prior authorization for Cody Ellison is required.  PA submitted on CoverMyMeds Key [id number] Status is pending  Oral Oncology Clinic will continue to follow.  [place] Patient [name] Phone [phone] Fax [phone] 03/19/2021 3:53 PM

## 2021-03-20 ENCOUNTER — Other Ambulatory Visit (HOSPITAL_COMMUNITY): Payer: Self-pay

## 2021-03-20 NOTE — Telephone Encounter (Signed)
Oral Chemotherapy Pharmacist Encounter   I called Mr. Toscano and explained the process of getting him free Xtandi from the manufacturer. He knows that Bethena Roys will begin working on this and we will update him as we go.   Darl Pikes, PharmD, BCPS, BCOP, CPP Hematology/Oncology Clinical Pharmacist [facility] [place] [facility] [phone]  03/20/2021 9:16 AM

## 2021-03-23 ENCOUNTER — Encounter: Payer: Self-pay | Admitting: Oncology

## 2021-03-23 ENCOUNTER — Other Ambulatory Visit (HOSPITAL_COMMUNITY): Payer: Self-pay

## 2021-03-26 ENCOUNTER — Telehealth: Payer: Self-pay | Admitting: Pharmacy Technician

## 2021-03-26 NOTE — Telephone Encounter (Signed)
Oral Oncology Patient Advocate Encounter  Cyndee, rep from [employer], called yesterday to inform me of benefits investigation results and that patients application would be forwarded to the patient assistance foundation.  [place] Patient [place] Phone [phone] Fax [phone] 03/26/2021 9:04 AM

## 2021-03-26 NOTE — Telephone Encounter (Signed)
Oral Oncology Patient Advocate Encounter  Met patient in [place] on 03/23/21 to complete an application for [employer] in an effort to reduce the patient's out of pocket expense for Xtandi to $0.    HCP portion was completed and signed on 03/25/21.   Application faxed to [phone] on 03/25/21.   [place] phone number for follow up is [phone].   This encounter will be updated until final determination.  [place] Patient [place] Phone [phone] Fax [phone] 03/26/2021 9:02 AM

## 2021-03-27 NOTE — Telephone Encounter (Signed)
Oral Oncology Patient Advocate Encounter  Received notification from [facility] that patient has been successfully enrolled into their program to receive Xtandi from the manufacturer at $0 out of pocket until 10/31/21.    I will call and inform the patient of the approval and that he may need to reapply for next year.   Specialty Pharmacy that will dispense medication is [facility].  Patient knows to call the office with questions or concerns.   Oral Oncology Clinic will continue to follow.  [place] Patient [place] Phone [phone] Fax [phone] 03/27/2021 2:09 PM

## 2021-03-31 NOTE — Telephone Encounter (Signed)
Oral Chemotherapy Pharmacist Encounter  The [facility] have not heard her the manufacturer assaitance program yet about setting up delivery for his Xtandi. Provided them with the number to Michie to set-up delivery [phone]). He will call to let us know when the Gillermina Phy will be delivered so that his clinic f/u appt can be scheduled.   Patient Education I spoke with patient and his wife for overview of new oral chemotherapy medication: Xtandi (enzalutamide) for the treatment of non-metastatic castration resistant prostate cancer in conjunction with ADT, planned duration until disease progression or unacceptable drug toxicity.   Counseled patient on administration, dosing, side effects, monitoring, drug-food interactions, safe handling, storage, and disposal. Patient will take 4 tablets (160 mg total) by mouth daily.  Side effects include but not limited to: fatigue, elevated glucose, risk of seizure (low).    Reviewed with patient importance of keeping a medication schedule and plan for any missed doses.  After discussion with patient no patient barriers to medication adherence identified.   The [facility] voiced understanding and appreciation. All questions answered. Medication handout provided.  Provided patient with Oral [place] phone number. Patient knows to call the office with questions or concerns. Oral Chemotherapy Navigation Clinic will continue to follow.  Darl Pikes, PharmD, BCPS, BCOP, CPP Hematology/Oncology Clinical Pharmacist Practitioner [facility] [place] [facility] [phone]  03/31/2021 2:29 PM

## 2021-04-01 NOTE — Telephone Encounter (Signed)
Patient to receive shipment of Xtandi on 04/07/21.

## 2021-04-01 NOTE — Telephone Encounter (Signed)
Mrs. Abarca called to tell me the Gillermina Phy would be delivered on 04/06/21. They know Mr. Bowens can get started once he has medication in hand. Message sent to have a one month follow-up appt added.

## 2021-04-25 NOTE — Progress Notes (Signed)
[place]  [phone] [phone]  ID: Cody Spry Sr. OB: December 14, 1950  MR#: [id number]  YHC#:[id number]  Patient Care Team: [name], MD as PCP - General (Internal Medicine)  CHIEF COMPLAINT: Stage IIIb prostate cancer  INTERVAL HISTORY: Patient returns to clinic today for further evaluation and to assess his toleration of Xtandi.  He continues to feel well and remains asymptomatic.  He is tolerating treatment well without significant side effects. He denies any pain.  He has no neurologic complaints.  He denies any recent fevers or illnesses.  He has a good appetite and denies weight loss.  He denies any chest pain, shortness of breath, cough, or hemoptysis.  He denies any nausea, vomiting, constipation, or diarrhea.  He has no urinary complaints.  Patient feels at his baseline offers no specific complaints today.  REVIEW OF SYSTEMS:   Review of Systems  Constitutional: Negative.  Negative for fever, malaise/fatigue and weight loss.  Respiratory: Negative.  Negative for cough, hemoptysis and shortness of breath.   Cardiovascular: Negative.  Negative for chest pain and leg swelling.  Gastrointestinal: Negative.  Negative for abdominal pain.  Genitourinary: Negative.  Negative for dysuria and hematuria.  Musculoskeletal: Negative.  Negative for back pain.  Skin: Negative.  Negative for rash.  Neurological: Negative.  Negative for dizziness, sensory change, focal weakness, weakness and headaches.  Psychiatric/Behavioral: Negative.  The patient is not nervous/anxious.    As per HPI. Otherwise, a complete review of systems is negative.  PAST MEDICAL HISTORY: Past Medical History:  Diagnosis Date   Arthritis    lower back    Cancer [facility])    prostate cancer    Coronary artery disease    stent RCA- 2000 and 12/2009    GERD (gastroesophageal reflux disease)    Hyperlipidemia    Hypertension    Skin cancer     PAST SURGICAL  HISTORY: Past Surgical History:  Procedure Laterality Date   APPENDECTOMY     COLONOSCOPY WITH PROPOFOL N/A 07/18/2017   Procedure: COLONOSCOPY WITH PROPOFOL;  Surgeon: [name], MD;  Location: [facility] ENDOSCOPY;  Service: Endoscopy;  Laterality: N/A;   CORONARY ANGIOPLASTY     coronary stents      x 2   KNEE ARTHROPLASTY Left 07/20/2017   Procedure: COMPUTER ASSISTED TOTAL KNEE ARTHROPLASTY;  Surgeon: [name], MD;  Location: [facility] ORS;  Service: Orthopedics;  Laterality: Left;   KNEE ARTHROPLASTY Right 01/23/2020   Procedure: COMPUTER ASSISTED TOTAL KNEE ARTHROPLASTY;  Surgeon: [name], MD;  Location: [facility] ORS;  Service: Orthopedics;  Laterality: Right;   ROBOT ASSISTED LAPAROSCOPIC RADICAL PROSTATECTOMY N/A 07/16/2013   Procedure: ROBOTIC ASSISTED LAPAROSCOPIC RADICAL PROSTATECTOMY LEVEL 2, bilateral pelvic lymphadenectomy;  Surgeon: [name], MD;  Location: [facility] ORS;  Service: Urology;  Laterality: N/A;   TONSILLECTOMY     and adenoidectomy     FAMILY HISTORY: Family History  Problem Relation Age of Onset   Cancer Mother 28       cervical cancer   Heart attack Father    Thyroid cancer Daughter 31       doing well    ADVANCED DIRECTIVES (Y/N):  N  HEALTH MAINTENANCE: Social History   Tobacco Use   Smoking status: Former    Packs/day: 1.00    Years: 30.00    Pack years: 30.00    Types: Cigarettes    Quit date: 11/01/1992    Years since quitting: 28.5   Smokeless tobacco:  Never  Vaping Use   Vaping Use: Never used  Substance Use Topics   Alcohol use: No   Drug use: No     Colonoscopy:  PAP:  Bone density:  Lipid panel:  Allergies  Allergen Reactions   Ampicillin Other (See Comments)    Raw mouth and throat    Atorvastatin     Other reaction(s): Unknown   Carvedilol     Other reaction(s): Dizziness   Contrast Media [Iodinated Diagnostic Agents] Hives   Diltiazem Hcl Other (See Comments)   Fluvastatin Other (See Comments)   Lovastatin  Other (See Comments)   Sulfa Antibiotics Hives   Betadine [Povidone Iodine] Rash   Celecoxib Palpitations    Current Outpatient Medications  Medication Sig Dispense Refill   acetaminophen (TYLENOL) 500 MG tablet Take 1,000 mg by mouth 2 (two) times daily as needed.      atenolol (TENORMIN) 100 MG tablet Take 100 mg by mouth every morning.     b complex vitamins capsule Take 1 capsule by mouth daily.     Calcium Carbonate-Vit D-Min (CALTRATE 600+D PLUS PO) Take 1 tablet by mouth daily.      Coenzyme Q10 (COQ10) 100 MG CAPS Take 100 mg by mouth at bedtime.      felodipine (PLENDIL) 10 MG 24 hr tablet Take 10 mg by mouth at bedtime.      folic acid (FOLVITE) 161 MCG tablet Take 800 mcg by mouth at bedtime.      Inositol Niacinate (NIACIN FLUSH FREE) 500 MG CAPS Take 1,000 mg by mouth in the morning and at bedtime.     Leuprolide Acetate, 6 Month, (LUPRON DEPOT, 56-MONTH,) 45 MG injection Inject 45 mg into the muscle every 6 (six) months.     lisinopril-hydrochlorothiazide (PRINZIDE,ZESTORETIC) 20-12.5 MG per tablet Take 1 tablet by mouth every morning.     omeprazole (PRILOSEC) 20 MG capsule Take 20 mg by mouth daily.     rosuvastatin (CRESTOR) 10 MG tablet Take 10 mg by mouth daily.     triamcinolone (NASACORT) 55 MCG/ACT AERO nasal inhaler Place 2 sprays into the nose daily.      vitamin B-12 (CYANOCOBALAMIN) 1000 MCG tablet Take 1,000 mcg by mouth daily.     vitamin E 400 UNIT capsule Take 400 Units by mouth daily.     enzalutamide (XTANDI) 40 MG tablet Take 4 tablets (160 mg total) by mouth daily. 120 tablet 2   No current facility-administered medications for this visit.   Facility-Administered Medications Ordered in Other Visits  Medication Dose Route Frequency Provider Last Rate Last Admin   Leuprolide Acetate (6 Month) (LUPRON) injection 45 mg  45 mg Intramuscular Once Lloyd Huger, MD        OBJECTIVE: Vitals:   04/28/21 1044  BP: 131/78  Pulse: 66  Resp: 18  Temp:  (!) 97.1 F (36.2 C)     Body mass index is 30.69 kg/m.    ECOG FS:0 - Asymptomatic  General: Well-developed, well-nourished, no acute distress. Eyes: Pink conjunctiva, anicteric sclera. HEENT: Normocephalic, moist mucous membranes. Lungs: No audible wheezing or coughing. Heart: Regular rate and rhythm. Abdomen: Soft, nontender, no obvious distention. Musculoskeletal: No edema, cyanosis, or clubbing. Neuro: Alert, answering all questions appropriately. Cranial nerves grossly intact. Skin: No rashes or petechiae noted. Psych: Normal affect.   LAB RESULTS:  Lab Results  Component Value Date   NA 136 04/27/2021   K 4.1 04/27/2021   CL 102 04/27/2021   CO2 26 04/27/2021  GLUCOSE 122 (H) 04/27/2021   BUN 23 04/27/2021   CREATININE 1.37 (H) 04/27/2021   CALCIUM 9.4 04/27/2021   PROT 6.7 01/15/2020   ALBUMIN 4.1 01/15/2020   AST 23 01/15/2020   ALT 32 01/15/2020   ALKPHOS 71 01/15/2020   BILITOT 1.3 (H) 01/15/2020   GFRNONAA 56 (L) 04/27/2021   GFRAA >60 01/15/2020    Lab Results  Component Value Date   WBC 6.7 01/15/2020   HGB 13.6 01/15/2020   HCT 39.9 01/15/2020   MCV 91.7 01/15/2020   PLT 194 01/15/2020     STUDIES: No results found.   ONCOLOGY TREATMENT HISTORY: Patient underwent radical prostatectomy on July 06, 2013.  Final pathology reported Gleason 7 (4+3), extracapsular extension, but margins were clear.  Seminal vesicles were not involved and 0 of 8 lymph nodes were negative for disease.  Patient noted to have a rising PSA and underwent salvage XRT in Spring of 2017.  Nuclear med bone scan on May 30, 2017 revealed improvement of uptake in the right posterior inferior pubic ramus and distal left femoral metadiaphysis.  PET scan on October 31, 2018 revealed no evidence of disease.  Patient reinitiated Lupron/Eligard on November 02, 2018.  He subsequently initiated Xtandi in June 2022.   ASSESSMENT: Stage IIIb prostate cancer  PLAN:    1.  Stage IIIb  prostate cancer: Patient's PSA has tripled in the last 3 months from 21.43-65.34.  Despite this, F18-PYLARIFY PET scan on Mar 11, 2021 revealed no evidence of disease.  Patient agreed to pursue treatment with [facility] which started approximately 4 weeks ago.  PSA has significantly declined on treatment and is now 4.93.  Continue Eligard every 6 months.  Patient received his last treatment on Mar 12, 2021.  Appreciate clinical pharmacy input.  Return to clinic in 6 weeks for laboratory work only and then in 3 months for laboratory work and further evaluation.     2.  Renal insufficiency: Chronic and unchanged.  Patient's creatinine is 1.37 today.  I spent a total of 30 minutes reviewing chart data, face-to-face evaluation with the patient, counseling and coordination of care as detailed above.   Patient expressed understanding and was in agreement with this plan. He also understands that He can call clinic at any time with any questions, concerns, or complaints.   Cancer Staging Prostate cancer [facility]) Staging form: Prostate, AJCC 8th Edition - Clinical stage from 07/08/2018: Stage IIIB (cT3a, cN0, cM0, Grade Group: 3) - Signed by Lloyd Huger, MD on 07/08/2018 Gleason score: 7 Histologic grading system: 5 grade system   Lloyd Huger, MD   04/30/2021 9:46 AM

## 2021-04-27 ENCOUNTER — Other Ambulatory Visit: Payer: Self-pay

## 2021-04-27 ENCOUNTER — Inpatient Hospital Stay: Payer: Medicare Other | Attending: Oncology

## 2021-04-27 DIAGNOSIS — C61 Malignant neoplasm of prostate: Secondary | ICD-10-CM | POA: Diagnosis present

## 2021-04-27 DIAGNOSIS — Z96653 Presence of artificial knee joint, bilateral: Secondary | ICD-10-CM | POA: Diagnosis not present

## 2021-04-27 DIAGNOSIS — Z87891 Personal history of nicotine dependence: Secondary | ICD-10-CM | POA: Diagnosis not present

## 2021-04-27 DIAGNOSIS — Z808 Family history of malignant neoplasm of other organs or systems: Secondary | ICD-10-CM | POA: Diagnosis not present

## 2021-04-27 DIAGNOSIS — N189 Chronic kidney disease, unspecified: Secondary | ICD-10-CM | POA: Insufficient documentation

## 2021-04-27 LAB — BASIC METABOLIC PANEL
Anion gap: 8 (ref 5–15)
BUN: 23 mg/dL (ref 8–23)
CO2: 26 mmol/L (ref 22–32)
Calcium: 9.4 mg/dL (ref 8.9–10.3)
Chloride: 102 mmol/L (ref 98–111)
Creatinine, Ser: 1.37 mg/dL — ABNORMAL HIGH (ref 0.61–1.24)
GFR, Estimated: 56 mL/min — ABNORMAL LOW (ref >60)
Glucose, Bld: 122 mg/dL — ABNORMAL HIGH (ref 70–99)
Potassium: 4.1 mmol/L (ref 3.5–5.1)
Sodium: 136 mmol/L (ref 135–145)

## 2021-04-27 LAB — PSA: Prostatic Specific Antigen: 4.93 ng/mL — ABNORMAL HIGH (ref 0.00–4.00)

## 2021-04-28 ENCOUNTER — Inpatient Hospital Stay (HOSPITAL_BASED_OUTPATIENT_CLINIC_OR_DEPARTMENT_OTHER): Payer: Medicare Other | Admitting: Oncology

## 2021-04-28 ENCOUNTER — Inpatient Hospital Stay: Payer: Medicare Other | Admitting: Pharmacist

## 2021-04-28 ENCOUNTER — Other Ambulatory Visit: Payer: Medicare Other

## 2021-04-28 VITALS — BP 131/78 | HR 66 | Temp 97.1°F | Resp 18 | Wt 207.8 lb

## 2021-04-28 DIAGNOSIS — C61 Malignant neoplasm of prostate: Secondary | ICD-10-CM

## 2021-04-28 LAB — TESTOSTERONE: Testosterone: <3 ng/dL — ABNORMAL LOW (ref 264–916)

## 2021-04-28 MED ORDER — ENZALUTAMIDE 40 MG PO TABS: 160.0000 mg | ORAL_TABLET | Freq: Every day | ORAL | 2 refills | Status: DC

## 2021-04-28 NOTE — Progress Notes (Signed)
Patient denies new problems/concerns today.   °

## 2021-04-28 NOTE — Progress Notes (Signed)
[place]  [phone] [phone]  Patient Care Team: [name], MD as PCP - General (Internal Medicine)   Name of the patient: Cody Ellison  [id number]  February 23, 1951   Date of visit: 04/28/21  HPI: Patient is a 70 y.o. male with non-metastatic castration resistant prostate cancer. Patient started his Gillermina Phy (enzalutamide) on 04/07/21.  Reason for Consult: Oral chemotherapy follow-up for Xtandi (enzalutamide) therapy.   PAST MEDICAL HISTORY: Past Medical History:  Diagnosis Date   Arthritis    lower back    Cancer [facility])    prostate cancer    Coronary artery disease    stent RCA- 2000 and 12/2009    GERD (gastroesophageal reflux disease)    Hyperlipidemia    Hypertension    Skin cancer     HEMATOLOGY/ONCOLOGY HISTORY:  Oncology History  Prostate cancer ([place])  07/03/2018 Initial Diagnosis   Prostate cancer ([place])    07/08/2018 Cancer Staging   Staging form: Prostate, AJCC 8th Edition - Clinical stage from 07/08/2018: Stage IIIB (cT3a, cN0, cM0, Grade Group: 3) - Signed by Lloyd Huger, MD on 07/08/2018      ALLERGIES:  is allergic to ampicillin, atorvastatin, carvedilol, contrast media [iodinated diagnostic agents], diltiazem hcl, fluvastatin, lovastatin, sulfa antibiotics, betadine [povidone iodine], and celecoxib.  MEDICATIONS:  Current Outpatient Medications  Medication Sig Dispense Refill   acetaminophen (TYLENOL) 500 MG tablet Take 1,000 mg by mouth 2 (two) times daily as needed.      atenolol (TENORMIN) 100 MG tablet Take 100 mg by mouth every morning.     b complex vitamins capsule Take 1 capsule by mouth daily.     Calcium Carbonate-Vit D-Min (CALTRATE 600+D PLUS PO) Take 1 tablet by mouth daily.      Coenzyme Q10 (COQ10) 100 MG CAPS Take 100 mg by mouth at bedtime.      enzalutamide (XTANDI) 40 MG tablet Take 4 tablets (160 mg total) by mouth daily. 120 tablet 2   felodipine (PLENDIL)  10 MG 24 hr tablet Take 10 mg by mouth at bedtime.      folic acid (FOLVITE) 062 MCG tablet Take 800 mcg by mouth at bedtime.      Inositol Niacinate (NIACIN FLUSH FREE) 500 MG CAPS Take 1,000 mg by mouth in the morning and at bedtime.     Leuprolide Acetate, 6 Month, (LUPRON DEPOT, 33-MONTH,) 45 MG injection Inject 45 mg into the muscle every 6 (six) months.     lisinopril-hydrochlorothiazide (PRINZIDE,ZESTORETIC) 20-12.5 MG per tablet Take 1 tablet by mouth every morning.     omeprazole (PRILOSEC) 20 MG capsule Take 20 mg by mouth daily.     rosuvastatin (CRESTOR) 10 MG tablet Take 10 mg by mouth daily.     triamcinolone (NASACORT) 55 MCG/ACT AERO nasal inhaler Place 2 sprays into the nose daily.      vitamin B-12 (CYANOCOBALAMIN) 1000 MCG tablet Take 1,000 mcg by mouth daily.     vitamin E 400 UNIT capsule Take 400 Units by mouth daily.     No current facility-administered medications for this visit.   Facility-Administered Medications Ordered in Other Visits  Medication Dose Route Frequency Provider Last Rate Last Admin   Leuprolide Acetate (6 Month) (LUPRON) injection 45 mg  45 mg Intramuscular Once Lloyd Huger, MD        VITAL SIGNS: There were no vitals taken for this visit. There were no vitals filed for this visit.  Estimated body  mass index is 30.69 kg/m as calculated from the following:   Height as of 09/05/20: 5\' 9"  (1.753 m).   Weight as of an earlier encounter on 04/28/21: 94.3 kg (207 lb 12.8 oz).  LABS: CBC:    Component Value Date/Time   WBC 6.7 01/15/2020 1317   HGB 13.6 01/15/2020 1317   HCT 39.9 01/15/2020 1317   PLT 194 01/15/2020 1317   MCV 91.7 01/15/2020 1317   Comprehensive Metabolic Panel:    Component Value Date/Time   NA 136 04/27/2021 1056   K 4.1 04/27/2021 1056   CL 102 04/27/2021 1056   CO2 26 04/27/2021 1056   BUN 23 04/27/2021 1056   CREATININE 1.37 (H) 04/27/2021 1056   GLUCOSE 122 (H) 04/27/2021 1056   CALCIUM 9.4 04/27/2021 1056    AST 23 01/15/2020 1317   ALT 32 01/15/2020 1317   ALKPHOS 71 01/15/2020 1317   BILITOT 1.3 (H) 01/15/2020 1317   PROT 6.7 01/15/2020 1317   ALBUMIN 4.1 01/15/2020 1317     Present during today's visit: Patient only  Assessment and Plan: Continue enzalutamide 160mg  daily   Oral Chemotherapy Side Effect/Intolerance:  No reported edema, constipation or diarrhea, or HTN  HTN monitoring: BP well controlled at today's visit. He reports checking his BP daily at home and also having well controlled BPs.  Oral Chemotherapy Adherence: no issues, he takes his enzalutamide nightly No patient barriers to medication adherence identified.   New medications: no reported  Medication Access Issues: Patient fills using manufacturer assistance, refill prescription sent to [place]. Patient will call tomorrow to get his refill set-up, no knows to call the office if he has any issues  Patient expressed understanding and was in agreement with this plan. He also understands that He can call clinic at any time with any questions, concerns, or complaints.   Follow-up plan: return to pharmacy clinic in 3 months  Thank you for allowing me to participate in the care of this very pleasant patient.   Time Total: 15 mins  Visit consisted of counseling and education on dealing with issues of symptom management in the setting of serious and potentially life-threatening illness.Greater than 50%  of this time was spent counseling and coordinating care related to the above assessment and plan.  Signed by: Darl Pikes, PharmD, BCPS, Salley Slaughter, CPP Hematology/Oncology Clinical Pharmacist Practitioner [facility]/HP/AP [facility] [phone]  04/28/2021 3:30 PM

## 2021-04-30 ENCOUNTER — Encounter: Payer: Self-pay | Admitting: Oncology

## 2021-06-09 ENCOUNTER — Inpatient Hospital Stay: Payer: Medicare Other | Attending: Oncology

## 2021-06-09 DIAGNOSIS — C61 Malignant neoplasm of prostate: Secondary | ICD-10-CM | POA: Diagnosis not present

## 2021-06-09 LAB — BASIC METABOLIC PANEL
Anion gap: 8 (ref 5–15)
BUN: 17 mg/dL (ref 8–23)
CO2: 28 mmol/L (ref 22–32)
Calcium: 9.6 mg/dL (ref 8.9–10.3)
Chloride: 102 mmol/L (ref 98–111)
Creatinine, Ser: 1.09 mg/dL (ref 0.61–1.24)
GFR, Estimated: >60 mL/min (ref >60)
Glucose, Bld: 101 mg/dL — ABNORMAL HIGH (ref 70–99)
Potassium: 4.1 mmol/L (ref 3.5–5.1)
Sodium: 138 mmol/L (ref 135–145)

## 2021-06-09 LAB — PSA: Prostatic Specific Antigen: 0.78 ng/mL (ref 0.00–4.00)

## 2021-06-10 LAB — TESTOSTERONE: Testosterone: 23 ng/dL — ABNORMAL LOW (ref 264–916)

## 2021-07-08 ENCOUNTER — Ambulatory Visit: Payer: Medicare Other | Admitting: Radiation Oncology

## 2021-07-08 ENCOUNTER — Other Ambulatory Visit: Payer: Self-pay | Admitting: Oncology

## 2021-07-08 DIAGNOSIS — C61 Malignant neoplasm of prostate: Secondary | ICD-10-CM

## 2021-07-16 ENCOUNTER — Ambulatory Visit
Admission: RE | Admit: 2021-07-16 | Discharge: 2021-07-16 | Disposition: A | Discharge status: Home / Self Care | Payer: Medicare Other | Source: Ambulatory Visit | Attending: Radiation Oncology | Admitting: Radiation Oncology

## 2021-07-16 ENCOUNTER — Encounter: Payer: Self-pay | Admitting: Radiation Oncology

## 2021-07-16 VITALS — BP 109/78 | HR 65 | Temp 97.8°F | Resp 18 | Wt 208.6 lb

## 2021-07-16 DIAGNOSIS — C61 Malignant neoplasm of prostate: Secondary | ICD-10-CM | POA: Diagnosis not present

## 2021-07-16 DIAGNOSIS — Z923 Personal history of irradiation: Secondary | ICD-10-CM | POA: Insufficient documentation

## 2021-07-16 NOTE — Progress Notes (Signed)
Radiation Oncology Follow up Note  Name: Cody LAIL Sr.   Date:   07/16/2021 MRN:  [id number] DOB: [birth year]    This 70 y.o. male presents to the clinic today for 5 and half year follow-up status post salvage radiation therapy to his prostate status post prostatectomy for Gleason 7 adenocarcinoma.  REFERRING PROVIDER: [name], MD  HPI: Patient is a 70 year old male now out 5-1/2 years having completed salvage radiation therapy for Gleason 7 adenocarcinoma the prostate status post robotic assisted prostatectomy.  He is seen today in routine follow-up is doing well specifically denies any bone pain any significant increase in lower urinary tract symptoms or diarrhea..  He had a PET CT with PSA imaging in May 2022 showing no evidence of disease.  He is currently on on Xtandi and Eligard every 6 months.  His most recent PSA back in August was [id number]  COMPLICATIONS OF TREATMENT: none  FOLLOW UP COMPLIANCE: keeps appointments   PHYSICAL EXAM:  BP 109/78   Pulse 65   Temp 97.8 F (36.6 C)   Resp 18   Wt 208 lb 9.6 oz (94.6 kg)   SpO2 96%   BMI 30.80 kg/m  Well-developed well-nourished patient in NAD. HEENT reveals PERLA, EOMI, discs not visualized.  Oral cavity is clear. No oral mucosal lesions are identified. Neck is clear without evidence of cervical or supraclavicular adenopathy. Lungs are clear to A&P. Cardiac examination is essentially unremarkable with regular rate and rhythm without murmur rub or thrill. Abdomen is benign with no organomegaly or masses noted. Motor sensory and DTR levels are equal and symmetric in the upper and lower extremities. Cranial nerves II through XII are grossly intact. Proprioception is intact. No peripheral adenopathy or edema is identified. No motor or sensory levels are noted. Crude visual fields are within normal range.  RADIOLOGY RESULTS: PET CT scan reviewed compatible with above-stated findings  PLAN: Present time patient is doing well  currently under medical oncology's direction for castrate sensitive prostate cancer.  I am pleased with his overall progress and his PET results.  I have asked to see him back in 6 months for follow-up.  Patient knows to call anytime with any concerns.  I would like to take this opportunity to thank you for allowing me to participate in the care of your patient.Noreene Filbert, MD

## 2021-07-25 NOTE — Progress Notes (Signed)
[place]  [phone] [phone]  ID: Cody Ellison Sr. OB: 05-06-1951  MR#: [id number]  XEN#:[id number]  Patient Care Team: [name], MD as PCP - General (Internal Medicine)  CHIEF COMPLAINT: Stage IIIb prostate cancer  INTERVAL HISTORY: Patient returns to clinic today for repeat laboratory work, further evaluation, and continuation of treatment with Xtandi.  He does feel well and remains asymptomatic.  He is tolerating his treatments without significant side effects.  He denies any pain.  He has no neurologic complaints.  He denies any recent fevers or illnesses.  He has a good appetite and denies weight loss.  He denies any chest pain, shortness of breath, cough, or hemoptysis.  He denies any nausea, vomiting, constipation, or diarrhea.  He has no urinary complaints.  Patient offers no specific concerns today.  REVIEW OF SYSTEMS:   Review of Systems  Constitutional: Negative.  Negative for fever, malaise/fatigue and weight loss.  Respiratory: Negative.  Negative for cough, hemoptysis and shortness of breath.   Cardiovascular: Negative.  Negative for chest pain and leg swelling.  Gastrointestinal: Negative.  Negative for abdominal pain.  Genitourinary: Negative.  Negative for dysuria and hematuria.  Musculoskeletal: Negative.  Negative for back pain.  Skin: Negative.  Negative for rash.  Neurological: Negative.  Negative for dizziness, sensory change, focal weakness, weakness and headaches.  Psychiatric/Behavioral: Negative.  The patient is not nervous/anxious.    As per HPI. Otherwise, a complete review of systems is negative.  PAST MEDICAL HISTORY: Past Medical History:  Diagnosis Date   Arthritis    lower back    Cancer [facility])    prostate cancer    Coronary artery disease    stent RCA- 2000 and 12/2009    GERD (gastroesophageal reflux disease)    Hyperlipidemia    Hypertension    Skin cancer     PAST SURGICAL  HISTORY: Past Surgical History:  Procedure Laterality Date   APPENDECTOMY     COLONOSCOPY WITH PROPOFOL N/A 07/18/2017   Procedure: COLONOSCOPY WITH PROPOFOL;  Surgeon: [name], MD;  Location: [facility] ENDOSCOPY;  Service: Endoscopy;  Laterality: N/A;   CORONARY ANGIOPLASTY     coronary stents      x 2   KNEE ARTHROPLASTY Left 07/20/2017   Procedure: COMPUTER ASSISTED TOTAL KNEE ARTHROPLASTY;  Surgeon: [name], MD;  Location: [facility] ORS;  Service: Orthopedics;  Laterality: Left;   KNEE ARTHROPLASTY Right 01/23/2020   Procedure: COMPUTER ASSISTED TOTAL KNEE ARTHROPLASTY;  Surgeon: [name], MD;  Location: [facility] ORS;  Service: Orthopedics;  Laterality: Right;   ROBOT ASSISTED LAPAROSCOPIC RADICAL PROSTATECTOMY N/A 07/16/2013   Procedure: ROBOTIC ASSISTED LAPAROSCOPIC RADICAL PROSTATECTOMY LEVEL 2, bilateral pelvic lymphadenectomy;  Surgeon: [name], MD;  Location: [facility] ORS;  Service: Urology;  Laterality: N/A;   TONSILLECTOMY     and adenoidectomy     FAMILY HISTORY: Family History  Problem Relation Age of Onset   Cancer Mother 75       cervical cancer   Heart attack Father    Thyroid cancer Daughter 64       doing well    ADVANCED DIRECTIVES (Y/N):  N  HEALTH MAINTENANCE: Social History   Tobacco Use   Smoking status: Former    Packs/day: 1.00    Years: 30.00    Pack years: 30.00    Types: Cigarettes    Quit date: 11/01/1992    Years since quitting: 28.7   Smokeless tobacco: Never  Vaping Use   Vaping Use: Never used  Substance Use Topics   Alcohol use: No   Drug use: No     Colonoscopy:  PAP:  Bone density:  Lipid panel:  Allergies  Allergen Reactions   Ampicillin Other (See Comments)    Raw mouth and throat    Atorvastatin     Other reaction(s): Unknown   Carvedilol     Other reaction(s): Dizziness   Contrast Media [Iodinated Diagnostic Agents] Hives   Diltiazem Hcl Other (See Comments)   Fluvastatin Other (See Comments)   Lovastatin  Other (See Comments)   Sulfa Antibiotics Hives   Betadine [Povidone Iodine] Rash   Celecoxib Palpitations    Current Outpatient Medications  Medication Sig Dispense Refill   acetaminophen (TYLENOL) 500 MG tablet Take 1,000 mg by mouth 2 (two) times daily as needed.      atenolol (TENORMIN) 100 MG tablet Take 100 mg by mouth every morning.     b complex vitamins capsule Take 1 capsule by mouth daily.     Calcium Carbonate-Vit D-Min (CALTRATE 600+D PLUS PO) Take 1 tablet by mouth daily.      Coenzyme Q10 (COQ10) 100 MG CAPS Take 100 mg by mouth at bedtime.      felodipine (PLENDIL) 10 MG 24 hr tablet Take 10 mg by mouth at bedtime.      folic acid (FOLVITE) 270 MCG tablet Take 800 mcg by mouth at bedtime.      Inositol Niacinate (NIACIN FLUSH FREE) 500 MG CAPS Take 1,000 mg by mouth in the morning and at bedtime.     Leuprolide Acetate, 6 Month, (LUPRON DEPOT, 64-MONTH,) 45 MG injection Inject 45 mg into the muscle every 6 (six) months.     lisinopril-hydrochlorothiazide (PRINZIDE,ZESTORETIC) 20-12.5 MG per tablet Take 1 tablet by mouth every morning.     omeprazole (PRILOSEC) 20 MG capsule Take 20 mg by mouth daily.     rosuvastatin (CRESTOR) 10 MG tablet Take 10 mg by mouth daily.     triamcinolone (NASACORT) 55 MCG/ACT AERO nasal inhaler Place 2 sprays into the nose daily.      vitamin B-12 (CYANOCOBALAMIN) 1000 MCG tablet Take 1,000 mcg by mouth daily.     vitamin E 400 UNIT capsule Take 400 Units by mouth daily.     enzalutamide (XTANDI) 40 MG tablet Take 4 tablets (160 mg total) by mouth daily. 120 tablet 2   No current facility-administered medications for this visit.   Facility-Administered Medications Ordered in Other Visits  Medication Dose Route Frequency Provider Last Rate Last Admin   Leuprolide Acetate (6 Month) (LUPRON) injection 45 mg  45 mg Intramuscular Once Lloyd Huger, MD        OBJECTIVE: Vitals:   07/30/21 1410  BP: 117/80  Pulse: 61  Resp: 18  Temp:  97.7 F (36.5 C)  SpO2: 98%     Body mass index is 31.29 kg/m.    ECOG FS:0 - Asymptomatic  General: Well-developed, well-nourished, no acute distress. Eyes: Pink conjunctiva, anicteric sclera. HEENT: Normocephalic, moist mucous membranes. Lungs: No audible wheezing or coughing. Heart: Regular rate and rhythm. Abdomen: Soft, nontender, no obvious distention. Musculoskeletal: No edema, cyanosis, or clubbing. Neuro: Alert, answering all questions appropriately. Cranial nerves grossly intact. Skin: No rashes or petechiae noted. Psych: Normal affect.   LAB RESULTS:  Lab Results  Component Value Date   NA 137 07/29/2021   K 3.9 07/29/2021   CL 101 07/29/2021   CO2 26 07/29/2021  GLUCOSE 132 (H) 07/29/2021   BUN 22 07/29/2021   CREATININE 1.24 07/29/2021   CALCIUM 9.4 07/29/2021   PROT 6.7 01/15/2020   ALBUMIN 4.1 01/15/2020   AST 23 01/15/2020   ALT 32 01/15/2020   ALKPHOS 71 01/15/2020   BILITOT 1.3 (H) 01/15/2020   GFRNONAA >60 07/29/2021   GFRAA >60 01/15/2020    Lab Results  Component Value Date   WBC 6.7 01/15/2020   HGB 13.6 01/15/2020   HCT 39.9 01/15/2020   MCV 91.7 01/15/2020   PLT 194 01/15/2020     STUDIES: No results found.   ONCOLOGY TREATMENT HISTORY: Patient underwent radical prostatectomy on July 06, 2013.  Final pathology reported Gleason 7 (4+3), extracapsular extension, but margins were clear.  Seminal vesicles were not involved and 0 of 8 lymph nodes were negative for disease.  Patient noted to have a rising PSA and underwent salvage XRT in Spring of 2017.  Nuclear med bone scan on May 30, 2017 revealed improvement of uptake in the right posterior inferior pubic ramus and distal left femoral metadiaphysis.  PET scan on October 31, 2018 revealed no evidence of disease.  Patient reinitiated Lupron/Eligard on November 02, 2018.  He subsequently initiated Xtandi in June 2022.   ASSESSMENT: Stage IIIb prostate cancer  PLAN:    1.  Stage IIIb  prostate cancer: Patient's PSA has recently tripled over a 69-month timeframe from from 21.43-65.34.  Despite this, F18-PYLARIFY PET scan on Mar 11, 2021 revealed no evidence of disease.  Patient subsequently initiated Xtandi in June 2022.  He also continues with Eligard last receiving treatment on Mar 12, 2025.  His PSA has now significantly declined to 0.39.  No further intervention is needed at this time.  Return to clinic in November 2022 for further evaluations and his next dose of Eligard at which point patient can be seen and evaluated every 3 months.  Appreciate clinical pharmacy input.   2.  Renal insufficiency: Resolved.  Patient creatinine is 1.24 today. Estimated bilirubin: Mild, monitor.  I spent a total of 30 minutes reviewing chart data, face-to-face evaluation with the patient, counseling and coordination of care as detailed above.    Patient expressed understanding and was in agreement with this plan. He also understands that He can call clinic at any time with any questions, concerns, or complaints.   Cancer Staging Prostate cancer [facility]) Staging form: Prostate, AJCC 8th Edition - Clinical stage from 07/08/2018: Stage IIIB (cT3a, cN0, cM0, Grade Group: 3) - Signed by Lloyd Huger, MD on 07/08/2018 Gleason score: 7 Histologic grading system: 5 grade system   Lloyd Huger, MD   08/02/2021 7:23 AM

## 2021-07-29 ENCOUNTER — Inpatient Hospital Stay: Payer: Medicare Other | Attending: Oncology

## 2021-07-29 ENCOUNTER — Other Ambulatory Visit: Payer: Self-pay

## 2021-07-29 DIAGNOSIS — C61 Malignant neoplasm of prostate: Secondary | ICD-10-CM | POA: Diagnosis not present

## 2021-07-29 DIAGNOSIS — Z87891 Personal history of nicotine dependence: Secondary | ICD-10-CM | POA: Diagnosis not present

## 2021-07-29 LAB — BASIC METABOLIC PANEL
Anion gap: 10 (ref 5–15)
BUN: 22 mg/dL (ref 8–23)
CO2: 26 mmol/L (ref 22–32)
Calcium: 9.4 mg/dL (ref 8.9–10.3)
Chloride: 101 mmol/L (ref 98–111)
Creatinine, Ser: 1.24 mg/dL (ref 0.61–1.24)
GFR, Estimated: >60 mL/min (ref >60)
Glucose, Bld: 132 mg/dL — ABNORMAL HIGH (ref 70–99)
Potassium: 3.9 mmol/L (ref 3.5–5.1)
Sodium: 137 mmol/L (ref 135–145)

## 2021-07-29 LAB — PSA: Prostatic Specific Antigen: 0.39 ng/mL (ref 0.00–4.00)

## 2021-07-30 ENCOUNTER — Inpatient Hospital Stay: Payer: Medicare Other | Admitting: Pharmacist

## 2021-07-30 ENCOUNTER — Inpatient Hospital Stay (HOSPITAL_BASED_OUTPATIENT_CLINIC_OR_DEPARTMENT_OTHER): Payer: Medicare Other | Admitting: Oncology

## 2021-07-30 VITALS — BP 117/80 | HR 61 | Temp 97.7°F | Resp 18 | Wt 211.9 lb

## 2021-07-30 DIAGNOSIS — C61 Malignant neoplasm of prostate: Secondary | ICD-10-CM

## 2021-07-30 LAB — TESTOSTERONE: Testosterone: 3 ng/dL — ABNORMAL LOW (ref 264–916)

## 2021-07-30 MED ORDER — ENZALUTAMIDE 40 MG PO TABS: 160.0000 mg | ORAL_TABLET | Freq: Every day | ORAL | 2 refills | Status: DC

## 2021-07-30 NOTE — Progress Notes (Signed)
[place]  [phone] [phone]  Patient Care Team: [name], MD as PCP - General (Internal Medicine)   Name of the patient: Cody Ellison  [id number]  01/26/1951   Date of visit: 07/30/21  HPI: Patient is a 69 y.o. male with non-metastatic castration resistant prostate cancer. Patient started his Gillermina Phy (enzalutamide) on 04/07/21.  Reason for Consult: Oral chemotherapy follow-up for Xtandi (enzalutamide) therapy.   PAST MEDICAL HISTORY: Past Medical History:  Diagnosis Date   Arthritis    lower back    Cancer [facility])    prostate cancer    Coronary artery disease    stent RCA- 2000 and 12/2009    GERD (gastroesophageal reflux disease)    Hyperlipidemia    Hypertension    Skin cancer     HEMATOLOGY/ONCOLOGY HISTORY:  Oncology History  Prostate cancer ([place])  07/03/2018 Initial Diagnosis   Prostate cancer ([place])   07/08/2018 Cancer Staging   Staging form: Prostate, AJCC 8th Edition - Clinical stage from 07/08/2018: Stage IIIB (cT3a, cN0, cM0, Grade Group: 3) - Signed by Lloyd Huger, MD on 07/08/2018     ALLERGIES:  is allergic to ampicillin, atorvastatin, carvedilol, contrast media [iodinated diagnostic agents], diltiazem hcl, fluvastatin, lovastatin, sulfa antibiotics, betadine [povidone iodine], and celecoxib.  MEDICATIONS:  Current Outpatient Medications  Medication Sig Dispense Refill   acetaminophen (TYLENOL) 500 MG tablet Take 1,000 mg by mouth 2 (two) times daily as needed.      atenolol (TENORMIN) 100 MG tablet Take 100 mg by mouth every morning.     b complex vitamins capsule Take 1 capsule by mouth daily.     Calcium Carbonate-Vit D-Min (CALTRATE 600+D PLUS PO) Take 1 tablet by mouth daily.      Coenzyme Q10 (COQ10) 100 MG CAPS Take 100 mg by mouth at bedtime.      felodipine (PLENDIL) 10 MG 24 hr tablet Take 10 mg by mouth at bedtime.      folic acid (FOLVITE) 542 MCG tablet Take  800 mcg by mouth at bedtime.      Inositol Niacinate (NIACIN FLUSH FREE) 500 MG CAPS Take 1,000 mg by mouth in the morning and at bedtime.     Leuprolide Acetate, 6 Month, (LUPRON DEPOT, 45-MONTH,) 45 MG injection Inject 45 mg into the muscle every 6 (six) months.     lisinopril-hydrochlorothiazide (PRINZIDE,ZESTORETIC) 20-12.5 MG per tablet Take 1 tablet by mouth every morning.     omeprazole (PRILOSEC) 20 MG capsule Take 20 mg by mouth daily.     rosuvastatin (CRESTOR) 10 MG tablet Take 10 mg by mouth daily.     triamcinolone (NASACORT) 55 MCG/ACT AERO nasal inhaler Place 2 sprays into the nose daily.      vitamin B-12 (CYANOCOBALAMIN) 1000 MCG tablet Take 1,000 mcg by mouth daily.     vitamin E 400 UNIT capsule Take 400 Units by mouth daily.     XTANDI 40 MG tablet Take 4 tablets (160mg ) by mouth once daily as directed by physician. 120 tablet 1   No current facility-administered medications for this visit.   Facility-Administered Medications Ordered in Other Visits  Medication Dose Route Frequency Provider Last Rate Last Admin   Leuprolide Acetate (6 Month) (LUPRON) injection 45 mg  45 mg Intramuscular Once Lloyd Huger, MD        VITAL SIGNS: There were no vitals taken for this visit. There were no vitals filed for this visit.  Estimated body  mass index is 30.8 kg/m as calculated from the following:   Height as of 09/05/20: 5\' 9"  (1.753 m).   Weight as of 07/16/21: 94.6 kg (208 lb 9.6 oz).  LABS: CBC:    Component Value Date/Time   WBC 6.7 01/15/2020 1317   HGB 13.6 01/15/2020 1317   HCT 39.9 01/15/2020 1317   PLT 194 01/15/2020 1317   MCV 91.7 01/15/2020 1317   Comprehensive Metabolic Panel:    Component Value Date/Time   NA 137 07/29/2021 1106   K 3.9 07/29/2021 1106   CL 101 07/29/2021 1106   CO2 26 07/29/2021 1106   BUN 22 07/29/2021 1106   CREATININE 1.24 07/29/2021 1106   GLUCOSE 132 (H) 07/29/2021 1106   CALCIUM 9.4 07/29/2021 1106   AST 23 01/15/2020  1317   ALT 32 01/15/2020 1317   ALKPHOS 71 01/15/2020 1317   BILITOT 1.3 (H) 01/15/2020 1317   PROT 6.7 01/15/2020 1317   ALBUMIN 4.1 01/15/2020 1317     Present during today's visit: Patient only  Assessment and Plan: Continue enzalutamide 160mg  daily   Oral Chemotherapy Side Effect/Intolerance:  Constipation: He reports using milk of magnesium a few times a week when his constipation gets back and his stools are hard. Encouraged patient to try Miralax daily (starting with a half cup) to help prevent the constipation and urgent need to milk of magnesium  No reported edema or home HTN  Oral Chemotherapy Adherence: no issues,  No patient barriers to medication adherence identified.   New medications: no reported  Medication Access Issues: Patient fills using manufacturer assistance, refill prescription sent to [place].   Patient expressed understanding and was in agreement with this plan. He also understands that He can call clinic at any time with any questions, concerns, or complaints.   Follow-up plan: RTC in 2 months  Thank you for allowing me to participate in the care of this very pleasant patient.   Time Total: 15 mins  Visit consisted of counseling and education on dealing with issues of symptom management in the setting of serious and potentially life-threatening illness.Greater than 50%  of this time was spent counseling and coordinating care related to the above assessment and plan.  Signed by: Darl Pikes, PharmD, BCPS, Salley Slaughter, CPP Hematology/Oncology Clinical Pharmacist Practitioner [facility] [phone]  07/30/2021 12:55 PM

## 2021-07-30 NOTE — Progress Notes (Signed)
Pt has no concerns at this time. 

## 2021-08-02 ENCOUNTER — Encounter: Payer: Self-pay | Admitting: Oncology

## 2021-08-28 ENCOUNTER — Other Ambulatory Visit: Payer: Self-pay | Admitting: Oncology

## 2021-08-28 DIAGNOSIS — C61 Malignant neoplasm of prostate: Secondary | ICD-10-CM

## 2021-08-31 ENCOUNTER — Encounter: Payer: Self-pay | Admitting: Oncology

## 2021-09-03 ENCOUNTER — Telehealth: Payer: Self-pay | Admitting: Pharmacy Technician

## 2021-09-03 NOTE — Telephone Encounter (Signed)
Oral Oncology Patient Advocate Encounter  Received communication from [employer] that the patient's eligibility in the patient assistance program was due for re-enrollment.  The renewal application has been completed and faxed in an effort to keep the patient's out of pocket expense for Xtandi at $0.     Completed re-enrollment form was uploaded to the [place] phone number for follow up is [phone].    This encounter will be updated until final determination.   [place] Patient [place] Phone [phone] Fax [phone] 09/03/2021 1:53 PM

## 2021-09-28 ENCOUNTER — Other Ambulatory Visit: Payer: Self-pay

## 2021-09-28 ENCOUNTER — Other Ambulatory Visit: Payer: Self-pay | Admitting: Emergency Medicine

## 2021-09-28 ENCOUNTER — Inpatient Hospital Stay: Payer: Medicare Other | Attending: Oncology

## 2021-09-28 DIAGNOSIS — Z79899 Other long term (current) drug therapy: Secondary | ICD-10-CM | POA: Diagnosis not present

## 2021-09-28 DIAGNOSIS — C61 Malignant neoplasm of prostate: Secondary | ICD-10-CM | POA: Insufficient documentation

## 2021-09-28 DIAGNOSIS — Z5111 Encounter for antineoplastic chemotherapy: Secondary | ICD-10-CM | POA: Insufficient documentation

## 2021-09-28 LAB — PSA: Prostatic Specific Antigen: 0.21 ng/mL (ref 0.00–4.00)

## 2021-09-28 LAB — BASIC METABOLIC PANEL
Anion gap: 7 (ref 5–15)
BUN: 20 mg/dL (ref 8–23)
CO2: 29 mmol/L (ref 22–32)
Calcium: 9.8 mg/dL (ref 8.9–10.3)
Chloride: 103 mmol/L (ref 98–111)
Creatinine, Ser: 1.19 mg/dL (ref 0.61–1.24)
GFR, Estimated: >60 mL/min (ref >60)
Glucose, Bld: 110 mg/dL — ABNORMAL HIGH (ref 70–99)
Potassium: 4.1 mmol/L (ref 3.5–5.1)
Sodium: 139 mmol/L (ref 135–145)

## 2021-09-28 NOTE — Progress Notes (Signed)
[place]  [phone] [phone]  ID: Cody Spry Sr. OB: 01-18-1951  MR#: [id number]  BLT#:[id number]  Patient Care Team: [name], MD as PCP - General (Internal Medicine)  CHIEF COMPLAINT: Stage IIIb prostate cancer  INTERVAL HISTORY: Patient returns to clinic today for repeat laboratory work, further evaluation, and continuation of Xtandi and [place].  He continues to feel well and remains asymptomatic.  He is tolerating his treatments without significant side effects.  He denies any pain.  He has no neurologic complaints.  He denies any recent fevers or illnesses.  He has a good appetite and denies weight loss.  He denies any chest pain, shortness of breath, cough, or hemoptysis.  He denies any nausea, vomiting, constipation, or diarrhea.  He has no urinary complaints.  Patient offers no specific complaints today.  REVIEW OF SYSTEMS:   Review of Systems  Constitutional: Negative.  Negative for fever, malaise/fatigue and weight loss.  Respiratory: Negative.  Negative for cough, hemoptysis and shortness of breath.   Cardiovascular: Negative.  Negative for chest pain and leg swelling.  Gastrointestinal: Negative.  Negative for abdominal pain.  Genitourinary: Negative.  Negative for dysuria and hematuria.  Musculoskeletal: Negative.  Negative for back pain.  Skin: Negative.  Negative for rash.  Neurological: Negative.  Negative for dizziness, sensory change, focal weakness, weakness and headaches.  Psychiatric/Behavioral: Negative.  The patient is not nervous/anxious.    As per HPI. Otherwise, a complete review of systems is negative.  PAST MEDICAL HISTORY: Past Medical History:  Diagnosis Date   Arthritis    lower back    Cancer [facility])    prostate cancer    Coronary artery disease    stent RCA- 2000 and 12/2009    GERD (gastroesophageal reflux disease)    Hyperlipidemia    Hypertension    Skin cancer     PAST SURGICAL  HISTORY: Past Surgical History:  Procedure Laterality Date   APPENDECTOMY     COLONOSCOPY WITH PROPOFOL N/A 07/18/2017   Procedure: COLONOSCOPY WITH PROPOFOL;  Surgeon: [name], MD;  Location: [facility] ENDOSCOPY;  Service: Endoscopy;  Laterality: N/A;   CORONARY ANGIOPLASTY     coronary stents      x 2   KNEE ARTHROPLASTY Left 07/20/2017   Procedure: COMPUTER ASSISTED TOTAL KNEE ARTHROPLASTY;  Surgeon: [name], MD;  Location: [facility] ORS;  Service: Orthopedics;  Laterality: Left;   KNEE ARTHROPLASTY Right 01/23/2020   Procedure: COMPUTER ASSISTED TOTAL KNEE ARTHROPLASTY;  Surgeon: [name], MD;  Location: [facility] ORS;  Service: Orthopedics;  Laterality: Right;   ROBOT ASSISTED LAPAROSCOPIC RADICAL PROSTATECTOMY N/A 07/16/2013   Procedure: ROBOTIC ASSISTED LAPAROSCOPIC RADICAL PROSTATECTOMY LEVEL 2, bilateral pelvic lymphadenectomy;  Surgeon: [name], MD;  Location: [facility] ORS;  Service: Urology;  Laterality: N/A;   TONSILLECTOMY     and adenoidectomy     FAMILY HISTORY: Family History  Problem Relation Age of Onset   Cancer Mother 28       cervical cancer   Heart attack Father    Thyroid cancer Daughter 82       doing well    ADVANCED DIRECTIVES (Y/N):  N  HEALTH MAINTENANCE: Social History   Tobacco Use   Smoking status: Former    Packs/day: 1.00    Years: 30.00    Pack years: 30.00    Types: Cigarettes    Quit date: 11/01/1992    Years since quitting: 28.9   Smokeless tobacco: Never  Vaping Use   Vaping Use: Never used  Substance Use Topics   Alcohol use: No   Drug use: No     Colonoscopy:  PAP:  Bone density:  Lipid panel:  Allergies  Allergen Reactions   Ampicillin Other (See Comments)    Raw mouth and throat    Atorvastatin     Other reaction(s): Unknown   Carvedilol     Other reaction(s): Dizziness   Contrast Media [Iodinated Diagnostic Agents] Hives   Diltiazem Hcl Other (See Comments)   Fluvastatin Other (See Comments)   Lovastatin  Other (See Comments)   Sulfa Antibiotics Hives   Betadine [Povidone Iodine] Rash   Celecoxib Palpitations    Current Outpatient Medications  Medication Sig Dispense Refill   acetaminophen (TYLENOL) 500 MG tablet Take 1,000 mg by mouth 2 (two) times daily as needed.      atenolol (TENORMIN) 100 MG tablet Take 100 mg by mouth every morning.     b complex vitamins capsule Take 1 capsule by mouth daily.     Calcium Carbonate-Vit D-Min (CALTRATE 600+D PLUS PO) Take 1 tablet by mouth daily.      Coenzyme Q10 (COQ10) 100 MG CAPS Take 100 mg by mouth at bedtime.      felodipine (PLENDIL) 10 MG 24 hr tablet Take 10 mg by mouth at bedtime.      fluticasone (FLONASE) 50 MCG/ACT nasal spray SPRAY 2 SPRAYS INTO EACH NOSTRIL EVERY DAY     folic acid (FOLVITE) 254 MCG tablet Take 800 mcg by mouth at bedtime.      Inositol Niacinate (NIACIN FLUSH FREE) 500 MG CAPS Take 1,000 mg by mouth in the morning and at bedtime.     Leuprolide Acetate, 6 Month, (LUPRON DEPOT, 20-MONTH,) 45 MG injection Inject 45 mg into the muscle every 6 (six) months.     lisinopril-hydrochlorothiazide (PRINZIDE,ZESTORETIC) 20-12.5 MG per tablet Take 1 tablet by mouth every morning.     omeprazole (PRILOSEC) 20 MG capsule Take 20 mg by mouth daily.     rosuvastatin (CRESTOR) 10 MG tablet Take 10 mg by mouth daily.     triamcinolone (NASACORT) 55 MCG/ACT AERO nasal inhaler Place 2 sprays into the nose daily.      vitamin B-12 (CYANOCOBALAMIN) 1000 MCG tablet Take 1,000 mcg by mouth daily.     vitamin E 400 UNIT capsule Take 400 Units by mouth daily.     XTANDI 40 MG tablet Take 4 tablets (160mg ) by mouth once daily as directed by physician. 120 tablet 0   No current facility-administered medications for this visit.   Facility-Administered Medications Ordered in Other Visits  Medication Dose Route Frequency Provider Last Rate Last Admin   Leuprolide Acetate (6 Month) (LUPRON) injection 45 mg  45 mg Intramuscular Once Lloyd Huger, MD        OBJECTIVE: Vitals:   09/29/21 1042  BP: 108/75  Pulse: 62  Resp: 16  Temp: (!) 97.3 F (36.3 C)  SpO2: 99%     Body mass index is 31.23 kg/m.    ECOG FS:0 - Asymptomatic  General: Well-developed, well-nourished, no acute distress. Eyes: Pink conjunctiva, anicteric sclera. HEENT: Normocephalic, moist mucous membranes. Lungs: No audible wheezing or coughing. Heart: Regular rate and rhythm. Abdomen: Soft, nontender, no obvious distention. Musculoskeletal: No edema, cyanosis, or clubbing. Neuro: Alert, answering all questions appropriately. Cranial nerves grossly intact. Skin: No rashes or petechiae noted. Psych: Normal affect.   LAB RESULTS:  Lab Results  Component Value  Date   NA 139 09/28/2021   K 4.1 09/28/2021   CL 103 09/28/2021   CO2 29 09/28/2021   GLUCOSE 110 (H) 09/28/2021   BUN 20 09/28/2021   CREATININE 1.19 09/28/2021   CALCIUM 9.8 09/28/2021   PROT 6.7 01/15/2020   ALBUMIN 4.1 01/15/2020   AST 23 01/15/2020   ALT 32 01/15/2020   ALKPHOS 71 01/15/2020   BILITOT 1.3 (H) 01/15/2020   GFRNONAA >60 09/28/2021   GFRAA >60 01/15/2020    Lab Results  Component Value Date   WBC 6.7 01/15/2020   HGB 13.6 01/15/2020   HCT 39.9 01/15/2020   MCV 91.7 01/15/2020   PLT 194 01/15/2020     STUDIES: No results found.   ONCOLOGY TREATMENT HISTORY: Patient underwent radical prostatectomy on July 06, 2013.  Final pathology reported Gleason 7 (4+3), extracapsular extension, but margins were clear.  Seminal vesicles were not involved and 0 of 8 lymph nodes were negative for disease.  Patient noted to have a rising PSA and underwent salvage XRT in Spring of 2017.  Nuclear med bone scan on May 30, 2017 revealed improvement of uptake in the right posterior inferior pubic ramus and distal left femoral metadiaphysis.  PET scan on October 31, 2018 revealed no evidence of disease.  Patient reinitiated Lupron/Eligard on November 02, 2018.  He  subsequently initiated Xtandi in June 2022.   ASSESSMENT: Stage IIIb prostate cancer  PLAN:    1.  Stage IIIb prostate cancer: Patient's PSA has recently tripled over a 77-month timeframe from from 21.43-65.34.  Despite this, F18-PYLARIFY PET scan on Mar 11, 2021 revealed no evidence of disease.  Patient subsequently initiated Xtandi in June 2022.  He also continues with Eligard last receiving treatment on Mar 12, 2021.  His PSA continues to trend down and is now 0.21.  Continue Xtandi and proceed with Eligard today.  Return to clinic in 3 months for laboratory work and evaluation and then in 6 months for laboratory work, further evaluation, and continuation of [place].   2.  Renal insufficiency: Resolved.  Patient's creatinine is 1.19.   I spent a total of 30 minutes reviewing chart data, face-to-face evaluation with the patient, counseling and coordination of care as detailed above.    Patient expressed understanding and was in agreement with this plan. He also understands that He can call clinic at any time with any questions, concerns, or complaints.    Cancer Staging  Prostate cancer [facility]) Staging form: Prostate, AJCC 8th Edition - Clinical stage from 07/08/2018: Stage IIIB (cT3a, cN0, cM0, Grade Group: 3) - Signed by Lloyd Huger, MD on 07/08/2018 Gleason score: 7 Histologic grading system: 5 grade system   Lloyd Huger, MD   09/29/2021 12:32 PM

## 2021-09-29 ENCOUNTER — Inpatient Hospital Stay (HOSPITAL_BASED_OUTPATIENT_CLINIC_OR_DEPARTMENT_OTHER): Payer: Medicare Other | Admitting: Oncology

## 2021-09-29 ENCOUNTER — Inpatient Hospital Stay: Payer: Medicare Other

## 2021-09-29 ENCOUNTER — Inpatient Hospital Stay: Payer: Medicare Other | Admitting: Pharmacist

## 2021-09-29 VITALS — BP 108/75 | HR 62 | Temp 97.3°F | Resp 16 | Wt 211.5 lb

## 2021-09-29 DIAGNOSIS — Z5111 Encounter for antineoplastic chemotherapy: Secondary | ICD-10-CM | POA: Diagnosis not present

## 2021-09-29 DIAGNOSIS — C61 Malignant neoplasm of prostate: Secondary | ICD-10-CM

## 2021-09-29 LAB — TESTOSTERONE: Testosterone: 21 ng/dL — ABNORMAL LOW (ref 264–916)

## 2021-09-29 MED ORDER — LEUPROLIDE ACETATE (6 MONTH) 45 MG ~~LOC~~ KIT
45.0000 mg | PACK | Freq: Once | SUBCUTANEOUS | Status: AC
  Administered 2021-09-29: 45 mg via SUBCUTANEOUS

## 2021-09-29 NOTE — Progress Notes (Signed)
Pt has no concerns at this time. 

## 2021-10-22 ENCOUNTER — Other Ambulatory Visit (HOSPITAL_COMMUNITY): Payer: Self-pay

## 2021-10-22 ENCOUNTER — Other Ambulatory Visit: Payer: Self-pay | Admitting: Pharmacist

## 2021-10-22 DIAGNOSIS — C61 Malignant neoplasm of prostate: Secondary | ICD-10-CM

## 2021-10-22 MED ORDER — ENZALUTAMIDE 40 MG PO TABS: 160.0000 mg | ORAL_TABLET | Freq: Every day | ORAL | 2 refills | Status: DC

## 2021-11-03 ENCOUNTER — Other Ambulatory Visit (HOSPITAL_COMMUNITY): Payer: Self-pay

## 2021-12-07 NOTE — Telephone Encounter (Signed)
Oral Oncology Patient Advocate Encounter  Received notification from [facility] that patient has been successfully enrolled into their program to receive Xtandi from the manufacturer at $0 out of pocket until 10/31/22.    I called and spoke with patient.  He knows we will have to re-apply.   Specialty Pharmacy that will dispense medication is [facility].  Patient knows to call the office with questions or concerns.   Oral Oncology Clinic will continue to follow.  [place] [facility] Phone [phone] Fax [phone] 12/07/2021 12:22 PM

## 2021-12-22 ENCOUNTER — Other Ambulatory Visit: Payer: Self-pay | Admitting: *Deleted

## 2021-12-22 DIAGNOSIS — C61 Malignant neoplasm of prostate: Secondary | ICD-10-CM

## 2021-12-25 NOTE — Progress Notes (Signed)
[place]  [phone]) [phone] [phone]) [phone]  ID: Cody Spry Sr. OB: 1951/08/15  MR#: [id number]  XHB#:[id number]  Patient Care Team: [name], MD as PCP - General (Internal Medicine)  CHIEF COMPLAINT: Stage IIIb prostate cancer  INTERVAL HISTORY: Patient returns to clinic today for repeat laboratory work, further evaluation, and continuation of Xtandi.  He has chronic weakness and fatigue from his treatment, but otherwise feels well.  He denies any pain. He has no neurologic complaints.  He denies any recent fevers or illnesses.  He has a good appetite and denies weight loss.  He denies any chest pain, shortness of breath, cough, or hemoptysis.  He denies any nausea, vomiting, constipation, or diarrhea.  He has no urinary complaints.  Patient offers no further specific complaints today.  REVIEW OF SYSTEMS:   Review of Systems  Constitutional:  Positive for malaise/fatigue. Negative for fever and weight loss.  Respiratory: Negative.  Negative for cough, hemoptysis and shortness of breath.   Cardiovascular: Negative.  Negative for chest pain and leg swelling.  Gastrointestinal: Negative.  Negative for abdominal pain.  Genitourinary: Negative.  Negative for dysuria and hematuria.  Musculoskeletal: Negative.  Negative for back pain.  Skin: Negative.  Negative for rash.  Neurological:  Positive for weakness. Negative for dizziness, sensory change, focal weakness and headaches.  Psychiatric/Behavioral: Negative.  The patient is not nervous/anxious.    As per HPI. Otherwise, a complete review of systems is negative.  PAST MEDICAL HISTORY: Past Medical History:  Diagnosis Date   Arthritis    lower back    Cancer [facility])    prostate cancer    Coronary artery disease    stent RCA- 2000 and 12/2009    GERD (gastroesophageal reflux disease)    Hyperlipidemia    Hypertension    Skin cancer     PAST SURGICAL HISTORY: Past Surgical History:   Procedure Laterality Date   APPENDECTOMY     COLONOSCOPY WITH PROPOFOL N/A 07/18/2017   Procedure: COLONOSCOPY WITH PROPOFOL;  Surgeon: [name], MD;  Location: [facility] ENDOSCOPY;  Service: Endoscopy;  Laterality: N/A;   CORONARY ANGIOPLASTY     coronary stents      x 2   KNEE ARTHROPLASTY Left 07/20/2017   Procedure: COMPUTER ASSISTED TOTAL KNEE ARTHROPLASTY;  Surgeon: [name], MD;  Location: [facility] ORS;  Service: Orthopedics;  Laterality: Left;   KNEE ARTHROPLASTY Right 01/23/2020   Procedure: COMPUTER ASSISTED TOTAL KNEE ARTHROPLASTY;  Surgeon: [name], MD;  Location: [facility] ORS;  Service: Orthopedics;  Laterality: Right;   ROBOT ASSISTED LAPAROSCOPIC RADICAL PROSTATECTOMY N/A 07/16/2013   Procedure: ROBOTIC ASSISTED LAPAROSCOPIC RADICAL PROSTATECTOMY LEVEL 2, bilateral pelvic lymphadenectomy;  Surgeon: [name], MD;  Location: [facility] ORS;  Service: Urology;  Laterality: N/A;   TONSILLECTOMY     and adenoidectomy     FAMILY HISTORY: Family History  Problem Relation Age of Onset   Cancer Mother 31       cervical cancer   Heart attack Father    Thyroid cancer Daughter 65       doing well    ADVANCED DIRECTIVES (Y/N):  N  HEALTH MAINTENANCE: Social History   Tobacco Use   Smoking status: Former    Packs/day: 1.00    Years: 30.00    Pack years: 30.00    Types: Cigarettes    Quit date: 11/01/1992    Years since quitting: 29.1   Smokeless tobacco: Never  Vaping Use  Vaping Use: Never used  Substance Use Topics   Alcohol use: No   Drug use: No     Colonoscopy:  PAP:  Bone density:  Lipid panel:  Allergies  Allergen Reactions   Ampicillin Other (See Comments)    Raw mouth and throat    Atorvastatin     Other reaction(s): Unknown   Carvedilol     Other reaction(s): Dizziness   Contrast Media [Iodinated Contrast Media] Hives   Diltiazem Hcl Other (See Comments)   Fluvastatin Other (See Comments)   Lovastatin Other (See Comments)   Sulfa  Antibiotics Hives   Betadine [Povidone Iodine] Rash   Celecoxib Palpitations    Current Outpatient Medications  Medication Sig Dispense Refill   acetaminophen (TYLENOL) 500 MG tablet Take 1,000 mg by mouth 2 (two) times daily as needed.      atenolol (TENORMIN) 100 MG tablet Take 100 mg by mouth every morning.     b complex vitamins capsule Take 1 capsule by mouth daily.     Calcium Carbonate-Vit D-Min (CALTRATE 600+D PLUS PO) Take 1 tablet by mouth daily.      Coenzyme Q10 (COQ10) 100 MG CAPS Take 100 mg by mouth at bedtime.      enzalutamide (XTANDI) 40 MG tablet Take 4 tablets (160 mg total) by mouth daily. 120 tablet 2   felodipine (PLENDIL) 10 MG 24 hr tablet Take 10 mg by mouth at bedtime.      fluticasone (FLONASE) 50 MCG/ACT nasal spray SPRAY 2 SPRAYS INTO EACH NOSTRIL EVERY DAY     folic acid (FOLVITE) 295 MCG tablet Take 800 mcg by mouth at bedtime.      Inositol Niacinate (NIACIN FLUSH FREE) 500 MG CAPS Take 1,000 mg by mouth in the morning and at bedtime.     Leuprolide Acetate, 6 Month, (LUPRON DEPOT, 76-MONTH,) 45 MG injection Inject 45 mg into the muscle every 6 (six) months.     lisinopril-hydrochlorothiazide (PRINZIDE,ZESTORETIC) 20-12.5 MG per tablet Take 1 tablet by mouth every morning.     omeprazole (PRILOSEC) 20 MG capsule Take 20 mg by mouth daily.     rosuvastatin (CRESTOR) 10 MG tablet Take 10 mg by mouth daily.     triamcinolone (NASACORT) 55 MCG/ACT AERO nasal inhaler Place 2 sprays into the nose daily.      vitamin B-12 (CYANOCOBALAMIN) 1000 MCG tablet Take 1,000 mcg by mouth daily.     vitamin E 400 UNIT capsule Take 400 Units by mouth daily.     No current facility-administered medications for this visit.   Facility-Administered Medications Ordered in Other Visits  Medication Dose Route Frequency Provider Last Rate Last Admin   Leuprolide Acetate (6 Month) (LUPRON) injection 45 mg  45 mg Intramuscular Once Lloyd Huger, MD        OBJECTIVE: Vitals:    12/29/21 1020  BP: 110/76  Pulse: 60  Temp: 98.6 F (37 C)     Body mass index is 31.48 kg/m.    ECOG FS:0 - Asymptomatic  General: Well-developed, well-nourished, no acute distress. Eyes: Pink conjunctiva, anicteric sclera. HEENT: Normocephalic, moist mucous membranes. Lungs: No audible wheezing or coughing. Heart: Regular rate and rhythm. Abdomen: Soft, nontender, no obvious distention. Musculoskeletal: No edema, cyanosis, or clubbing. Neuro: Alert, answering all questions appropriately. Cranial nerves grossly intact. Skin: No rashes or petechiae noted. Psych: Normal affect.  LAB RESULTS:  Lab Results  Component Value Date   NA 137 12/28/2021   K 4.0 12/28/2021   CL  101 12/28/2021   CO2 27 12/28/2021   GLUCOSE 106 (H) 12/28/2021   BUN 19 12/28/2021   CREATININE 1.01 12/28/2021   CALCIUM 9.4 12/28/2021   PROT 6.7 01/15/2020   ALBUMIN 4.1 01/15/2020   AST 23 01/15/2020   ALT 32 01/15/2020   ALKPHOS 71 01/15/2020   BILITOT 1.3 (H) 01/15/2020   GFRNONAA >60 12/28/2021   GFRAA >60 01/15/2020    Lab Results  Component Value Date   WBC 6.1 12/28/2021   NEUTROABS 2.8 12/28/2021   HGB 13.5 12/28/2021   HCT 40.1 12/28/2021   MCV 95.5 12/28/2021   PLT 183 12/28/2021     STUDIES: No results found.   ONCOLOGY TREATMENT HISTORY: Patient underwent radical prostatectomy on July 06, 2013.  Final pathology reported Gleason 7 (4+3), extracapsular extension, but margins were clear.  Seminal vesicles were not involved and 0 of 8 lymph nodes were negative for disease.  Patient noted to have a rising PSA and underwent salvage XRT in Spring of 2017.  Nuclear med bone scan on May 30, 2017 revealed improvement of uptake in the right posterior inferior pubic ramus and distal left femoral metadiaphysis.  PET scan on October 31, 2018 revealed no evidence of disease.  Patient reinitiated Lupron/Eligard on November 02, 2018.  He subsequently initiated Xtandi in June  2022.   ASSESSMENT: Stage IIIb prostate cancer  PLAN:    1.  Stage IIIb prostate cancer: Previously, patient's PSA has recently tripled over a 34-month timeframe from from 21.43-65.34.  Despite this, F18-PYLARIFY PET scan on Mar 11, 2021 revealed no evidence of disease.  He was subsequently initiated Xtandi in June 2022.  Continue Eligard every 6 months with his last treatment on September 29, 2021.  His PSA continues to trend down and is now 0.12.  Continue Xtandi indefinitely.  Return to clinic in 3 months with repeat laboratory work, further evaluation, and continuation of treatment including [place].  Appreciate clinical pharmacy input.   2.  Renal insufficiency: Resolved.  I spent a total of 30 minutes reviewing chart data, face-to-face evaluation with the patient, counseling and coordination of care as detailed above.   Patient expressed understanding and was in agreement with this plan. He also understands that He can call clinic at any time with any questions, concerns, or complaints.    Cancer Staging  Prostate cancer [facility]) Staging form: Prostate, AJCC 8th Edition - Clinical stage from 07/08/2018: Stage IIIB (cT3a, cN0, cM0, Grade Group: 3) - Signed by Lloyd Huger, MD on 07/08/2018 Gleason score: 7 Histologic grading system: 5 grade system   Lloyd Huger, MD   12/29/2021 10:44 AM

## 2021-12-28 ENCOUNTER — Inpatient Hospital Stay: Payer: Medicare Other | Attending: Oncology

## 2021-12-28 ENCOUNTER — Other Ambulatory Visit: Payer: Self-pay

## 2021-12-28 DIAGNOSIS — R531 Weakness: Secondary | ICD-10-CM | POA: Insufficient documentation

## 2021-12-28 DIAGNOSIS — Z8049 Family history of malignant neoplasm of other genital organs: Secondary | ICD-10-CM | POA: Diagnosis not present

## 2021-12-28 DIAGNOSIS — R5383 Other fatigue: Secondary | ICD-10-CM | POA: Insufficient documentation

## 2021-12-28 DIAGNOSIS — Z9079 Acquired absence of other genital organ(s): Secondary | ICD-10-CM | POA: Insufficient documentation

## 2021-12-28 DIAGNOSIS — Z87891 Personal history of nicotine dependence: Secondary | ICD-10-CM | POA: Diagnosis not present

## 2021-12-28 DIAGNOSIS — C61 Malignant neoplasm of prostate: Secondary | ICD-10-CM | POA: Diagnosis present

## 2021-12-28 DIAGNOSIS — Z808 Family history of malignant neoplasm of other organs or systems: Secondary | ICD-10-CM | POA: Diagnosis not present

## 2021-12-28 LAB — CBC WITH DIFFERENTIAL/PLATELET
Abs Immature Granulocytes: 0.03 10*3/uL (ref 0.00–0.07)
Basophils Absolute: 0 10*3/uL (ref 0.0–0.1)
Basophils Relative: 1 %
Eosinophils Absolute: 0.4 10*3/uL (ref 0.0–0.5)
Eosinophils Relative: 6 %
HCT: 40.1 % (ref 39.0–52.0)
Hemoglobin: 13.5 g/dL (ref 13.0–17.0)
Immature Granulocytes: 1 %
Lymphocytes Relative: 37 %
Lymphs Abs: 2.2 10*3/uL (ref 0.7–4.0)
MCH: 32.1 pg (ref 26.0–34.0)
MCHC: 33.7 g/dL (ref 30.0–36.0)
MCV: 95.5 fL (ref 80.0–100.0)
Monocytes Absolute: 0.6 10*3/uL (ref 0.1–1.0)
Monocytes Relative: 10 %
Neutro Abs: 2.8 10*3/uL (ref 1.7–7.7)
Neutrophils Relative %: 45 %
Platelets: 183 10*3/uL (ref 150–400)
RBC: 4.2 MIL/uL — ABNORMAL LOW (ref 4.22–5.81)
RDW: 13.2 % (ref 11.5–15.5)
WBC: 6.1 10*3/uL (ref 4.0–10.5)
nRBC: 0 % (ref 0.0–0.2)

## 2021-12-28 LAB — BASIC METABOLIC PANEL
Anion gap: 9 (ref 5–15)
BUN: 19 mg/dL (ref 8–23)
CO2: 27 mmol/L (ref 22–32)
Calcium: 9.4 mg/dL (ref 8.9–10.3)
Chloride: 101 mmol/L (ref 98–111)
Creatinine, Ser: 1.01 mg/dL (ref 0.61–1.24)
GFR, Estimated: >60 mL/min (ref >60)
Glucose, Bld: 106 mg/dL — ABNORMAL HIGH (ref 70–99)
Potassium: 4 mmol/L (ref 3.5–5.1)
Sodium: 137 mmol/L (ref 135–145)

## 2021-12-28 LAB — PSA: Prostatic Specific Antigen: 0.12 ng/mL (ref 0.00–4.00)

## 2021-12-29 ENCOUNTER — Encounter: Payer: Self-pay | Admitting: Oncology

## 2021-12-29 ENCOUNTER — Other Ambulatory Visit: Payer: Medicare Other

## 2021-12-29 ENCOUNTER — Inpatient Hospital Stay: Payer: Medicare Other | Admitting: Pharmacist

## 2021-12-29 ENCOUNTER — Inpatient Hospital Stay (HOSPITAL_BASED_OUTPATIENT_CLINIC_OR_DEPARTMENT_OTHER): Payer: Medicare Other | Admitting: Oncology

## 2021-12-29 VITALS — BP 110/76 | HR 60 | Temp 98.6°F | Wt 213.2 lb

## 2021-12-29 DIAGNOSIS — C61 Malignant neoplasm of prostate: Secondary | ICD-10-CM

## 2021-12-29 LAB — TESTOSTERONE: Testosterone: 4 ng/dL — ABNORMAL LOW (ref 264–916)

## 2021-12-29 MED ORDER — ENZALUTAMIDE 80 MG PO TABS: 160.0000 mg | ORAL_TABLET | Freq: Every day | ORAL | 2 refills | Status: DC

## 2021-12-29 MED ORDER — ENZALUTAMIDE 80 MG PO TABS: 160.0000 mg | ORAL_TABLET | Freq: Every day | ORAL | 0 refills | Status: DC

## 2021-12-29 NOTE — Progress Notes (Signed)
Pt said that he is experiencing fatigue and sometimes its worse than others.

## 2021-12-29 NOTE — Progress Notes (Signed)
[place]  Telephone:([phone] Fax:([phone]  Patient Care Team: [name], MD as PCP - General (Internal Medicine)   Name of the patient: Cody Ellison  [id number]  1951/01/12   Date of visit: 12/29/21  HPI: Patient is a 71 y.o. male with non-metastatic castration resistant prostate cancer. Patient started his Gillermina Phy (enzalutamide) on 04/07/21.  Reason for Consult: Oral chemotherapy follow-up for Xtandi (enzalutamide) therapy.   PAST MEDICAL HISTORY: Past Medical History:  Diagnosis Date   Arthritis    lower back    Cancer [facility])    prostate cancer    Coronary artery disease    stent RCA- 2000 and 12/2009    GERD (gastroesophageal reflux disease)    Hyperlipidemia    Hypertension    Skin cancer     HEMATOLOGY/ONCOLOGY HISTORY:  Oncology History  Prostate cancer ([place])  07/03/2018 Initial Diagnosis   Prostate cancer ([place])   07/08/2018 Cancer Staging   Staging form: Prostate, AJCC 8th Edition - Clinical stage from 07/08/2018: Stage IIIB (cT3a, cN0, cM0, Grade Group: 3) - Signed by Lloyd Huger, MD on 07/08/2018      ALLERGIES:  is allergic to ampicillin, atorvastatin, carvedilol, contrast media [iodinated contrast media], diltiazem hcl, fluvastatin, lovastatin, sulfa antibiotics, betadine [povidone iodine], and celecoxib.  MEDICATIONS:  Current Outpatient Medications  Medication Sig Dispense Refill   acetaminophen (TYLENOL) 500 MG tablet Take 1,000 mg by mouth 2 (two) times daily as needed.      atenolol (TENORMIN) 100 MG tablet Take 100 mg by mouth every morning.     b complex vitamins capsule Take 1 capsule by mouth daily.     Calcium Carbonate-Vit D-Min (CALTRATE 600+D PLUS PO) Take 1 tablet by mouth daily.      Coenzyme Q10 (COQ10) 100 MG CAPS Take 100 mg by mouth at bedtime.      enzalutamide (XTANDI) 40 MG tablet Take 4 tablets (160 mg total) by mouth daily. 120 tablet 2   felodipine (PLENDIL) 10 MG 24 hr  tablet Take 10 mg by mouth at bedtime.      fluticasone (FLONASE) 50 MCG/ACT nasal spray SPRAY 2 SPRAYS INTO EACH NOSTRIL EVERY DAY     folic acid (FOLVITE) 300 MCG tablet Take 800 mcg by mouth at bedtime.      Inositol Niacinate (NIACIN FLUSH FREE) 500 MG CAPS Take 1,000 mg by mouth in the morning and at bedtime.     Leuprolide Acetate, 6 Month, (LUPRON DEPOT, 69-MONTH,) 45 MG injection Inject 45 mg into the muscle every 6 (six) months.     lisinopril-hydrochlorothiazide (PRINZIDE,ZESTORETIC) 20-12.5 MG per tablet Take 1 tablet by mouth every morning.     omeprazole (PRILOSEC) 20 MG capsule Take 20 mg by mouth daily.     rosuvastatin (CRESTOR) 10 MG tablet Take 10 mg by mouth daily.     triamcinolone (NASACORT) 55 MCG/ACT AERO nasal inhaler Place 2 sprays into the nose daily.      vitamin B-12 (CYANOCOBALAMIN) 1000 MCG tablet Take 1,000 mcg by mouth daily.     vitamin E 400 UNIT capsule Take 400 Units by mouth daily.     No current facility-administered medications for this visit.   Facility-Administered Medications Ordered in Other Visits  Medication Dose Route Frequency Provider Last Rate Last Admin   Leuprolide Acetate (6 Month) (LUPRON) injection 45 mg  45 mg Intramuscular Once Lloyd Huger, MD        VITAL SIGNS: There were no  vitals taken for this visit. There were no vitals filed for this visit.  Estimated body mass index is 31.48 kg/m as calculated from the following:   Height as of 09/05/20: 5\' 9"  (1.753 m).   Weight as of an earlier encounter on 12/29/21: 96.7 kg (213 lb 3.2 oz).  LABS: CBC:    Component Value Date/Time   WBC 6.1 12/28/2021 1126   HGB 13.5 12/28/2021 1126   HCT 40.1 12/28/2021 1126   PLT 183 12/28/2021 1126   MCV 95.5 12/28/2021 1126   NEUTROABS 2.8 12/28/2021 1126   LYMPHSABS 2.2 12/28/2021 1126   MONOABS 0.6 12/28/2021 1126   EOSABS 0.4 12/28/2021 1126   BASOSABS 0.0 12/28/2021 1126   Comprehensive Metabolic Panel:    Component Value  Date/Time   NA 137 12/28/2021 1126   K 4.0 12/28/2021 1126   CL 101 12/28/2021 1126   CO2 27 12/28/2021 1126   BUN 19 12/28/2021 1126   CREATININE 1.01 12/28/2021 1126   GLUCOSE 106 (H) 12/28/2021 1126   CALCIUM 9.4 12/28/2021 1126   AST 23 01/15/2020 1317   ALT 32 01/15/2020 1317   ALKPHOS 71 01/15/2020 1317   BILITOT 1.3 (H) 01/15/2020 1317   PROT 6.7 01/15/2020 1317   ALBUMIN 4.1 01/15/2020 1317     Present during today's visit: Patient only  Assessment and Plan: Continue enzalutamide 160mg  daily   Oral Chemotherapy Side Effect/Intolerance:  Fatigue: Patient reports fatigue. Patient and his wife enjoy visiting thrift and antique stores for walking. Encouraged patient to remain active to help increase energy levels. Constipation: He reports using mineral oil every few weeks for hard stools. Encouraged patient to try docusate in place of mineral oil to reduce unwanted effects of mineral oil, such as diarrhea or urgency.  No reported edema or home HTN.   Oral Chemotherapy Adherence: no missed doses reported No patient barriers to medication adherence identified.   New medications: none reported  Medication Access Issues: Patient fills using manufacturer assistance, refill prescription sent to [place]. Sent 80 mg tablets for refill instead of 40 mg tablets to lower tablet burden. Patient has enough medication to get through Friday.   Patient expressed understanding and was in agreement with this plan. He also understands that He can call clinic at any time with any questions, concerns, or complaints.   Follow-up plan: RTC in 3 months  Thank you for allowing me to participate in the care of this very pleasant patient.   Time Total: 15 minutes  Visit consisted of counseling and education on dealing with issues of symptom management in the setting of serious and potentially life-threatening illness.Greater than 50%  of this time was spent counseling and coordinating  care related to the above assessment and plan.  Visit completed by: Deatra Robinson, PharmD Oncology PGY2  Signed by: Darl Pikes, PharmD, BCPS, Salley Slaughter, CPP Hematology/Oncology Clinical Pharmacist Practitioner [place]/DB/AP Oral [facility] [phone]  12/29/2021 11:09 AM

## 2022-01-14 ENCOUNTER — Ambulatory Visit: Payer: Medicare Other | Admitting: Radiation Oncology

## 2022-01-21 ENCOUNTER — Ambulatory Visit
Admission: RE | Admit: 2022-01-21 | Discharge: 2022-01-21 | Disposition: A | Discharge status: Home / Self Care | Payer: Medicare Other | Source: Ambulatory Visit | Attending: Radiation Oncology | Admitting: Radiation Oncology

## 2022-01-21 ENCOUNTER — Encounter: Payer: Self-pay | Admitting: Radiation Oncology

## 2022-01-21 ENCOUNTER — Other Ambulatory Visit: Payer: Self-pay

## 2022-01-21 DIAGNOSIS — C61 Malignant neoplasm of prostate: Secondary | ICD-10-CM | POA: Diagnosis present

## 2022-01-21 DIAGNOSIS — Z923 Personal history of irradiation: Secondary | ICD-10-CM | POA: Diagnosis not present

## 2022-01-21 NOTE — Progress Notes (Signed)
Radiation Oncology ?Follow up Note ? ?Name: Cody KREUSER Sr.   ?Date:   01/21/2022 ?MRN:  [id number] ?DOB: 1951/10/02  ? ? ?This 71 y.o. male presents to the clinic today for 6-1/2-year follow-up status post salvage radiation therapy to his prostate status post prostatectomy for Gleason 7 adenocarcinoma now on ADT and Xtandi. ? ?REFERRING PROVIDER: [name], MD ? ?HPI: Patient is a 71 year old male now out over 6 years having completed salvage radiation therapy for a Gleason 7 adenocarcinoma the prostate.  He is currently on [place].  His most recent PSA in February was 0.12 he specifically denies any increased lower urinary tract symptoms diarrhea or bone pain.[name]  He had a PSMA PET back in May which I have reviewed showing no evidence of local recurrence in the prostate bed and no evidence of metastatic adenopathy in the pelvis or periaortic retroperitoneum or skeletal metastasis.  He is currently under treatment as described above with medical oncology. ? ?COMPLICATIONS OF TREATMENT: none ? ?FOLLOW UP COMPLIANCE: keeps appointments  ? ?PHYSICAL EXAM:  ?BP (P) 97/64 (BP Location: Left Arm, Patient Position: Sitting)   Pulse (P) 64   Resp (P) 16   Ht (P) 5' 9.5" (1.765 m)   Wt (P) 211 lb (95.7 kg)   BMI (P) 30.71 kg/m?  ?Well-developed well-nourished patient in NAD. HEENT reveals PERLA, EOMI, discs not visualized.  Oral cavity is clear. No oral mucosal lesions are identified. Neck is clear without evidence of cervical or supraclavicular adenopathy. Lungs are clear to A&P. Cardiac examination is essentially unremarkable with regular rate and rhythm without murmur rub or thrill. Abdomen is benign with no organomegaly or masses noted. Motor sensory and DTR levels are equal and symmetric in the upper and lower extremities. Cranial nerves II through XII are grossly intact. Proprioception is intact. No peripheral adenopathy or edema is identified. No motor or sensory levels are noted. Crude visual  fields are within normal range. ? ?RADIOLOGY RESULTS: PET scan reviewed compatible with above-stated findings ? ?PLAN: At the present time patient is doing well he continues on a Jertson deprivation therapy as well as Xtandi under medical oncology's direction.  His PSA is low and stable.  I have asked to see him back in 1 year.  Be happy to reevaluate the patient anytime should further treatment be indicated. ? ?I would like to take this opportunity to thank you for allowing me to participate in the care of your patient.. ?  ? Noreene Filbert, MD ? ?

## 2022-01-22 ENCOUNTER — Other Ambulatory Visit: Payer: Self-pay | Admitting: Pharmacist

## 2022-01-22 DIAGNOSIS — C61 Malignant neoplasm of prostate: Secondary | ICD-10-CM

## 2022-02-25 ENCOUNTER — Other Ambulatory Visit: Payer: Self-pay | Admitting: Oncology

## 2022-02-25 DIAGNOSIS — C61 Malignant neoplasm of prostate: Secondary | ICD-10-CM

## 2022-03-10 ENCOUNTER — Encounter: Payer: Self-pay | Admitting: Oncology

## 2022-03-26 ENCOUNTER — Other Ambulatory Visit: Payer: Medicare Other

## 2022-03-30 ENCOUNTER — Ambulatory Visit: Payer: Medicare Other | Admitting: Oncology

## 2022-03-30 ENCOUNTER — Ambulatory Visit: Payer: Medicare Other

## 2022-03-30 ENCOUNTER — Other Ambulatory Visit: Payer: Medicare Other

## 2022-03-30 ENCOUNTER — Ambulatory Visit: Payer: Medicare Other | Admitting: Pharmacist

## 2022-04-02 ENCOUNTER — Other Ambulatory Visit: Payer: Self-pay

## 2022-04-02 DIAGNOSIS — C61 Malignant neoplasm of prostate: Secondary | ICD-10-CM

## 2022-04-05 ENCOUNTER — Inpatient Hospital Stay: Payer: Medicare Other | Attending: Oncology

## 2022-04-05 DIAGNOSIS — Z79899 Other long term (current) drug therapy: Secondary | ICD-10-CM | POA: Insufficient documentation

## 2022-04-05 DIAGNOSIS — Z5111 Encounter for antineoplastic chemotherapy: Secondary | ICD-10-CM | POA: Insufficient documentation

## 2022-04-05 DIAGNOSIS — C61 Malignant neoplasm of prostate: Secondary | ICD-10-CM | POA: Insufficient documentation

## 2022-04-05 LAB — BASIC METABOLIC PANEL
Anion gap: 7 (ref 5–15)
BUN: 24 mg/dL — ABNORMAL HIGH (ref 8–23)
CO2: 25 mmol/L (ref 22–32)
Calcium: 9.3 mg/dL (ref 8.9–10.3)
Chloride: 104 mmol/L (ref 98–111)
Creatinine, Ser: 1.15 mg/dL (ref 0.61–1.24)
GFR, Estimated: >60 mL/min (ref >60)
Glucose, Bld: 124 mg/dL — ABNORMAL HIGH (ref 70–99)
Potassium: 3.9 mmol/L (ref 3.5–5.1)
Sodium: 136 mmol/L (ref 135–145)

## 2022-04-05 LAB — CBC WITH DIFFERENTIAL/PLATELET
Abs Immature Granulocytes: 0.02 10*3/uL (ref 0.00–0.07)
Basophils Absolute: 0 10*3/uL (ref 0.0–0.1)
Basophils Relative: 1 %
Eosinophils Absolute: 0.4 10*3/uL (ref 0.0–0.5)
Eosinophils Relative: 7 %
HCT: 38.1 % — ABNORMAL LOW (ref 39.0–52.0)
Hemoglobin: 13.1 g/dL (ref 13.0–17.0)
Immature Granulocytes: 0 %
Lymphocytes Relative: 37 %
Lymphs Abs: 2.1 10*3/uL (ref 0.7–4.0)
MCH: 32.3 pg (ref 26.0–34.0)
MCHC: 34.4 g/dL (ref 30.0–36.0)
MCV: 94.1 fL (ref 80.0–100.0)
Monocytes Absolute: 0.6 10*3/uL (ref 0.1–1.0)
Monocytes Relative: 10 %
Neutro Abs: 2.6 10*3/uL (ref 1.7–7.7)
Neutrophils Relative %: 45 %
Platelets: 171 10*3/uL (ref 150–400)
RBC: 4.05 MIL/uL — ABNORMAL LOW (ref 4.22–5.81)
RDW: 13.3 % (ref 11.5–15.5)
WBC: 5.8 10*3/uL (ref 4.0–10.5)
nRBC: 0 % (ref 0.0–0.2)

## 2022-04-05 LAB — PSA: Prostatic Specific Antigen: 0.09 ng/mL (ref 0.00–4.00)

## 2022-04-05 NOTE — Progress Notes (Unsigned)
[place]  Telephone:([phone] Fax:(336) [phone]  ID: Cody Ellison Sr. OB: 1951/01/15  MR#: [id number]  FUX#:[id number]  Patient Care Team: [name], MD as PCP - General (Internal Medicine) Lloyd Huger, MD as Consulting Physician (Oncology)  CHIEF COMPLAINT: Stage IIIb prostate cancer  INTERVAL HISTORY: Patient returns to clinic today for repeat laboratory work, further evaluation, and continuation of Xtandi.  He has chronic weakness and fatigue from his treatment, but otherwise feels well.  He denies any pain. He has no neurologic complaints.  He denies any recent fevers or illnesses.  He has a good appetite and denies weight loss.  He denies any chest pain, shortness of breath, cough, or hemoptysis.  He denies any nausea, vomiting, constipation, or diarrhea.  He has no urinary complaints.  Patient offers no further specific complaints today.  REVIEW OF SYSTEMS:   Review of Systems  Constitutional:  Positive for malaise/fatigue. Negative for fever and weight loss.  Respiratory: Negative.  Negative for cough, hemoptysis and shortness of breath.   Cardiovascular: Negative.  Negative for chest pain and leg swelling.  Gastrointestinal: Negative.  Negative for abdominal pain.  Genitourinary: Negative.  Negative for dysuria and hematuria.  Musculoskeletal: Negative.  Negative for back pain.  Skin: Negative.  Negative for rash.  Neurological:  Positive for weakness. Negative for dizziness, sensory change, focal weakness and headaches.  Psychiatric/Behavioral: Negative.  The patient is not nervous/anxious.    As per HPI. Otherwise, a complete review of systems is negative.  PAST MEDICAL HISTORY: Past Medical History:  Diagnosis Date   Arthritis    lower back    Cancer [facility])    prostate cancer    Coronary artery disease    stent RCA- 2000 and 12/2009    GERD (gastroesophageal reflux disease)    Hyperlipidemia    Hypertension    Skin cancer      PAST SURGICAL HISTORY: Past Surgical History:  Procedure Laterality Date   APPENDECTOMY     COLONOSCOPY WITH PROPOFOL N/A 07/18/2017   Procedure: COLONOSCOPY WITH PROPOFOL;  Surgeon: [name], MD;  Location: [facility] ENDOSCOPY;  Service: Endoscopy;  Laterality: N/A;   CORONARY ANGIOPLASTY     coronary stents      x 2   KNEE ARTHROPLASTY Left 07/20/2017   Procedure: COMPUTER ASSISTED TOTAL KNEE ARTHROPLASTY;  Surgeon: [name], MD;  Location: [facility] ORS;  Service: Orthopedics;  Laterality: Left;   KNEE ARTHROPLASTY Right 01/23/2020   Procedure: COMPUTER ASSISTED TOTAL KNEE ARTHROPLASTY;  Surgeon: [name], MD;  Location: [facility] ORS;  Service: Orthopedics;  Laterality: Right;   ROBOT ASSISTED LAPAROSCOPIC RADICAL PROSTATECTOMY N/A 07/16/2013   Procedure: ROBOTIC ASSISTED LAPAROSCOPIC RADICAL PROSTATECTOMY LEVEL 2, bilateral pelvic lymphadenectomy;  Surgeon: [name], MD;  Location: [facility] ORS;  Service: Urology;  Laterality: N/A;   TONSILLECTOMY     and adenoidectomy     FAMILY HISTORY: Family History  Problem Relation Age of Onset   Cancer Mother 32       cervical cancer   Heart attack Father    Thyroid cancer Daughter 52       doing well    ADVANCED DIRECTIVES (Y/N):  N  HEALTH MAINTENANCE: Social History   Tobacco Use   Smoking status: Former    Packs/day: 1.00    Years: 30.00    Pack years: 30.00    Types: Cigarettes    Quit date: 11/01/1992    Years since quitting: 29.4  Smokeless tobacco: Never  Vaping Use   Vaping Use: Never used  Substance Use Topics   Alcohol use: No   Drug use: No     Colonoscopy:  PAP:  Bone density:  Lipid panel:  Allergies  Allergen Reactions   Ampicillin Other (See Comments)    Raw mouth and throat    Atorvastatin     Other reaction(s): Unknown   Carvedilol     Other reaction(s): Dizziness   Contrast Media [Iodinated Contrast Media] Hives   Diltiazem Hcl Other (See Comments)   Fluvastatin Other (See Comments)    Lovastatin Other (See Comments)   Sulfa Antibiotics Hives   Betadine [Povidone Iodine] Rash   Celecoxib Palpitations    Current Outpatient Medications  Medication Sig Dispense Refill   XTANDI 80 MG tablet Take 2 tablets (160 mg total) by mouth daily. 60 tablet 0   acetaminophen (TYLENOL) 500 MG tablet Take 1,000 mg by mouth 2 (two) times daily as needed.      atenolol (TENORMIN) 100 MG tablet Take 100 mg by mouth every morning.     b complex vitamins capsule Take 1 capsule by mouth daily.     Calcium Carbonate-Vit D-Min (CALTRATE 600+D PLUS PO) Take 1 tablet by mouth daily.      Coenzyme Q10 (COQ10) 100 MG CAPS Take 100 mg by mouth at bedtime.      felodipine (PLENDIL) 10 MG 24 hr tablet Take 10 mg by mouth at bedtime.      fluticasone (FLONASE) 50 MCG/ACT nasal spray SPRAY 2 SPRAYS INTO EACH NOSTRIL EVERY DAY     folic acid (FOLVITE) 443 MCG tablet Take 800 mcg by mouth at bedtime.      Inositol Niacinate (NIACIN FLUSH FREE) 500 MG CAPS Take 1,000 mg by mouth in the morning and at bedtime.     Leuprolide Acetate, 6 Month, (LUPRON DEPOT, 22-MONTH,) 45 MG injection Inject 45 mg into the muscle every 6 (six) months.     lisinopril-hydrochlorothiazide (PRINZIDE,ZESTORETIC) 20-12.5 MG per tablet Take 1 tablet by mouth every morning.     omeprazole (PRILOSEC) 20 MG capsule Take 20 mg by mouth daily.     rosuvastatin (CRESTOR) 10 MG tablet Take 10 mg by mouth daily.     triamcinolone (NASACORT) 55 MCG/ACT AERO nasal inhaler Place 2 sprays into the nose daily.      vitamin B-12 (CYANOCOBALAMIN) 1000 MCG tablet Take 1,000 mcg by mouth daily.     vitamin E 400 UNIT capsule Take 400 Units by mouth daily.     No current facility-administered medications for this visit.   Facility-Administered Medications Ordered in Other Visits  Medication Dose Route Frequency Provider Last Rate Last Admin   Leuprolide Acetate (6 Month) (LUPRON) injection 45 mg  45 mg Intramuscular Once Lloyd Huger, MD         OBJECTIVE: There were no vitals filed for this visit.    There is no height or weight on file to calculate BMI.    ECOG FS:0 - Asymptomatic  General: Well-developed, well-nourished, no acute distress. Eyes: Pink conjunctiva, anicteric sclera. HEENT: Normocephalic, moist mucous membranes. Lungs: No audible wheezing or coughing. Heart: Regular rate and rhythm. Abdomen: Soft, nontender, no obvious distention. Musculoskeletal: No edema, cyanosis, or clubbing. Neuro: Alert, answering all questions appropriately. Cranial nerves grossly intact. Skin: No rashes or petechiae noted. Psych: Normal affect.  LAB RESULTS:  Lab Results  Component Value Date   NA 137 12/28/2021   K 4.0 12/28/2021  CL 101 12/28/2021   CO2 27 12/28/2021   GLUCOSE 106 (H) 12/28/2021   BUN 19 12/28/2021   CREATININE 1.01 12/28/2021   CALCIUM 9.4 12/28/2021   PROT 6.7 01/15/2020   ALBUMIN 4.1 01/15/2020   AST 23 01/15/2020   ALT 32 01/15/2020   ALKPHOS 71 01/15/2020   BILITOT 1.3 (H) 01/15/2020   GFRNONAA >60 12/28/2021   GFRAA >60 01/15/2020    Lab Results  Component Value Date   WBC 6.1 12/28/2021   NEUTROABS 2.8 12/28/2021   HGB 13.5 12/28/2021   HCT 40.1 12/28/2021   MCV 95.5 12/28/2021   PLT 183 12/28/2021     STUDIES: No results found.   ONCOLOGY TREATMENT HISTORY: Patient underwent radical prostatectomy on July 06, 2013.  Final pathology reported Gleason 7 (4+3), extracapsular extension, but margins were clear.  Seminal vesicles were not involved and 0 of 8 lymph nodes were negative for disease.  Patient noted to have a rising PSA and underwent salvage XRT in Spring of 2017.  Nuclear med bone scan on May 30, 2017 revealed improvement of uptake in the right posterior inferior pubic ramus and distal left femoral metadiaphysis.  PET scan on October 31, 2018 revealed no evidence of disease.  Patient reinitiated Lupron/Eligard on November 02, 2018.  He subsequently initiated Xtandi in  June 2022.   ASSESSMENT: Stage IIIb prostate cancer  PLAN:    1.  Stage IIIb prostate cancer: Previously, patient's PSA has recently tripled over a 70-monthtimeframe from from 21.43-65.34.  Despite this, F18-PYLARIFY PET scan on Mar 11, 2021 revealed no evidence of disease.  He was subsequently initiated Xtandi in June 2022.  Continue Eligard every 6 months with his last treatment on September 29, 2021.  His PSA continues to trend down and is now 0.12.  Continue Xtandi indefinitely.  Return to clinic in 3 months with repeat laboratory work, further evaluation, and continuation of treatment including [place]  Appreciate clinical pharmacy input.   2.  Renal insufficiency: Resolved.  I spent a total of 30 minutes reviewing chart data, face-to-face evaluation with the patient, counseling and coordination of care as detailed above.   Patient expressed understanding and was in agreement with this plan. He also understands that He can call clinic at any time with any questions, concerns, or complaints.    Cancer Staging  Prostate cancer ([facility] Staging form: Prostate, AJCC 8th Edition - Clinical stage from 07/08/2018: Stage IIIB (cT3a, cN0, cM0, Grade Group: 3) - Signed by FLloyd Huger MD on 07/08/2018 Gleason score: 7 Histologic grading system: 5 grade system   TLloyd Huger MD   04/05/2022 10:00 AM

## 2022-04-06 ENCOUNTER — Inpatient Hospital Stay: Payer: Medicare Other

## 2022-04-06 ENCOUNTER — Inpatient Hospital Stay: Payer: Medicare Other | Admitting: Pharmacist

## 2022-04-06 ENCOUNTER — Encounter: Payer: Self-pay | Admitting: Oncology

## 2022-04-06 ENCOUNTER — Other Ambulatory Visit: Payer: Self-pay

## 2022-04-06 ENCOUNTER — Inpatient Hospital Stay (HOSPITAL_BASED_OUTPATIENT_CLINIC_OR_DEPARTMENT_OTHER): Payer: Medicare Other | Admitting: Oncology

## 2022-04-06 VITALS — BP 138/84 | HR 57 | Temp 96.9°F | Resp 18 | Wt 209.8 lb

## 2022-04-06 DIAGNOSIS — C61 Malignant neoplasm of prostate: Secondary | ICD-10-CM

## 2022-04-06 DIAGNOSIS — Z5111 Encounter for antineoplastic chemotherapy: Secondary | ICD-10-CM | POA: Diagnosis not present

## 2022-04-06 LAB — TESTOSTERONE: Testosterone: 6 ng/dL — ABNORMAL LOW (ref 264–916)

## 2022-04-06 MED ORDER — ENZALUTAMIDE 80 MG PO TABS: 160.0000 mg | ORAL_TABLET | Freq: Every day | ORAL | 3 refills | Status: DC

## 2022-04-06 MED ORDER — LEUPROLIDE ACETATE (6 MONTH) 45 MG ~~LOC~~ KIT
45.0000 mg | PACK | Freq: Once | SUBCUTANEOUS | Status: AC
  Administered 2022-04-06: 45 mg via SUBCUTANEOUS
  Filled 2022-04-06: qty 45

## 2022-04-06 NOTE — Progress Notes (Signed)
[place]  [phone] Fax:([phone]  Patient Care Team: [name], MD as PCP - General (Internal Medicine) Lloyd Huger, MD as Consulting Physician (Oncology)   Name of the patient: Cody Ellison  [id number]  Mar 06, 1951   Date of visit: 04/06/22  HPI: Patient is a 71 y.o. male with non-metastatic castration resistant prostate cancer. Patient started his Gillermina Phy (enzalutamide) on 04/07/21.  Reason for Consult: Oral chemotherapy follow-up for Xtandi (enzalutamide) therapy.   PAST MEDICAL HISTORY: Past Medical History:  Diagnosis Date   Arthritis    lower back    Cancer [facility])    prostate cancer    Coronary artery disease    stent RCA- 2000 and 12/2009    GERD (gastroesophageal reflux disease)    Hyperlipidemia    Hypertension    Skin cancer     HEMATOLOGY/ONCOLOGY HISTORY:  Oncology History  Prostate cancer ([place])  07/03/2018 Initial Diagnosis   Prostate cancer ([place])    07/08/2018 Cancer Staging   Staging form: Prostate, AJCC 8th Edition - Clinical stage from 07/08/2018: Stage IIIB (cT3a, cN0, cM0, Grade Group: 3) - Signed by Lloyd Huger, MD on 07/08/2018      ALLERGIES:  is allergic to ampicillin, atorvastatin, carvedilol, contrast media [iodinated contrast media], diltiazem hcl, fluvastatin, lovastatin, sulfa antibiotics, betadine [povidone iodine], and celecoxib.  MEDICATIONS:  Current Outpatient Medications  Medication Sig Dispense Refill   XTANDI 80 MG tablet Take 2 tablets (160 mg total) by mouth daily. 60 tablet 0   acetaminophen (TYLENOL) 500 MG tablet Take 1,000 mg by mouth 2 (two) times daily as needed.      atenolol (TENORMIN) 100 MG tablet Take 100 mg by mouth every morning.     b complex vitamins capsule Take 1 capsule by mouth daily.     Calcium Carbonate-Vit D-Min (CALTRATE 600+D PLUS PO) Take 1 tablet by mouth daily.      Coenzyme Q10 (COQ10) 100 MG CAPS Take 100 mg by mouth at  bedtime.      felodipine (PLENDIL) 10 MG 24 hr tablet Take 10 mg by mouth at bedtime.      fluticasone (FLONASE) 50 MCG/ACT nasal spray SPRAY 2 SPRAYS INTO EACH NOSTRIL EVERY DAY     folic acid (FOLVITE) 163 MCG tablet Take 800 mcg by mouth at bedtime.      Inositol Niacinate (NIACIN FLUSH FREE) 500 MG CAPS Take 1,000 mg by mouth in the morning and at bedtime.     Leuprolide Acetate, 6 Month, (LUPRON DEPOT, 73-MONTH,) 45 MG injection Inject 45 mg into the muscle every 6 (six) months.     lisinopril-hydrochlorothiazide (PRINZIDE,ZESTORETIC) 20-12.5 MG per tablet Take 1 tablet by mouth every morning.     omeprazole (PRILOSEC) 20 MG capsule Take 20 mg by mouth daily.     rosuvastatin (CRESTOR) 10 MG tablet Take 10 mg by mouth daily.     triamcinolone (NASACORT) 55 MCG/ACT AERO nasal inhaler Place 2 sprays into the nose daily.      vitamin B-12 (CYANOCOBALAMIN) 1000 MCG tablet Take 1,000 mcg by mouth daily.     vitamin E 400 UNIT capsule Take 400 Units by mouth daily.     No current facility-administered medications for this visit.   Facility-Administered Medications Ordered in Other Visits  Medication Dose Route Frequency Provider Last Rate Last Admin   Leuprolide Acetate (6 Month) (LUPRON) injection 45 mg  45 mg Intramuscular Once Lloyd Huger, MD  VITAL SIGNS: There were no vitals taken for this visit. There were no vitals filed for this visit.  Estimated body mass index is 30.71 kg/m (pended) as calculated from the following:   Height as of 01/21/22: (P) 5' 9.5" (1.765 m).   Weight as of 01/21/22: (P) 95.7 kg (211 lb).  LABS: CBC:    Component Value Date/Time   WBC 5.8 04/05/2022 1013   HGB 13.1 04/05/2022 1013   HCT 38.1 (L) 04/05/2022 1013   PLT 171 04/05/2022 1013   MCV 94.1 04/05/2022 1013   NEUTROABS 2.6 04/05/2022 1013   LYMPHSABS 2.1 04/05/2022 1013   MONOABS 0.6 04/05/2022 1013   EOSABS 0.4 04/05/2022 1013   BASOSABS 0.0 04/05/2022 1013   Comprehensive  Metabolic Panel:    Component Value Date/Time   NA 136 04/05/2022 1013   K 3.9 04/05/2022 1013   CL 104 04/05/2022 1013   CO2 25 04/05/2022 1013   BUN 24 (H) 04/05/2022 1013   CREATININE 1.15 04/05/2022 1013   GLUCOSE 124 (H) 04/05/2022 1013   CALCIUM 9.3 04/05/2022 1013   AST 23 01/15/2020 1317   ALT 32 01/15/2020 1317   ALKPHOS 71 01/15/2020 1317   BILITOT 1.3 (H) 01/15/2020 1317   PROT 6.7 01/15/2020 1317   ALBUMIN 4.1 01/15/2020 1317     Present during today's visit: Patient only  Assessment and Plan: Reviewed CBC/CMP, PSA, and testosterone, continue enzalutamide '160mg'$  daily   Oral Chemotherapy Side Effect/Intolerance:  No reported fatigue, constipation, edema or home HTN.  Patient manages constipation with mineral oil He reports having less "get up and go" but once he decides to do an activity he has enough energy to complete it    Oral Chemotherapy Adherence: no missed doses reported No patient barriers to medication adherence identified.   New medications: none reported  Medication Access Issues: Patient fills using manufacturer assistance, refill prescription sent to [place]  Patient expressed understanding and was in agreement with this plan. He also understands that He can call clinic at any time with any questions, concerns, or complaints.   Follow-up plan: RTC in 3 months  Thank you for allowing me to participate in the care of this very pleasant patient.   Time Total: 15 minutes  Visit consisted of counseling and education on dealing with issues of symptom management in the setting of serious and potentially life-threatening illness.Greater than 50%  of this time was spent counseling and coordinating care related to the above assessment and plan.   Darl Pikes, PharmD, BCPS, BCOP, CPP Hematology/Oncology Clinical Pharmacist Practitioner [place]/DB/AP Oral [facility] [phone]  04/06/2022 12:55 PM

## 2022-04-06 NOTE — Progress Notes (Signed)
Pt states that ever since starting XTANDI he ha noticed a difference in his vision: "foggy feeling in his eyes". Also; states omeprazole does not help as much anymore; switched from taking it at nighttime to taking it in the daytime with his XTANDI; dealing with indigestion. Not sure if it has something to do with what he eats. Pt will like to discuss GLUCOSE numbers and why are they keep increasing.

## 2022-07-01 ENCOUNTER — Other Ambulatory Visit (HOSPITAL_COMMUNITY): Payer: Self-pay

## 2022-07-07 ENCOUNTER — Other Ambulatory Visit: Payer: Medicare Other

## 2022-07-08 ENCOUNTER — Ambulatory Visit: Payer: Medicare Other | Admitting: Pharmacist

## 2022-07-14 ENCOUNTER — Inpatient Hospital Stay: Payer: Medicare Other | Attending: Oncology

## 2022-07-14 DIAGNOSIS — C61 Malignant neoplasm of prostate: Secondary | ICD-10-CM | POA: Insufficient documentation

## 2022-07-14 DIAGNOSIS — Z79899 Other long term (current) drug therapy: Secondary | ICD-10-CM | POA: Insufficient documentation

## 2022-07-14 LAB — CBC WITH DIFFERENTIAL/PLATELET
Abs Immature Granulocytes: 0.04 10*3/uL (ref 0.00–0.07)
Basophils Absolute: 0 10*3/uL (ref 0.0–0.1)
Basophils Relative: 1 %
Eosinophils Absolute: 0.7 10*3/uL — ABNORMAL HIGH (ref 0.0–0.5)
Eosinophils Relative: 10 %
HCT: 37.4 % — ABNORMAL LOW (ref 39.0–52.0)
Hemoglobin: 13 g/dL (ref 13.0–17.0)
Immature Granulocytes: 1 %
Lymphocytes Relative: 37 %
Lymphs Abs: 2.4 10*3/uL (ref 0.7–4.0)
MCH: 32.5 pg (ref 26.0–34.0)
MCHC: 34.8 g/dL (ref 30.0–36.0)
MCV: 93.5 fL (ref 80.0–100.0)
Monocytes Absolute: 0.6 10*3/uL (ref 0.1–1.0)
Monocytes Relative: 9 %
Neutro Abs: 2.7 10*3/uL (ref 1.7–7.7)
Neutrophils Relative %: 42 %
Platelets: 172 10*3/uL (ref 150–400)
RBC: 4 MIL/uL — ABNORMAL LOW (ref 4.22–5.81)
RDW: 13.2 % (ref 11.5–15.5)
WBC: 6.5 10*3/uL (ref 4.0–10.5)
nRBC: 0 % (ref 0.0–0.2)

## 2022-07-14 LAB — BASIC METABOLIC PANEL
Anion gap: 5 (ref 5–15)
BUN: 16 mg/dL (ref 8–23)
CO2: 26 mmol/L (ref 22–32)
Calcium: 9.4 mg/dL (ref 8.9–10.3)
Chloride: 107 mmol/L (ref 98–111)
Creatinine, Ser: 0.99 mg/dL (ref 0.61–1.24)
GFR, Estimated: >60 mL/min (ref >60)
Glucose, Bld: 106 mg/dL — ABNORMAL HIGH (ref 70–99)
Potassium: 3.7 mmol/L (ref 3.5–5.1)
Sodium: 138 mmol/L (ref 135–145)

## 2022-07-14 LAB — PSA: Prostatic Specific Antigen: 0.07 ng/mL (ref 0.00–4.00)

## 2022-07-15 ENCOUNTER — Inpatient Hospital Stay: Payer: Medicare Other | Admitting: Pharmacist

## 2022-07-15 ENCOUNTER — Encounter: Payer: Self-pay | Admitting: Pharmacist

## 2022-07-15 DIAGNOSIS — C61 Malignant neoplasm of prostate: Secondary | ICD-10-CM

## 2022-07-15 LAB — TESTOSTERONE: Testosterone: 3 ng/dL — ABNORMAL LOW (ref 264–916)

## 2022-07-15 MED ORDER — ENZALUTAMIDE 80 MG PO TABS: 160.0000 mg | ORAL_TABLET | Freq: Every day | ORAL | 4 refills | Status: DC

## 2022-07-15 NOTE — Progress Notes (Signed)
[place]  Telephone:([phone] Fax:([phone]  Patient Care Team: [name], MD as PCP - General (Internal Medicine) Lloyd Huger, MD as Consulting Physician (Oncology)   Name of the patient: Cody Ellison  [id number]  08-18-1951   Date of visit: 07/15/22  HPI: Patient is a 71 y.o. male with non-metastatic castration resistant prostate cancer. Patient started his Gillermina Phy (enzalutamide) on 04/07/21.  Reason for Consult: Oral chemotherapy follow-up for Xtandi (enzalutamide) therapy.   PAST MEDICAL HISTORY: Past Medical History:  Diagnosis Date   Arthritis    lower back    Cancer [facility])    prostate cancer    Coronary artery disease    stent RCA- 2000 and 12/2009    GERD (gastroesophageal reflux disease)    Hyperlipidemia    Hypertension    Skin cancer     HEMATOLOGY/ONCOLOGY HISTORY:  Oncology History  Prostate cancer ([place])  07/03/2018 Initial Diagnosis   Prostate cancer ([place])   07/08/2018 Cancer Staging   Staging form: Prostate, AJCC 8th Edition - Clinical stage from 07/08/2018: Stage IIIB (cT3a, cN0, cM0, Grade Group: 3) - Signed by Lloyd Huger, MD on 07/08/2018     ALLERGIES:  is allergic to ampicillin, atorvastatin, carvedilol, contrast media [iodinated contrast media], diltiazem hcl, fluvastatin, lovastatin, sulfa antibiotics, betadine [povidone iodine], and celecoxib.  MEDICATIONS:  Current Outpatient Medications  Medication Sig Dispense Refill   acetaminophen (TYLENOL) 500 MG tablet Take 1,000 mg by mouth 2 (two) times daily as needed.      atenolol (TENORMIN) 100 MG tablet Take 100 mg by mouth every morning.     b complex vitamins capsule Take 1 capsule by mouth daily.     Calcium Carbonate-Vit D-Min (CALTRATE 600+D PLUS PO) Take 1 tablet by mouth daily.      Coenzyme Q10 (COQ10) 100 MG CAPS Take 100 mg by mouth at bedtime.      enzalutamide (XTANDI) 80 MG tablet Take 2 tablets (160 mg total) by  mouth daily. 60 tablet 3   felodipine (PLENDIL) 10 MG 24 hr tablet Take 10 mg by mouth at bedtime.      fluticasone (FLONASE) 50 MCG/ACT nasal spray SPRAY 2 SPRAYS INTO EACH NOSTRIL EVERY DAY     folic acid (FOLVITE) 509 MCG tablet Take 800 mcg by mouth at bedtime.      Inositol Niacinate (NIACIN FLUSH FREE) 500 MG CAPS Take 1,000 mg by mouth in the morning and at bedtime.     lisinopril-hydrochlorothiazide (PRINZIDE,ZESTORETIC) 20-12.5 MG per tablet Take 1 tablet by mouth every morning.     NON FORMULARY MINERAL OIL--USE FOR CONSTIPATION.     omeprazole (PRILOSEC) 20 MG capsule Take 20 mg by mouth daily.     rosuvastatin (CRESTOR) 10 MG tablet Take 10 mg by mouth daily.     triamcinolone (NASACORT) 55 MCG/ACT AERO nasal inhaler Place 2 sprays into the nose daily.      vitamin B-12 (CYANOCOBALAMIN) 1000 MCG tablet Take 1,000 mcg by mouth daily.     vitamin E 400 UNIT capsule Take 400 Units by mouth daily.     Leuprolide Acetate, 6 Month, (LUPRON DEPOT, 85-MONTH,) 45 MG injection Inject 45 mg into the muscle every 6 (six) months. (Patient not taking: Reported on 04/06/2022)     No current facility-administered medications for this visit.   Facility-Administered Medications Ordered in Other Visits  Medication Dose Route Frequency Provider Last Rate Last Admin   Leuprolide Acetate (6 Month) (LUPRON) injection  45 mg  45 mg Intramuscular Once Lloyd Huger, MD        VITAL SIGNS: BP 122/81 (BP Location: Left Arm, Patient Position: Sitting, Cuff Size: Normal)   Pulse 61   Temp (!) 96.8 F (36 C) (Tympanic)   Resp 16   Ht '5\' 9"'$  (1.753 m)   Wt 95.3 kg (210 lb)   SpO2 100%   BMI 31.01 kg/m  Filed Weights   07/15/22 1356  Weight: 95.3 kg (210 lb)    Estimated body mass index is 31.01 kg/m as calculated from the following:   Height as of this encounter: '5\' 9"'$  (1.753 m).   Weight as of this encounter: 95.3 kg (210 lb).  LABS: CBC:    Component Value Date/Time   WBC 6.5 07/14/2022  1301   HGB 13.0 07/14/2022 1301   HCT 37.4 (L) 07/14/2022 1301   PLT 172 07/14/2022 1301   MCV 93.5 07/14/2022 1301   NEUTROABS 2.7 07/14/2022 1301   LYMPHSABS 2.4 07/14/2022 1301   MONOABS 0.6 07/14/2022 1301   EOSABS 0.7 (H) 07/14/2022 1301   BASOSABS 0.0 07/14/2022 1301   Comprehensive Metabolic Panel:    Component Value Date/Time   NA 138 07/14/2022 1301   K 3.7 07/14/2022 1301   CL 107 07/14/2022 1301   CO2 26 07/14/2022 1301   BUN 16 07/14/2022 1301   CREATININE 0.99 07/14/2022 1301   GLUCOSE 106 (H) 07/14/2022 1301   CALCIUM 9.4 07/14/2022 1301   AST 23 01/15/2020 1317   ALT 32 01/15/2020 1317   ALKPHOS 71 01/15/2020 1317   BILITOT 1.3 (H) 01/15/2020 1317   PROT 6.7 01/15/2020 1317   ALBUMIN 4.1 01/15/2020 1317     Present during today's visit: Patient only  Assessment and Plan: Reviewed CBC/CMP, PSA, and testosterone, continue enzalutamide '160mg'$  daily   Oral Chemotherapy Side Effect/Intolerance:  Constipation: Patient effectively manages constipation with mineral oil and milk of magnesia.  Fatigue: Patient reports mild/moderate fatigue. Patient reports being instructed by Dr. Sabra Heck, PCP to take half of atenolol 100 mg to help manage fatigue/bradycardia.  Hot flashes: Patient continues to report hot flashes.  No reported edema    Oral Chemotherapy Adherence: no missed doses reported No patient barriers to medication adherence identified.   New medications: none reported  Medication Access Issues: Patient fills using manufacturer assistance, refill prescription sent to [place] [facility]  Patient expressed understanding and was in agreement with this plan. He also understands that He can call clinic at any time with any questions, concerns, or complaints.   Follow-up plan: RTC in 3 months  Thank you for allowing me to participate in the care of this very pleasant patient.   Time Total: 15 minutes  Visit consisted of counseling and education on dealing  with issues of symptom management in the setting of serious and potentially life-threatening illness.Greater than 50%  of this time was spent counseling and coordinating care related to the above assessment and plan.   Darl Pikes, PharmD, BCPS, BCOP, CPP Hematology/Oncology Clinical Pharmacist Practitioner [place]/DB/AP [facility] [phone]  07/15/2022 2:34 PM

## 2022-08-16 ENCOUNTER — Other Ambulatory Visit: Payer: Self-pay | Admitting: Pharmacist

## 2022-08-16 ENCOUNTER — Telehealth: Payer: Self-pay

## 2022-08-16 DIAGNOSIS — C61 Malignant neoplasm of prostate: Secondary | ICD-10-CM

## 2022-08-16 MED ORDER — ENZALUTAMIDE 80 MG PO TABS: 160.0000 mg | ORAL_TABLET | Freq: Every day | ORAL | 4 refills | Status: DC

## 2022-08-16 NOTE — Telephone Encounter (Signed)
Oral Oncology Patient Advocate Encounter   Received notification that patient is due for re-enrollment for assistance for Xtandi through [facility].   Re-enrollment process has been initiated and has been submitted.  Re-enrollment key: [id number]  Xtandi's phone number [phone].   I will continue to follow until final determination.  Berdine Addison, [facility] Patient [place]  [phone] (phone) [phone] (fax) 08/16/2022 10:25 AM

## 2022-08-16 NOTE — Progress Notes (Unsigned)
Patient is being reevaluated for enrollment in 2024 [facility] assistance. For this re-enrollment the program request a refill Rx be sent to [facility]. Cody Ellison will follow up on full program approval.

## 2022-08-19 NOTE — Telephone Encounter (Signed)
Oral Oncology Patient Advocate Encounter   Received notification that the application for assistance for Xtandi through [employer] has been approved for 2024.   [employer]' phone number [phone].   Effective dates: 01.01.24 through 12.31.24   Berdine Addison, [place] Patient [place]  [phone] (phone) [phone] (fax) 08/19/2022 10:32 AM

## 2022-10-04 ENCOUNTER — Inpatient Hospital Stay: Payer: Medicare Other | Attending: Oncology

## 2022-10-04 DIAGNOSIS — Z5111 Encounter for antineoplastic chemotherapy: Secondary | ICD-10-CM | POA: Insufficient documentation

## 2022-10-04 DIAGNOSIS — Z79899 Other long term (current) drug therapy: Secondary | ICD-10-CM | POA: Insufficient documentation

## 2022-10-04 DIAGNOSIS — C61 Malignant neoplasm of prostate: Secondary | ICD-10-CM | POA: Insufficient documentation

## 2022-10-04 LAB — BASIC METABOLIC PANEL
Anion gap: 10 (ref 5–15)
BUN: 17 mg/dL (ref 8–23)
CO2: 26 mmol/L (ref 22–32)
Calcium: 9.5 mg/dL (ref 8.9–10.3)
Chloride: 103 mmol/L (ref 98–111)
Creatinine, Ser: 1.14 mg/dL (ref 0.61–1.24)
GFR, Estimated: >60 mL/min (ref >60)
Glucose, Bld: 118 mg/dL — ABNORMAL HIGH (ref 70–99)
Potassium: 3.9 mmol/L (ref 3.5–5.1)
Sodium: 139 mmol/L (ref 135–145)

## 2022-10-04 LAB — CBC WITH DIFFERENTIAL/PLATELET
Abs Immature Granulocytes: 0.02 10*3/uL (ref 0.00–0.07)
Basophils Absolute: 0 10*3/uL (ref 0.0–0.1)
Basophils Relative: 1 %
Eosinophils Absolute: 0.3 10*3/uL (ref 0.0–0.5)
Eosinophils Relative: 4 %
HCT: 41.1 % (ref 39.0–52.0)
Hemoglobin: 13.9 g/dL (ref 13.0–17.0)
Immature Granulocytes: 0 %
Lymphocytes Relative: 41 %
Lymphs Abs: 2.6 10*3/uL (ref 0.7–4.0)
MCH: 31.2 pg (ref 26.0–34.0)
MCHC: 33.8 g/dL (ref 30.0–36.0)
MCV: 92.4 fL (ref 80.0–100.0)
Monocytes Absolute: 0.6 10*3/uL (ref 0.1–1.0)
Monocytes Relative: 9 %
Neutro Abs: 2.8 10*3/uL (ref 1.7–7.7)
Neutrophils Relative %: 45 %
Platelets: 181 10*3/uL (ref 150–400)
RBC: 4.45 MIL/uL (ref 4.22–5.81)
RDW: 13.2 % (ref 11.5–15.5)
WBC: 6.3 10*3/uL (ref 4.0–10.5)
nRBC: 0 % (ref 0.0–0.2)

## 2022-10-04 LAB — PSA: Prostatic Specific Antigen: 0.06 ng/mL (ref 0.00–4.00)

## 2022-10-05 ENCOUNTER — Inpatient Hospital Stay: Payer: Medicare Other

## 2022-10-05 ENCOUNTER — Encounter: Payer: Self-pay | Admitting: Oncology

## 2022-10-05 ENCOUNTER — Inpatient Hospital Stay (HOSPITAL_BASED_OUTPATIENT_CLINIC_OR_DEPARTMENT_OTHER): Payer: Medicare Other | Admitting: Oncology

## 2022-10-05 VITALS — BP 135/91 | HR 61 | Temp 98.3°F | Resp 16 | Ht 69.0 in | Wt 209.0 lb

## 2022-10-05 DIAGNOSIS — C61 Malignant neoplasm of prostate: Secondary | ICD-10-CM | POA: Diagnosis not present

## 2022-10-05 DIAGNOSIS — Z5111 Encounter for antineoplastic chemotherapy: Secondary | ICD-10-CM | POA: Diagnosis not present

## 2022-10-05 LAB — TESTOSTERONE: Testosterone: 16 ng/dL — ABNORMAL LOW (ref 264–916)

## 2022-10-05 MED ORDER — LEUPROLIDE ACETATE (6 MONTH) 45 MG ~~LOC~~ KIT
45.0000 mg | PACK | Freq: Once | SUBCUTANEOUS | Status: AC
  Administered 2022-10-05: 45 mg via SUBCUTANEOUS
  Filled 2022-10-05: qty 45

## 2022-10-05 NOTE — Progress Notes (Signed)
[place]  [phone] Fax:(336) [phone]  ID: Cody Spry Sr. OB: [birth year]  MR#: [id number]  JEH#:[id number]  Patient Care Team: [name], MD as PCP - General (Internal Medicine) Lloyd Huger, MD as Consulting Physician (Oncology)  CHIEF COMPLAINT: Stage IIIb prostate cancer  INTERVAL HISTORY: Patient returns to clinic today for repeat laboratory work, further evaluation, and continuation of Xtandi and Haena.  He currently feels well and is asymptomatic.  He is tolerating his treatments well without significant side effects.  He does not complain of weakness or fatigue today.  He denies any pain. He has no neurologic complaints.  He denies any recent fevers or illnesses.  He has a good appetite and denies weight loss.  He denies any chest pain, shortness of breath, cough, or hemoptysis.  He denies any nausea, vomiting, constipation, or diarrhea.  He has no urinary complaints.  Patient offers no specific complaints today.  REVIEW OF SYSTEMS:   Review of Systems  Constitutional: Negative.  Negative for fever, malaise/fatigue and weight loss.  Respiratory: Negative.  Negative for cough, hemoptysis and shortness of breath.   Cardiovascular: Negative.  Negative for chest pain and leg swelling.  Gastrointestinal: Negative.  Negative for abdominal pain.  Genitourinary: Negative.  Negative for dysuria and hematuria.  Musculoskeletal: Negative.  Negative for back pain.  Skin: Negative.  Negative for rash.  Neurological: Negative.  Negative for dizziness, sensory change, focal weakness, weakness and headaches.  Psychiatric/Behavioral: Negative.  The patient is not nervous/anxious.     As per HPI. Otherwise, a complete review of systems is negative.  PAST MEDICAL HISTORY: Past Medical History:  Diagnosis Date  . Arthritis    lower back   . Cancer [facility])    prostate cancer   . Coronary artery disease    stent RCA- 2000 and 12/2009   .  GERD (gastroesophageal reflux disease)   . Hyperlipidemia   . Hypertension   . Skin cancer     PAST SURGICAL HISTORY: Past Surgical History:  Procedure Laterality Date  . APPENDECTOMY    . COLONOSCOPY WITH PROPOFOL N/A 07/18/2017   Procedure: COLONOSCOPY WITH PROPOFOL;  Surgeon: [name], MD;  Location: [facility] ENDOSCOPY;  Service: Endoscopy;  Laterality: N/A;  . CORONARY ANGIOPLASTY    . coronary stents      x 2  . KNEE ARTHROPLASTY Left 07/20/2017   Procedure: COMPUTER ASSISTED TOTAL KNEE ARTHROPLASTY;  Surgeon: [name], MD;  Location: [facility] ORS;  Service: Orthopedics;  Laterality: Left;  . KNEE ARTHROPLASTY Right 01/23/2020   Procedure: COMPUTER ASSISTED TOTAL KNEE ARTHROPLASTY;  Surgeon: [name], MD;  Location: [facility] ORS;  Service: Orthopedics;  Laterality: Right;  . ROBOT ASSISTED LAPAROSCOPIC RADICAL PROSTATECTOMY N/A 07/16/2013   Procedure: ROBOTIC ASSISTED LAPAROSCOPIC RADICAL PROSTATECTOMY LEVEL 2, bilateral pelvic lymphadenectomy;  Surgeon: [name], MD;  Location: [facility] ORS;  Service: Urology;  Laterality: N/A;  . TONSILLECTOMY     and adenoidectomy     FAMILY HISTORY: Family History  Problem Relation Age of Onset  . Cancer Mother 12       cervical cancer  . Heart attack Father   . Thyroid cancer Daughter 58       doing well    ADVANCED DIRECTIVES (Y/N):  N  HEALTH MAINTENANCE: Social History   Tobacco Use  . Smoking status: Former    Packs/day: 1.00    Years: 30.00    Total pack years: 30.00    Types:  Cigarettes    Quit date: 11/01/1992    Years since quitting: 29.9  . Smokeless tobacco: Never  Vaping Use  . Vaping Use: Never used  Substance Use Topics  . Alcohol use: No  . Drug use: No     Colonoscopy:  PAP:  Bone density:  Lipid panel:  Allergies  Allergen Reactions  . Ampicillin Other (See Comments)    Raw mouth and throat   . Atorvastatin     Other reaction(s): Unknown  . Carvedilol     Other reaction(s): Dizziness  .  Contrast Media [Iodinated Contrast Media] Hives  . Diltiazem Hcl Other (See Comments)  . Fluvastatin Other (See Comments)  . Lovastatin Other (See Comments)  . Sulfa Antibiotics Hives  . Betadine [Povidone Iodine] Rash  . Celecoxib Palpitations    Current Outpatient Medications  Medication Sig Dispense Refill  . acetaminophen (TYLENOL) 500 MG tablet Take 1,000 mg by mouth 2 (two) times daily as needed.     [name] atenolol (TENORMIN) 100 MG tablet Take 50 mg by mouth every morning.    [name] b complex vitamins capsule Take 1 capsule by mouth daily.    . Calcium Carbonate-Vit D-Min (CALTRATE 600+D PLUS PO) Take 1 tablet by mouth daily.     . Coenzyme Q10 (COQ10) 100 MG CAPS Take 100 mg by mouth at bedtime.     . enzalutamide (XTANDI) 80 MG tablet Take 2 tablets (160 mg total) by mouth daily. 60 tablet 4  . felodipine (PLENDIL) 10 MG 24 hr tablet Take 10 mg by mouth at bedtime.     . fluticasone (FLONASE) 50 MCG/ACT nasal spray SPRAY 2 SPRAYS INTO EACH NOSTRIL EVERY DAY    . folic acid (FOLVITE) 270 MCG tablet Take 800 mcg by mouth at bedtime.     . Inositol Niacinate (NIACIN FLUSH FREE) 500 MG CAPS Take 1,000 mg by mouth in the morning and at bedtime.    [name] lisinopril-hydrochlorothiazide (PRINZIDE,ZESTORETIC) 20-12.5 MG per tablet Take 1 tablet by mouth every morning.    . NON FORMULARY MINERAL OIL--USE FOR CONSTIPATION.    [name] omeprazole (PRILOSEC) 20 MG capsule Take 20 mg by mouth daily.    . rosuvastatin (CRESTOR) 10 MG tablet Take 10 mg by mouth daily.    [name] triamcinolone (NASACORT) 55 MCG/ACT AERO nasal inhaler Place 2 sprays into the nose daily.     . vitamin B-12 (CYANOCOBALAMIN) 1000 MCG tablet Take 1,000 mcg by mouth daily.    . vitamin E 400 UNIT capsule Take 400 Units by mouth daily.    [name] Leuprolide Acetate, 6 Month, (LUPRON DEPOT, 34-MONTH,) 45 MG injection Inject 45 mg into the muscle every 6 (six) months. (Patient not taking: Reported on 04/06/2022)     No current facility-administered  medications for this visit.   Facility-Administered Medications Ordered in Other Visits  Medication Dose Route Frequency Provider Last Rate Last Admin  . Leuprolide Acetate (6 Month) (LUPRON) injection 45 mg  45 mg Intramuscular Once Lloyd Huger, MD        OBJECTIVE: Vitals:   10/05/22 1304  BP: (!) 185/92  Pulse: 61  Resp: 16  Temp: 98.3 F (36.8 C)  SpO2: 97%     Body mass index is 30.86 kg/m.    ECOG FS:0 - Asymptomatic  General: Well-developed, well-nourished, no acute distress. Eyes: Pink conjunctiva, anicteric sclera. HEENT: Normocephalic, moist mucous membranes. Lungs: No audible wheezing or coughing. Heart: Regular rate and rhythm. Abdomen: Soft, nontender, no obvious distention. Musculoskeletal:  No edema, cyanosis, or clubbing. Neuro: Alert, answering all questions appropriately. Cranial nerves grossly intact. Skin: No rashes or petechiae noted. Psych: Normal affect.  LAB RESULTS:  Lab Results  Component Value Date   NA 139 10/04/2022   K 3.9 10/04/2022   CL 103 10/04/2022   CO2 26 10/04/2022   GLUCOSE 118 (H) 10/04/2022   BUN 17 10/04/2022   CREATININE 1.14 10/04/2022   CALCIUM 9.5 10/04/2022   PROT 6.7 01/15/2020   ALBUMIN 4.1 01/15/2020   AST 23 01/15/2020   ALT 32 01/15/2020   ALKPHOS 71 01/15/2020   BILITOT 1.3 (H) 01/15/2020   GFRNONAA >60 10/04/2022   GFRAA >60 01/15/2020    Lab Results  Component Value Date   WBC 6.3 10/04/2022   NEUTROABS 2.8 10/04/2022   HGB 13.9 10/04/2022   HCT 41.1 10/04/2022   MCV 92.4 10/04/2022   PLT 181 10/04/2022     STUDIES: No results found.   ONCOLOGY TREATMENT HISTORY: Patient underwent radical prostatectomy on July 06, 2013.  Final pathology reported Gleason 7 (4+3), extracapsular extension, but margins were clear.  Seminal vesicles were not involved and 0 of 8 lymph nodes were negative for disease.  Patient noted to have a rising PSA and underwent salvage XRT in Spring of 2017.  Nuclear  med bone scan on May 30, 2017 revealed improvement of uptake in the right posterior inferior pubic ramus and distal left femoral metadiaphysis.  PET scan on October 31, 2018 revealed no evidence of disease.  Patient reinitiated Lupron/Eligard on November 02, 2018.  He subsequently initiated Xtandi in June 2022.   ASSESSMENT: Stage IIIb prostate cancer  PLAN:    1.  Stage IIIb prostate cancer: Previously, patient's PSA has recently tripled over a 36-monthtimeframe from from 21.43-65.34.  Despite this, F18-PYLARIFY PET scan on Mar 11, 2021 revealed no evidence of disease.  He was subsequently initiated Xtandi in June 2022.  Patient's PSA continues to trend down and is at 0.09.  Continue Eligard every 6 months, proceed with treatment today.  Continue Xtandi indefinitely or until progression of disease.  Return to clinic in 3 months for laboratory and evaluation by clinical pharmacy.  Patient will then return to clinic in 6 months for laboratory work, evaluation by MD, and continuation of Eligard.   2.  Renal insufficiency: Resolved.  I spent a total of 30 minutes reviewing chart data, face-to-face evaluation with the patient, counseling and coordination of care as detailed above.    Patient expressed understanding and was in agreement with this plan. He also understands that He can call clinic at any time with any questions, concerns, or complaints.    Cancer Staging  Prostate cancer ([facility] Staging form: Prostate, AJCC 8th Edition - Clinical stage from 07/08/2018: Stage IIIB (cT3a, cN0, cM0, Grade Group: 3) - Signed by FLloyd Huger MD on 07/08/2018 Gleason score: 7 Histologic grading system: 5 grade system   TLloyd Huger MD   10/05/2022 1:14 PM

## 2022-10-06 ENCOUNTER — Encounter: Payer: Self-pay | Admitting: Oncology

## 2022-11-01 DIAGNOSIS — I219 Acute myocardial infarction, unspecified: Secondary | ICD-10-CM

## 2022-11-01 HISTORY — DX: Acute myocardial infarction, unspecified: I21.9

## 2023-01-04 ENCOUNTER — Inpatient Hospital Stay: Payer: Medicare Other | Admitting: Pharmacist

## 2023-01-04 ENCOUNTER — Inpatient Hospital Stay: Payer: Medicare Other | Attending: Oncology

## 2023-01-04 ENCOUNTER — Encounter: Payer: Self-pay | Admitting: Oncology

## 2023-01-04 DIAGNOSIS — C61 Malignant neoplasm of prostate: Secondary | ICD-10-CM | POA: Diagnosis present

## 2023-01-04 LAB — CBC WITH DIFFERENTIAL/PLATELET
Abs Immature Granulocytes: 0.03 10*3/uL (ref 0.00–0.07)
Basophils Absolute: 0 10*3/uL (ref 0.0–0.1)
Basophils Relative: 1 %
Eosinophils Absolute: 0.5 10*3/uL (ref 0.0–0.5)
Eosinophils Relative: 8 %
HCT: 40.4 % (ref 39.0–52.0)
Hemoglobin: 13.5 g/dL (ref 13.0–17.0)
Immature Granulocytes: 1 %
Lymphocytes Relative: 39 %
Lymphs Abs: 2.7 10*3/uL (ref 0.7–4.0)
MCH: 30.9 pg (ref 26.0–34.0)
MCHC: 33.4 g/dL (ref 30.0–36.0)
MCV: 92.4 fL (ref 80.0–100.0)
Monocytes Absolute: 0.7 10*3/uL (ref 0.1–1.0)
Monocytes Relative: 11 %
Neutro Abs: 2.6 10*3/uL (ref 1.7–7.7)
Neutrophils Relative %: 40 %
Platelets: 178 10*3/uL (ref 150–400)
RBC: 4.37 MIL/uL (ref 4.22–5.81)
RDW: 13.2 % (ref 11.5–15.5)
WBC: 6.6 10*3/uL (ref 4.0–10.5)
nRBC: 0 % (ref 0.0–0.2)

## 2023-01-04 LAB — BASIC METABOLIC PANEL
Anion gap: 8 (ref 5–15)
BUN: 17 mg/dL (ref 8–23)
CO2: 25 mmol/L (ref 22–32)
Calcium: 9.2 mg/dL (ref 8.9–10.3)
Chloride: 103 mmol/L (ref 98–111)
Creatinine, Ser: 1.28 mg/dL — ABNORMAL HIGH (ref 0.61–1.24)
GFR, Estimated: 60 mL/min — ABNORMAL LOW (ref >60)
Glucose, Bld: 109 mg/dL — ABNORMAL HIGH (ref 70–99)
Potassium: 3.9 mmol/L (ref 3.5–5.1)
Sodium: 136 mmol/L (ref 135–145)

## 2023-01-04 LAB — PSA: Prostatic Specific Antigen: 0.12 ng/mL (ref 0.00–4.00)

## 2023-01-04 MED ORDER — ENZALUTAMIDE 80 MG PO TABS: 160.0000 mg | ORAL_TABLET | Freq: Every day | ORAL | 5 refills | Status: DC

## 2023-01-04 NOTE — Progress Notes (Signed)
[place]  [phone] Fax:([phone]  Patient Care Team: [name], MD as PCP - General (Internal Medicine) Lloyd Huger, MD as Consulting Physician (Oncology)   Name of the patient: Cody Ellison  [id number]  December 29, 1950   Date of visit: 01/04/23  HPI: Patient is a 72 y.o. male with non-metastatic castration resistant prostate cancer. Patient started his Gillermina Phy (enzalutamide) on 04/07/21.  Reason for Consult: Oral chemotherapy follow-up for Xtandi (enzalutamide) therapy.   PAST MEDICAL HISTORY: Past Medical History:  Diagnosis Date   Arthritis    lower back    Cancer [facility])    prostate cancer    Coronary artery disease    stent RCA- 2000 and 12/2009    GERD (gastroesophageal reflux disease)    Hyperlipidemia    Hypertension    Skin cancer     HEMATOLOGY/ONCOLOGY HISTORY:  Oncology History  Prostate cancer ([place])  07/03/2018 Initial Diagnosis   Prostate cancer ([place])   07/08/2018 Cancer Staging   Staging form: Prostate, AJCC 8th Edition - Clinical stage from 07/08/2018: Stage IIIB (cT3a, cN0, cM0, Grade Group: 3) - Signed by Lloyd Huger, MD on 07/08/2018     ALLERGIES:  is allergic to ampicillin, atorvastatin, carvedilol, contrast media [iodinated contrast media], diltiazem hcl, fluvastatin, lovastatin, sulfa antibiotics, betadine [povidone iodine], and celecoxib.  MEDICATIONS:  Current Outpatient Medications  Medication Sig Dispense Refill   acetaminophen (TYLENOL) 500 MG tablet Take 1,000 mg by mouth 2 (two) times daily as needed.      atenolol (TENORMIN) 100 MG tablet Take 50 mg by mouth every morning.     b complex vitamins capsule Take 1 capsule by mouth daily.     Calcium Carbonate-Vit D-Min (CALTRATE 600+D PLUS PO) Take 1 tablet by mouth daily.      Coenzyme Q10 (COQ10) 100 MG CAPS Take 100 mg by mouth at bedtime.      enzalutamide (XTANDI) 80 MG tablet Take 2 tablets (160 mg total) by  mouth daily. 60 tablet 4   felodipine (PLENDIL) 10 MG 24 hr tablet Take 10 mg by mouth at bedtime.      fluticasone (FLONASE) 50 MCG/ACT nasal spray SPRAY 2 SPRAYS INTO EACH NOSTRIL EVERY DAY     folic acid (FOLVITE) [id number] MCG tablet Take 800 mcg by mouth at bedtime.      Inositol Niacinate (NIACIN FLUSH FREE) 500 MG CAPS Take 1,000 mg by mouth in the morning and at bedtime.     Leuprolide Acetate, 6 Month, (LUPRON DEPOT, 38-MONTH,) 45 MG injection Inject 45 mg into the muscle every 6 (six) months. (Patient not taking: Reported on 04/06/2022)     lisinopril-hydrochlorothiazide (PRINZIDE,ZESTORETIC) 20-12.5 MG per tablet Take 1 tablet by mouth every morning.     NON FORMULARY MINERAL OIL--USE FOR CONSTIPATION.     omeprazole (PRILOSEC) 20 MG capsule Take 20 mg by mouth daily.     rosuvastatin (CRESTOR) 10 MG tablet Take 10 mg by mouth daily.     triamcinolone (NASACORT) 55 MCG/ACT AERO nasal inhaler Place 2 sprays into the nose daily.      vitamin B-12 (CYANOCOBALAMIN) 1000 MCG tablet Take 1,000 mcg by mouth daily.     vitamin E 400 UNIT capsule Take 400 Units by mouth daily.     No current facility-administered medications for this visit.   Facility-Administered Medications Ordered in Other Visits  Medication Dose Route Frequency Provider Last Rate Last Admin   Leuprolide Acetate (6 Month) (LUPRON) injection  45 mg  45 mg Intramuscular Once Lloyd Huger, MD        VITAL SIGNS: There were no vitals taken for this visit. There were no vitals filed for this visit.   Estimated body mass index is 30.86 kg/m as calculated from the following:   Height as of 10/05/22: '5\' 9"'$  (1.753 m).   Weight as of 10/05/22: 94.8 kg (209 lb).  LABS: CBC:    Component Value Date/Time   WBC 6.6 01/04/2023 0937   HGB 13.5 01/04/2023 0937   HCT 40.4 01/04/2023 0937   PLT 178 01/04/2023 0937   MCV 92.4 01/04/2023 0937   NEUTROABS 2.6 01/04/2023 0937   LYMPHSABS 2.7 01/04/2023 0937   MONOABS 0.7 01/04/2023  0937   EOSABS 0.5 01/04/2023 0937   BASOSABS 0.0 01/04/2023 0937   Comprehensive Metabolic Panel:    Component Value Date/Time   NA 136 01/04/2023 0937   K 3.9 01/04/2023 0937   CL 103 01/04/2023 0937   CO2 25 01/04/2023 0937   BUN 17 01/04/2023 0937   CREATININE 1.28 (H) 01/04/2023 0937   GLUCOSE 109 (H) 01/04/2023 0937   CALCIUM 9.2 01/04/2023 0937   AST 23 01/15/2020 1317   ALT 32 01/15/2020 1317   ALKPHOS 71 01/15/2020 1317   BILITOT 1.3 (H) 01/15/2020 1317   PROT 6.7 01/15/2020 1317   ALBUMIN 4.1 01/15/2020 1317     Present during today's visit: Patient only  Assessment and Plan: Reviewed CBC/BMP (PSA/testosterone pending), continue enzalutamide '160mg'$  daily   Oral Chemotherapy Side Effect/Intolerance:  No reported fatigue, headache/dizziness, or edema   Oral Chemotherapy Adherence: no missed doses reported No patient barriers to medication adherence identified.   New medications: none reported  Medication Access Issues: Patient fills using manufacturer assistance, refill prescription sent to [place]  Patient expressed understanding and was in agreement with this plan. He also understands that He can call clinic at any time with any questions, concerns, or complaints.   Follow-up plan: RTC in 3 months  Thank you for allowing me to participate in the care of this very pleasant patient.   Time Total: 15 minutes  Visit consisted of counseling and education on dealing with issues of symptom management in the setting of serious and potentially life-threatening illness.Greater than 50%  of this time was spent counseling and coordinating care related to the above assessment and plan.   Darl Pikes, PharmD, BCPS, BCOP, CPP Hematology/Oncology Clinical Pharmacist Practitioner [place]/DB/AP [facility] [phone]  01/04/2023 11:27 AM

## 2023-01-06 LAB — TESTOSTERONE: Testosterone: 10 ng/dL — ABNORMAL LOW (ref 264–916)

## 2023-01-20 ENCOUNTER — Ambulatory Visit: Payer: Medicare Other | Admitting: Radiation Oncology

## 2023-01-31 ENCOUNTER — Encounter: Payer: Self-pay | Admitting: Radiation Oncology

## 2023-01-31 ENCOUNTER — Ambulatory Visit
Admission: RE | Admit: 2023-01-31 | Discharge: 2023-01-31 | Disposition: A | Discharge status: Home / Self Care | Payer: Medicare Other | Source: Ambulatory Visit | Attending: Radiation Oncology | Admitting: Radiation Oncology

## 2023-01-31 VITALS — BP 107/62 | HR 62 | Temp 95.9°F | Resp 18 | Ht 69.0 in

## 2023-01-31 DIAGNOSIS — C61 Malignant neoplasm of prostate: Secondary | ICD-10-CM | POA: Insufficient documentation

## 2023-01-31 DIAGNOSIS — Z923 Personal history of irradiation: Secondary | ICD-10-CM | POA: Insufficient documentation

## 2023-01-31 DIAGNOSIS — C7951 Secondary malignant neoplasm of bone: Secondary | ICD-10-CM | POA: Diagnosis not present

## 2023-01-31 NOTE — Progress Notes (Signed)
Radiation Oncology Follow up Note  Name: Cody CARRY Sr.   Date:   01/31/2023 MRN:  [id number] DOB: Jun 19, 1951    This 72 y.o. male presents to the clinic today for 7-year follow-up status post salvage radiation therapy to his prostate for Gleason 7 adenocarcinoma stage IIIb.  REFERRING PROVIDER: [name], MD  HPI: Patient is a 72 year old male now out over 7 years for salvage radiation therapy for stage IIIb adenocarcinoma of the prostate.  Seen today in routine follow-up he is doing well from a clinical standpoint specifically denies any increased lower urinary tract seen.  He is currently on Eligard as well as Xtandi.  His PSA has creeped up slowly most recently 3 weeks ago for 1 to 2.063 months ago.  He is having no bone pain at this time.  His last PSMA PET scan was back in May [id number]  COMPLICATIONS OF TREATMENT: none  FOLLOW UP COMPLIANCE: keeps appointments   PHYSICAL EXAM:  BP 107/62   Pulse 62   Temp (!) 95.9 F (35.5 C)   Resp 18   Ht 5\' 9"  (1.753 m)   BMI 30.86 kg/m  Well-developed well-nourished patient in NAD. HEENT reveals PERLA, EOMI, discs not visualized.  Oral cavity is clear. No oral mucosal lesions are identified. Neck is clear without evidence of cervical or supraclavicular adenopathy. Lungs are clear to A&P. Cardiac examination is essentially unremarkable with regular rate and rhythm without murmur rub or thrill. Abdomen is benign with no organomegaly or masses noted. Motor sensory and DTR levels are equal and symmetric in the upper and lower extremities. Cranial nerves II through XII are grossly intact. Proprioception is intact. No peripheral adenopathy or edema is identified. No motor or sensory levels are noted. Crude visual fields are within normal range.  RADIOLOGY RESULTS: No current films for review  PLAN: Present time his PSA is slowly going up castrate resistant phase of show another PSMA PET scan be performed showing any evidence of bone mets with  argue for treating them empirically.  Otherwise I am turning follow-up care over to medical oncology.  Be happy to reevaluate the patient anytime should that be indicated.  I would like to take this opportunity to thank you for allowing me to participate in the care of your patient.Noreene Filbert, MD

## 2023-02-18 ENCOUNTER — Inpatient Hospital Stay
Admission: EM | Admit: 2023-02-18 | Discharge: 2023-02-18 | DRG: 281 | Disposition: A | Discharge status: Other Acute Inpatient Hospital | Payer: Medicare Other | Attending: Student | Admitting: Student

## 2023-02-18 ENCOUNTER — Other Ambulatory Visit: Payer: Self-pay

## 2023-02-18 ENCOUNTER — Emergency Department: Payer: Medicare Other

## 2023-02-18 ENCOUNTER — Encounter: Payer: Self-pay | Admitting: Emergency Medicine

## 2023-02-18 DIAGNOSIS — I214 Non-ST elevation (NSTEMI) myocardial infarction: Principal | ICD-10-CM | POA: Diagnosis present

## 2023-02-18 DIAGNOSIS — Z881 Allergy status to other antibiotic agents status: Secondary | ICD-10-CM

## 2023-02-18 DIAGNOSIS — Z9079 Acquired absence of other genital organ(s): Secondary | ICD-10-CM | POA: Diagnosis not present

## 2023-02-18 DIAGNOSIS — C61 Malignant neoplasm of prostate: Secondary | ICD-10-CM | POA: Diagnosis present

## 2023-02-18 DIAGNOSIS — Z923 Personal history of irradiation: Secondary | ICD-10-CM

## 2023-02-18 DIAGNOSIS — E119 Type 2 diabetes mellitus without complications: Secondary | ICD-10-CM | POA: Diagnosis present

## 2023-02-18 DIAGNOSIS — Z8249 Family history of ischemic heart disease and other diseases of the circulatory system: Secondary | ICD-10-CM | POA: Diagnosis not present

## 2023-02-18 DIAGNOSIS — I10 Essential (primary) hypertension: Secondary | ICD-10-CM | POA: Diagnosis not present

## 2023-02-18 DIAGNOSIS — Z79899 Other long term (current) drug therapy: Secondary | ICD-10-CM

## 2023-02-18 DIAGNOSIS — Z955 Presence of coronary angioplasty implant and graft: Secondary | ICD-10-CM

## 2023-02-18 DIAGNOSIS — Q245 Malformation of coronary vessels: Secondary | ICD-10-CM

## 2023-02-18 DIAGNOSIS — E785 Hyperlipidemia, unspecified: Secondary | ICD-10-CM

## 2023-02-18 DIAGNOSIS — E781 Pure hyperglyceridemia: Secondary | ICD-10-CM | POA: Diagnosis present

## 2023-02-18 DIAGNOSIS — I119 Hypertensive heart disease without heart failure: Secondary | ICD-10-CM | POA: Diagnosis present

## 2023-02-18 DIAGNOSIS — Z85828 Personal history of other malignant neoplasm of skin: Secondary | ICD-10-CM

## 2023-02-18 DIAGNOSIS — I2510 Atherosclerotic heart disease of native coronary artery without angina pectoris: Secondary | ICD-10-CM | POA: Diagnosis present

## 2023-02-18 DIAGNOSIS — Z87891 Personal history of nicotine dependence: Secondary | ICD-10-CM

## 2023-02-18 DIAGNOSIS — Z880 Allergy status to penicillin: Secondary | ICD-10-CM | POA: Diagnosis not present

## 2023-02-18 DIAGNOSIS — Z7982 Long term (current) use of aspirin: Secondary | ICD-10-CM

## 2023-02-18 DIAGNOSIS — Z882 Allergy status to sulfonamides status: Secondary | ICD-10-CM | POA: Diagnosis not present

## 2023-02-18 DIAGNOSIS — R079 Chest pain, unspecified: Secondary | ICD-10-CM | POA: Diagnosis present

## 2023-02-18 DIAGNOSIS — K219 Gastro-esophageal reflux disease without esophagitis: Secondary | ICD-10-CM | POA: Diagnosis present

## 2023-02-18 DIAGNOSIS — Z91041 Radiographic dye allergy status: Secondary | ICD-10-CM | POA: Diagnosis not present

## 2023-02-18 DIAGNOSIS — Z96653 Presence of artificial knee joint, bilateral: Secondary | ICD-10-CM | POA: Diagnosis present

## 2023-02-18 DIAGNOSIS — Z888 Allergy status to other drugs, medicaments and biological substances status: Secondary | ICD-10-CM | POA: Diagnosis not present

## 2023-02-18 DIAGNOSIS — E876 Hypokalemia: Secondary | ICD-10-CM | POA: Diagnosis present

## 2023-02-18 DIAGNOSIS — Z808 Family history of malignant neoplasm of other organs or systems: Secondary | ICD-10-CM | POA: Diagnosis not present

## 2023-02-18 LAB — LIPID PANEL
Cholesterol: 187 mg/dL (ref 0–200)
HDL: 30 mg/dL — ABNORMAL LOW (ref >40)
LDL Cholesterol: UNDETERMINED mg/dL (ref 0–99)
Total CHOL/HDL Ratio: 6.2 RATIO
Triglycerides: 565 mg/dL — ABNORMAL HIGH (ref <150)
VLDL: UNDETERMINED mg/dL (ref 0–40)

## 2023-02-18 LAB — CBC
HCT: 43.3 % (ref 39.0–52.0)
HCT: 39.8 % (ref 39.0–52.0)
Hemoglobin: 14.4 g/dL (ref 13.0–17.0)
Hemoglobin: 13.6 g/dL (ref 13.0–17.0)
MCH: 31.5 pg (ref 26.0–34.0)
MCH: 31.7 pg (ref 26.0–34.0)
MCHC: 33.3 g/dL (ref 30.0–36.0)
MCHC: 34.2 g/dL (ref 30.0–36.0)
MCV: 94.7 fL (ref 80.0–100.0)
MCV: 92.8 fL (ref 80.0–100.0)
Platelets: 188 10*3/uL (ref 150–400)
Platelets: 176 10*3/uL (ref 150–400)
RBC: 4.57 MIL/uL (ref 4.22–5.81)
RBC: 4.29 MIL/uL (ref 4.22–5.81)
RDW: 13.5 % (ref 11.5–15.5)
RDW: 13.5 % (ref 11.5–15.5)
WBC: 7.2 10*3/uL (ref 4.0–10.5)
WBC: 6.7 10*3/uL (ref 4.0–10.5)
nRBC: 0 % (ref 0.0–0.2)
nRBC: 0 % (ref 0.0–0.2)

## 2023-02-18 LAB — BASIC METABOLIC PANEL
Anion gap: 12 (ref 5–15)
Anion gap: 10 (ref 5–15)
BUN: 18 mg/dL (ref 8–23)
BUN: 16 mg/dL (ref 8–23)
CO2: 21 mmol/L — ABNORMAL LOW (ref 22–32)
CO2: 25 mmol/L (ref 22–32)
Calcium: 9.7 mg/dL (ref 8.9–10.3)
Calcium: 9.8 mg/dL (ref 8.9–10.3)
Chloride: 105 mmol/L (ref 98–111)
Chloride: 105 mmol/L (ref 98–111)
Creatinine, Ser: 1.08 mg/dL (ref 0.61–1.24)
Creatinine, Ser: 0.99 mg/dL (ref 0.61–1.24)
GFR, Estimated: >60 mL/min (ref >60)
GFR, Estimated: >60 mL/min (ref >60)
Glucose, Bld: 163 mg/dL — ABNORMAL HIGH (ref 70–99)
Glucose, Bld: 128 mg/dL — ABNORMAL HIGH (ref 70–99)
Potassium: 3.4 mmol/L — ABNORMAL LOW (ref 3.5–5.1)
Potassium: 3.5 mmol/L (ref 3.5–5.1)
Sodium: 138 mmol/L (ref 135–145)
Sodium: 140 mmol/L (ref 135–145)

## 2023-02-18 LAB — TROPONIN I (HIGH SENSITIVITY)
Troponin I (High Sensitivity): 333 ng/L (ref <18)
Troponin I (High Sensitivity): 485 ng/L (ref <18)
Troponin I (High Sensitivity): 1037 ng/L (ref <18)

## 2023-02-18 LAB — PROTIME-INR
INR: 1 (ref 0.8–1.2)
INR: 1 (ref 0.8–1.2)
Prothrombin Time: 13.3 seconds (ref 11.4–15.2)
Prothrombin Time: 12.8 seconds (ref 11.4–15.2)

## 2023-02-18 LAB — MAGNESIUM: Magnesium: 2.2 mg/dL (ref 1.7–2.4)

## 2023-02-18 LAB — PHOSPHORUS: Phosphorus: 3.4 mg/dL (ref 2.5–4.6)

## 2023-02-18 LAB — LDL CHOLESTEROL, DIRECT: Direct LDL: 102 mg/dL — ABNORMAL HIGH (ref 0–99)

## 2023-02-18 LAB — APTT: aPTT: 25 seconds (ref 24–36)

## 2023-02-18 MED ORDER — ASPIRIN 81 MG PO TBEC
81.0000 mg | DELAYED_RELEASE_TABLET | Freq: Every day | ORAL | Status: DC
  Administered 2023-02-18: 81 mg via ORAL
  Filled 2023-02-18: qty 1

## 2023-02-18 MED ORDER — OYSTER SHELL CALCIUM/D3 500-5 MG-MCG PO TABS
1.0000 | ORAL_TABLET | Freq: Every day | ORAL | Status: DC
  Administered 2023-02-18: 1 via ORAL
  Filled 2023-02-18: qty 1

## 2023-02-18 MED ORDER — NITROGLYCERIN 0.4 MG SL SUBL: 0.4000 mg | SUBLINGUAL_TABLET | SUBLINGUAL | Status: DC | PRN

## 2023-02-18 MED ORDER — HEPARIN (PORCINE) 25000 UT/250ML-% IV SOLN
1200.0000 [IU]/h | INTRAVENOUS | Status: DC
  Administered 2023-02-18: 1200 [IU]/h via INTRAVENOUS
  Filled 2023-02-18: qty 250

## 2023-02-18 MED ORDER — HYDRALAZINE HCL 50 MG PO TABS: 50.0000 mg | ORAL_TABLET | Freq: Four times a day (QID) | ORAL | Status: DC | PRN

## 2023-02-18 MED ORDER — HEPARIN (PORCINE) 25000 UT/250ML-% IV SOLN: 1200.0000 [IU]/h | INTRAVENOUS | Status: AC

## 2023-02-18 MED ORDER — ATENOLOL 25 MG PO TABS
50.0000 mg | ORAL_TABLET | Freq: Every morning | ORAL | Status: DC
  Administered 2023-02-18: 50 mg via ORAL
  Filled 2023-02-18: qty 2

## 2023-02-18 MED ORDER — FELODIPINE ER 10 MG PO TB24
10.0000 mg | ORAL_TABLET | Freq: Every day | ORAL | Status: DC
  Filled 2023-02-18: qty 1

## 2023-02-18 MED ORDER — NIACIN ER (ANTIHYPERLIPIDEMIC) 500 MG PO TBCR
1000.0000 mg | EXTENDED_RELEASE_TABLET | Freq: Two times a day (BID) | ORAL | Status: DC
  Filled 2023-02-18: qty 2

## 2023-02-18 MED ORDER — ASPIRIN 81 MG PO CHEW: 324.0000 mg | CHEWABLE_TABLET | ORAL | Status: DC

## 2023-02-18 MED ORDER — B COMPLEX-C PO TABS
1.0000 | ORAL_TABLET | Freq: Every day | ORAL | Status: DC
  Administered 2023-02-18: 1 via ORAL
  Filled 2023-02-18: qty 1

## 2023-02-18 MED ORDER — POTASSIUM CHLORIDE IN NACL 20-0.9 MEQ/L-% IV SOLN
INTRAVENOUS | Status: DC
  Filled 2023-02-18: qty 1000

## 2023-02-18 MED ORDER — LISINOPRIL-HYDROCHLOROTHIAZIDE 20-12.5 MG PO TABS: 1.0000 | ORAL_TABLET | Freq: Every morning | ORAL | Status: DC

## 2023-02-18 MED ORDER — ROSUVASTATIN CALCIUM 20 MG PO TABS
20.0000 mg | ORAL_TABLET | Freq: Every day | ORAL | Status: DC
  Administered 2023-02-18: 20 mg via ORAL
  Filled 2023-02-18: qty 1

## 2023-02-18 MED ORDER — TRAZODONE HCL 50 MG PO TABS: 25.0000 mg | ORAL_TABLET | Freq: Every evening | ORAL | Status: DC | PRN

## 2023-02-18 MED ORDER — COQ10 100 MG PO CAPS: 100.0000 mg | ORAL_CAPSULE | Freq: Every day | ORAL | Status: DC

## 2023-02-18 MED ORDER — CYANOCOBALAMIN 500 MCG PO TABS
1000.0000 ug | ORAL_TABLET | Freq: Every day | ORAL | Status: DC
  Administered 2023-02-18: 1000 ug via ORAL
  Filled 2023-02-18: qty 2

## 2023-02-18 MED ORDER — SODIUM CHLORIDE 0.9 % IV SOLN: INTRAVENOUS | Status: DC

## 2023-02-18 MED ORDER — HYDRALAZINE HCL 20 MG/ML IJ SOLN: 10.0000 mg | Freq: Four times a day (QID) | INTRAMUSCULAR | Status: DC | PRN

## 2023-02-18 MED ORDER — ACETAMINOPHEN 325 MG PO TABS: 650.0000 mg | ORAL_TABLET | ORAL | Status: DC | PRN

## 2023-02-18 MED ORDER — FOLIC ACID 1 MG PO TABS: 1000.0000 ug | ORAL_TABLET | Freq: Every day | ORAL | Status: DC

## 2023-02-18 MED ORDER — NITROGLYCERIN 0.4 MG SL SUBL: 0.4000 mg | SUBLINGUAL_TABLET | SUBLINGUAL | 12 refills | Status: AC | PRN

## 2023-02-18 MED ORDER — MAGNESIUM HYDROXIDE 400 MG/5ML PO SUSP: 30.0000 mL | Freq: Every day | ORAL | Status: DC | PRN

## 2023-02-18 MED ORDER — HYDROCHLOROTHIAZIDE 12.5 MG PO TABS
12.5000 mg | ORAL_TABLET | Freq: Every day | ORAL | Status: DC
  Administered 2023-02-18: 12.5 mg via ORAL
  Filled 2023-02-18: qty 1

## 2023-02-18 MED ORDER — ALPRAZOLAM 0.5 MG PO TABS: 0.2500 mg | ORAL_TABLET | Freq: Two times a day (BID) | ORAL | Status: DC | PRN

## 2023-02-18 MED ORDER — ONDANSETRON HCL 4 MG/2ML IJ SOLN: 4.0000 mg | Freq: Four times a day (QID) | INTRAMUSCULAR | Status: DC | PRN

## 2023-02-18 MED ORDER — FENOFIBRATE 54 MG PO TABS
54.0000 mg | ORAL_TABLET | Freq: Every day | ORAL | Status: DC
  Administered 2023-02-18: 54 mg via ORAL
  Filled 2023-02-18: qty 1

## 2023-02-18 MED ORDER — VITAMIN E 45 MG (100 UNIT) PO CAPS
400.0000 [IU] | ORAL_CAPSULE | Freq: Every day | ORAL | Status: DC
  Administered 2023-02-18: 400 [IU] via ORAL
  Filled 2023-02-18: qty 4

## 2023-02-18 MED ORDER — HEPARIN BOLUS VIA INFUSION
4000.0000 [IU] | Freq: Once | INTRAVENOUS | Status: AC
  Administered 2023-02-18: 4000 [IU] via INTRAVENOUS
  Filled 2023-02-18: qty 4000

## 2023-02-18 MED ORDER — ASPIRIN 300 MG RE SUPP: 300.0000 mg | RECTAL | Status: DC

## 2023-02-18 MED ORDER — LISINOPRIL 10 MG PO TABS
20.0000 mg | ORAL_TABLET | Freq: Every day | ORAL | Status: DC
  Administered 2023-02-18: 20 mg via ORAL
  Filled 2023-02-18: qty 2

## 2023-02-18 MED ORDER — PANTOPRAZOLE SODIUM 40 MG PO TBEC
40.0000 mg | DELAYED_RELEASE_TABLET | Freq: Every day | ORAL | Status: DC
  Administered 2023-02-18: 40 mg via ORAL
  Filled 2023-02-18: qty 1

## 2023-02-18 MED ORDER — FELODIPINE ER 10 MG PO TB24: 10.0000 mg | ORAL_TABLET | Freq: Every day | ORAL | Status: DC

## 2023-02-18 NOTE — Progress Notes (Signed)
ANTICOAGULATION CONSULT NOTE  Pharmacy Consult for heparin infusion Indication: ACS/STEMI  Allergies  Allergen Reactions   Ampicillin Other (See Comments)    Raw mouth and throat    Atorvastatin     Other reaction(s): Unknown   Carvedilol     Other reaction(s): Dizziness   Contrast Media [Iodinated Contrast Media] Hives   Diltiazem Hcl Other (See Comments)   Fluvastatin Other (See Comments)   Lovastatin Other (See Comments)   Sulfa Antibiotics Hives   Betadine [Povidone Iodine] Rash   Celecoxib Palpitations    Patient Measurements: Height:  (175.3 cm) Weight: 95.3 kg (210 lb) IBW/kg (Calculated) : 70.7 Heparin Dosing Weight: 90.4 kg  Vital Signs: Temp: 98.4 F (36.9 C) (04/19 0047) Temp Source: Oral (04/19 0047) BP: 166/82 (04/19 0047) Pulse Rate: 70 (04/19 0047)  Labs: Recent Labs    02/18/23 0048  HGB 14.4  HCT 43.3  PLT 188  CREATININE 1.08  TROPONINIHS 333*    Estimated Creatinine Clearance: 71.4 mL/min (by C-G formula based on SCr of 1.08 mg/dL).   Medical History: Past Medical History:  Diagnosis Date   Arthritis    lower back    Cancer    prostate cancer    Coronary artery disease    stent RCA- 2000 and 12/2009    GERD (gastroesophageal reflux disease)    Hyperlipidemia    Hypertension    Skin cancer     Assessment: Pt is a 72 yo male presenting to ED c/o left-sided chest pain found with elevated Troponin I level.  Goal of Therapy:  Heparin level 0.3-0.7 units/ml Monitor platelets by anticoagulation protocol: Yes   Plan:  Bolus 4000 units x 1 Start heparin infusion at 1200 units/hr Will check HL in 8 hr after start of infusion CBC daily while on heparin  Otelia Sergeant, PharmD, [facility] 02/18/2023 2:28 AM

## 2023-02-18 NOTE — ED Provider Notes (Signed)
[facility] Provider Note    Event Date/Time   First MD Initiated Contact with Patient 02/18/23 0207     (approximate)   History   Chest Pain   HPI  Cody PENADO Sr. is a 72 y.o. male with history of coronary artery disease status post stent, hypertension, hyperlipidemia who presents to the emergency department with left-sided chest pain that started tonight while getting ready for bed.  He describes it as a pain.  He states it went down his left arm.  No associated shortness of breath, nausea, vomiting, dizziness, diaphoresis.  Has never been a smoker.  Took 5 baby aspirin prior to arrival.  Is pain-free currently.   History provided by patient, wife.    Past Medical History:  Diagnosis Date   Arthritis    lower back    Cancer    prostate cancer    Coronary artery disease    stent RCA- 2000 and 12/2009    GERD (gastroesophageal reflux disease)    Hyperlipidemia    Hypertension    Skin cancer     Past Surgical History:  Procedure Laterality Date   APPENDECTOMY     COLONOSCOPY WITH PROPOFOL N/A 07/18/2017   Procedure: COLONOSCOPY WITH PROPOFOL;  Surgeon: Christena Deem, MD;  Location: [facility] ENDOSCOPY;  Service: Endoscopy;  Laterality: N/A;   CORONARY ANGIOPLASTY     coronary stents      x 2   KNEE ARTHROPLASTY Left 07/20/2017   Procedure: COMPUTER ASSISTED TOTAL KNEE ARTHROPLASTY;  Surgeon: Donato Heinz, MD;  Location: [facility] ORS;  Service: Orthopedics;  Laterality: Left;   KNEE ARTHROPLASTY Right 01/23/2020   Procedure: COMPUTER ASSISTED TOTAL KNEE ARTHROPLASTY;  Surgeon: Donato Heinz, MD;  Location: [facility] ORS;  Service: Orthopedics;  Laterality: Right;   ROBOT ASSISTED LAPAROSCOPIC RADICAL PROSTATECTOMY N/A 07/16/2013   Procedure: ROBOTIC ASSISTED LAPAROSCOPIC RADICAL PROSTATECTOMY LEVEL 2, bilateral pelvic lymphadenectomy;  Surgeon: Crecencio Mc, MD;  Location: [facility] ORS;  Service: Urology;  Laterality: N/A;   TONSILLECTOMY     and  adenoidectomy     MEDICATIONS:  Prior to Admission medications   Medication Sig Start Date End Date Taking? Authorizing Provider  acetaminophen (TYLENOL) 500 MG tablet Take 1,000 mg by mouth 2 (two) times daily as needed.     [provider]  atenolol (TENORMIN) 100 MG tablet Take 50 mg by mouth every morning.    [provider]  b complex vitamins capsule Take 1 capsule by mouth daily.    [provider]  Calcium Carbonate-Vit D-Min (CALTRATE 600+D PLUS PO) Take 1 tablet by mouth daily.     [provider]  Coenzyme Q10 (COQ10) 100 MG CAPS Take 100 mg by mouth at bedtime.     [provider]  enzalutamide Diana Eves) 80 MG tablet Take 2 tablets (160 mg total) by mouth daily. 01/04/23   Remi Haggard, RPH-CPP  felodipine (PLENDIL) 10 MG 24 hr tablet Take 10 mg by mouth at bedtime.     [provider]  fluticasone (FLONASE) 50 MCG/ACT nasal spray SPRAY 2 SPRAYS INTO EACH NOSTRIL EVERY DAY 09/09/21   [provider]  folic acid (FOLVITE) 800 MCG tablet Take 800 mcg by mouth at bedtime.     [provider]  Inositol Niacinate (NIACIN FLUSH FREE) 500 MG CAPS Take 1,000 mg by mouth in the morning and at bedtime.    [provider]  Leuprolide Acetate, 6 Month, (LUPRON DEPOT, 93-MONTH,) 45  MG injection Inject 45 mg into the muscle every 6 (six) months. Patient not taking: Reported on 04/06/2022    [provider]  lisinopril-hydrochlorothiazide (PRINZIDE,ZESTORETIC) 20-12.5 MG per tablet Take 1 tablet by mouth every morning.    [provider]  NON FORMULARY MINERAL OIL--USE FOR CONSTIPATION.    [provider]  omeprazole (PRILOSEC) 20 MG capsule Take 20 mg by mouth daily.    [provider]  rosuvastatin (CRESTOR) 10 MG tablet Take 10 mg by mouth daily.    [provider]  triamcinolone (NASACORT) 55 MCG/ACT AERO nasal inhaler Place 2 sprays into the nose daily.     [provider]  vitamin B-12 (CYANOCOBALAMIN) 1000 MCG tablet Take 1,000 mcg by mouth daily.    [provider]  vitamin E 400 UNIT capsule Take 400 Units by mouth daily.    [provider]    Physical Exam   Triage Vital Signs: ED Triage Vitals  Enc Vitals Group     BP 02/18/23 0047 (!) 166/82     Pulse Rate 02/18/23 0047 70     Resp 02/18/23 0047 18     Temp 02/18/23 0047 98.4 F (36.9 C)     Temp Source 02/18/23 0047 Oral     SpO2 02/18/23 0047 99 %     Weight 02/18/23 0046 210 lb (95.3 kg)     Height 02/18/23 0046 5\' 9"  (1.753 m)     Head Circumference --      Peak Flow --      Pain Score 02/18/23 0046 2     Pain Loc --      Pain Edu? --      Excl. in GC? --     Most recent vital signs: Vitals:   02/18/23 0047  BP: (!) 166/82  Pulse: 70  Resp: 18  Temp: 98.4 F (36.9 C)  SpO2: 99%    CONSTITUTIONAL: Alert, responds appropriately to questions. Well-appearing; well-nourished HEAD: Normocephalic, atraumatic EYES: Conjunctivae clear, pupils appear equal, sclera nonicteric ENT: normal nose; moist mucous membranes NECK: Supple, normal ROM CARD: RRR; S1 and S2 appreciated RESP: Normal chest excursion without splinting or tachypnea; breath sounds clear and equal bilaterally; no wheezes, no rhonchi, no rales, no hypoxia or respiratory distress, speaking full sentences ABD/GI: Non-distended; soft, non-tender, no rebound, no guarding, no peritoneal signs BACK: The back appears normal EXT: Normal ROM in all joints; no deformity noted, no edema, no calf tenderness or calf swelling SKIN: Normal color for age and race; warm; no rash on exposed skin NEURO: Moves all extremities equally, normal speech PSYCH: The patient's mood and manner are appropriate.   ED Results / Procedures / Treatments   LABS: (all labs ordered are listed, but only abnormal results are displayed) Labs Reviewed  BASIC METABOLIC PANEL - Abnormal; Notable for the following  components:      Result Value   Potassium 3.4 (*)    CO2 21 (*)    Glucose, Bld 163 (*)    All other components within normal limits  TROPONIN I (HIGH SENSITIVITY) - Abnormal; Notable for the following components:   Troponin I (High Sensitivity) 333 (*)    All other components within normal limits  CBC  TROPONIN I (HIGH SENSITIVITY)     EKG:  EKG Interpretation  Date/Time:  Friday February 18 2023 00:45:57 EDT Ventricular Rate:  70 PR Interval:  204 QRS Duration: 92 QT Interval:  408 QTC Calculation: 440 R Axis:  21 Text Interpretation: Normal sinus rhythm Cannot rule out Anterior infarct , age undetermined Abnormal ECG When compared with ECG of 15-Jan-2020 13:13, T wave inversion now evident in Inferior leads Nonspecific T wave abnormality now evident in Lateral leads Confirmed by Rochele Raring [phone]) on 02/18/2023 2:09:06 AM         RADIOLOGY: My personal review and interpretation of imaging: Chest x-ray clear.  I have personally reviewed all radiology reports.   DG Chest 1 View  Result Date: 02/18/2023 CLINICAL DATA:  [id number] Chest pain [id number].  Shortness of breath. EXAM: CHEST  1 VIEW COMPARISON:  Chest x-ray 07/04/2013, PET CT 03/10/2021 FINDINGS: The heart and mediastinal contours are within normal limits. No focal consolidation. No pulmonary edema. No pleural effusion. No pneumothorax. No acute osseous abnormality. IMPRESSION: No active disease. Electronically Signed   By: Tish Frederickson M.D.   On: 02/18/2023 01:25     PROCEDURES:  Critical Care performed: Yes, see critical care procedure note(s)   CRITICAL CARE Performed by: Baxter Hire Dominick Morella   Total critical care time: 40 minutes  Critical care time was exclusive of separately billable procedures and treating other patients.  Critical care was necessary to treat or prevent imminent or life-threatening deterioration.  Critical care was time spent personally by me on the following activities: development of  treatment plan with patient and/or surrogate as well as nursing, discussions with consultants, evaluation of patient's response to treatment, examination of patient, obtaining history from patient or surrogate, ordering and performing treatments and interventions, ordering and review of laboratory studies, ordering and review of radiographic studies, pulse oximetry and re-evaluation of patient's condition.   [name]1-3 Lead EKG Interpretation  Performed by: Tiegan Terpstra, Layla Maw, DO Authorized by: Davinia Riccardi, Layla Maw, DO     Interpretation: normal     ECG rate:  70   ECG rate assessment: normal     Rhythm: sinus rhythm     Ectopy: none     Conduction: normal       IMPRESSION / MDM / ASSESSMENT AND PLAN / ED COURSE  I reviewed the triage vital signs and the nursing notes.    Patient here with chest pain.  History of coronary artery disease status post stent.  Pain-free currently.  The patient is on the cardiac monitor to evaluate for evidence of arrhythmia and/or significant heart rate changes.   DIFFERENTIAL DIAGNOSIS (includes but not limited to):   ACS, PE, dissection, pneumonia, pneumothorax, CHF   Patient's presentation is most consistent with acute presentation with potential threat to life or bodily function.   PLAN/COURSE: Workup initiated from triage.  Patient's initial EKG showed ischemic changes but no ST elevation.  Troponin has come back elevated at 333.  Normal hemoglobin, creatinine.  Chest x-ray reviewed and interpreted by myself and the radiologist and shows no acute abnormality.  Patient reports he is now pain-free.  Repeat EKG shows no ischemic abnormality.  Will start heparin.  He has already had full dose aspirin at home.  Will discuss with hospitalist for admission.  Patient's cardiologist is Dr. Darrold Junker.   MEDICATIONS GIVEN IN ED: Medications - No data to display      CONSULTS:  Consulted and discussed patient's case with hospitalist, Dr. Arville Care.  I have  recommended admission and consulting physician agrees and will place admission orders.  Patient (and family if present) agree with this plan.   I reviewed all nursing notes, vitals, pertinent previous records.  All labs, EKGs, imaging ordered have been independently reviewed  and interpreted by myself.    OUTSIDE RECORDS REVIEWED: Reviewed previous cardiology notes.       FINAL CLINICAL IMPRESSION(S) / ED DIAGNOSES   Final diagnoses:  NSTEMI (non-ST elevated myocardial infarction)     Rx / DC Orders   ED Discharge Orders     None        Note:  This document was prepared using Dragon voice recognition software and may include unintentional dictation errors.   Janee Ureste, Layla Maw, DO 02/18/23 [phone]

## 2023-02-18 NOTE — Assessment & Plan Note (Addendum)
-   The patient will be admitted to a progressive unit bed. - We will continue IV heparin. - He will be placed on aspirin as well as as needed sublingual nitroglycerin and IV morphine sulfate for pain. - We will continue beta-blocker therapy with Tenormin. - 2D echo will be obtained. - Cardiology consult will be obtained. - I notified Dr. Darrold Junker about the patient. - I will hold off Xtandi for now given his MI.

## 2023-02-18 NOTE — ED Triage Notes (Signed)
Patient ambulatory to triage with steady gait, without difficulty or distress noted; pt reports since about 10pm having left sided CP radiating into axillae and arm accomp by [facility]; took 5-81mg  ASA PTA; st pain increases when lying supine

## 2023-02-18 NOTE — H&P (Signed)
[facility]   PATIENT NAME: Cody Ellison    MR#:  [id number]  DATE OF BIRTH:  1951-08-24  DATE OF ADMISSION:  02/18/2023  PRIMARY CARE PHYSICIAN: Danella Penton, MD   Patient is coming from: Home  REQUESTING/REFERRING PHYSICIAN: Ward, Layla Maw, DO  CHIEF COMPLAINT:   Chief Complaint  Patient presents with   Chest Pain    HISTORY OF PRESENT ILLNESS:  Cody VANDERWALL Sr. is a 72 y.o. male with medical history significant for GERD, hypertension, dyslipidemia, coronary artery disease, prostate cancer on Xtandi, presented to the emergency room with acute onset of left-sided chest pain as well as left infra axillary pain graded 6-7/10 in severity with radiation to his left arm and palpitations as well as dyspnea.  No nausea or vomiting or diaphoresis.  No neck pain or edema or recent travels or surgeries.  No fever or chills.  No cough or wheezing no dysuria, oliguria or hematuria or flank pain.  No bleeding diathesis.  ED Course: When the patient came to the ER, BP was 166/82 with otherwise normal vital signs.  Labs revealed mild hypokalemia 3.4 glucose 163 with a CO2 of 20.  Initial troponin was 333 L 1 485.  CBC was normal.  Coagulation profile was normal. EKG as reviewed by me : Normal sinus rhythm with a rate of 70 Imaging: Portable chest x-ray showed no acute cardiopulmonary disease.  The patient took a total of 5 baby aspirin.  It was started in the ER IV heparin with bolus and drip.  He will be admitted to a progressive unit bed for further evaluation and management PAST MEDICAL HISTORY:   Past Medical History:  Diagnosis Date   Arthritis    lower back    Cancer    prostate cancer    Coronary artery disease    stent RCA- 2000 and 12/2009    GERD (gastroesophageal reflux disease)    Hyperlipidemia    Hypertension    Skin cancer     PAST SURGICAL HISTORY:   Past Surgical History:  Procedure Laterality Date   APPENDECTOMY     COLONOSCOPY WITH PROPOFOL N/A  07/18/2017   Procedure: COLONOSCOPY WITH PROPOFOL;  Surgeon: Christena Deem, MD;  Location: [facility] ENDOSCOPY;  Service: Endoscopy;  Laterality: N/A;   CORONARY ANGIOPLASTY     coronary stents      x 2   KNEE ARTHROPLASTY Left 07/20/2017   Procedure: COMPUTER ASSISTED TOTAL KNEE ARTHROPLASTY;  Surgeon: Donato Heinz, MD;  Location: [facility] ORS;  Service: Orthopedics;  Laterality: Left;   KNEE ARTHROPLASTY Right 01/23/2020   Procedure: COMPUTER ASSISTED TOTAL KNEE ARTHROPLASTY;  Surgeon: Donato Heinz, MD;  Location: [facility] ORS;  Service: Orthopedics;  Laterality: Right;   ROBOT ASSISTED LAPAROSCOPIC RADICAL PROSTATECTOMY N/A 07/16/2013   Procedure: ROBOTIC ASSISTED LAPAROSCOPIC RADICAL PROSTATECTOMY LEVEL 2, bilateral pelvic lymphadenectomy;  Surgeon: Crecencio Mc, MD;  Location: [facility] ORS;  Service: Urology;  Laterality: N/A;   TONSILLECTOMY     and adenoidectomy     SOCIAL HISTORY:   Social History   Tobacco Use   Smoking status: Former    Packs/day: 1.00    Years: 30.00    Additional pack years: 0.00    Total pack years: 30.00    Types: Cigarettes    Quit date: 11/01/1992    Years since quitting: 30.3   Smokeless tobacco: Never  Substance Use Topics   Alcohol use: No    FAMILY HISTORY:  Family History  Problem Relation Age of Onset   Cancer Mother 9       cervical cancer   Heart attack Father    Thyroid cancer Daughter 16       doing well    DRUG ALLERGIES:   Allergies  Allergen Reactions   Ampicillin Other (See Comments)    Raw mouth and throat    Atorvastatin     Other reaction(s): Unknown   Carvedilol     Other reaction(s): Dizziness   Contrast Media [Iodinated Contrast Media] Hives   Diltiazem Hcl Other (See Comments)   Fluvastatin Other (See Comments)   Lovastatin Other (See Comments)   Sulfa Antibiotics Hives   Betadine [Povidone Iodine] Rash   Celecoxib Palpitations    REVIEW OF SYSTEMS:   ROS As per history of present illness. All pertinent systems  were reviewed above. Constitutional, HEENT, cardiovascular, respiratory, GI, GU, musculoskeletal, neuro, psychiatric, endocrine, integumentary and hematologic systems were reviewed and are otherwise negative/unremarkable except for positive findings mentioned above in the HPI.   MEDICATIONS AT HOME:   Prior to Admission medications   Medication Sig Start Date End Date Taking? Authorizing Provider  acetaminophen (TYLENOL) 500 MG tablet Take 1,000 mg by mouth 2 (two) times daily as needed.    Yes [provider]  atenolol (TENORMIN) 100 MG tablet Take 50 mg by mouth every morning.   Yes [provider]  b complex vitamins capsule Take 1 capsule by mouth daily.   Yes [provider]  Calcium Carbonate-Vit D-Min (CALTRATE 600+D PLUS PO) Take 1 tablet by mouth daily.    Yes [provider]  Coenzyme Q10 (COQ10) 100 MG CAPS Take 100 mg by mouth at bedtime.    Yes [provider]  enzalutamide (XTANDI) 80 MG tablet Take 2 tablets (160 mg total) by mouth daily. 01/04/23  Yes Remi Haggard, RPH-CPP  felodipine (PLENDIL) 10 MG 24 hr tablet Take 10 mg by mouth at bedtime.    Yes [provider]  fluticasone (FLONASE) 50 MCG/ACT nasal spray SPRAY 2 SPRAYS INTO EACH NOSTRIL EVERY DAY 09/09/21  Yes [provider]  folic acid (FOLVITE) 800 MCG tablet Take 800 mcg by mouth at bedtime.    Yes [provider]  Inositol Niacinate (NIACIN FLUSH FREE) 500 MG CAPS Take 1,000 mg by mouth in the morning and at bedtime.   Yes [provider]  Leuprolide Acetate, 6 Month, (LUPRON DEPOT, 16-MONTH,) 45 MG injection Inject 45 mg into the muscle every 6 (six) months.   Yes [provider]  lisinopril-hydrochlorothiazide (PRINZIDE,ZESTORETIC) 20-12.5 MG per tablet Take 1 tablet by mouth every morning.   Yes [provider]  NON FORMULARY MINERAL OIL--USE FOR CONSTIPATION.   Yes [provider]  omeprazole (PRILOSEC) 20  MG capsule Take 20 mg by mouth daily.   Yes [provider]  rosuvastatin (CRESTOR) 10 MG tablet Take 10 mg by mouth daily.   Yes [provider]  vitamin B-12 (CYANOCOBALAMIN) 1000 MCG tablet Take 1,000 mcg by mouth daily.   Yes [provider]  vitamin E 400 UNIT capsule Take 400 Units by mouth daily.   Yes [provider]  triamcinolone (NASACORT) 55 MCG/ACT AERO nasal inhaler Place 2 sprays into the nose daily.  Patient not taking: Reported on 02/18/2023    [provider]      VITAL SIGNS:  Blood pressure (!) 170/80, pulse 65, temperature 98.4 F (36.9 C), temperature source Oral, resp.  rate 18, height  (1.753 m), weight 95.3 kg, SpO2 96 %.  PHYSICAL EXAMINATION:  Physical Exam  GENERAL:  72 y.o.-year-old patient lying in the bed with no acute distress.  EYES: Pupils equal, round, reactive to light and accommodation. No scleral icterus. Extraocular muscles intact.  HEENT: Head atraumatic, normocephalic. Oropharynx and nasopharynx clear.  NECK:  Supple, no jugular venous distention. No thyroid enlargement, no tenderness.  LUNGS: Normal breath sounds bilaterally, no wheezing, rales,rhonchi or crepitation. No use of accessory muscles of respiration.  CARDIOVASCULAR: Regular rate and rhythm, S1, S2 normal. No murmurs, rubs, or gallops.  ABDOMEN: Soft, nondistended, nontender. Bowel sounds present. No organomegaly or mass.  EXTREMITIES: No pedal edema, cyanosis, or clubbing.  NEUROLOGIC: Cranial nerves II through XII are intact. Muscle strength 5/5 in all extremities. Sensation intact. Gait not checked.  PSYCHIATRIC: The patient is alert and oriented x 3.  Normal affect and good eye contact. SKIN: No obvious rash, lesion, or ulcer.   LABORATORY PANEL:   CBC Recent Labs  Lab 02/18/23 0502  WBC 6.7  HGB 13.6  HCT 39.8  PLT 176    ------------------------------------------------------------------------------------------------------------------  Chemistries  Recent Labs  Lab 02/18/23 0048  NA 138  K 3.4*  CL 105  CO2 21*  GLUCOSE 163*  BUN 18  CREATININE 1.08  CALCIUM 9.7   ------------------------------------------------------------------------------------------------------------------  Cardiac Enzymes No results for input(s): "TROPONINI" in the last 168 hours. ------------------------------------------------------------------------------------------------------------------  RADIOLOGY:  DG Chest 1 View  Result Date: 02/18/2023 CLINICAL DATA:  161096 Chest pain 644799.  Shortness of breath. EXAM: CHEST  1 VIEW COMPARISON:  Chest x-ray 07/04/2013, PET CT 03/10/2021 FINDINGS: The heart and mediastinal contours are within normal limits. No focal consolidation. No pulmonary edema. No pleural effusion. No pneumothorax. No acute osseous abnormality. IMPRESSION: No active disease. Electronically Signed   By: Tish Frederickson M.D.   On: 02/18/2023 01:25      IMPRESSION AND PLAN:  Assessment and Plan: * Non-STEMI (non-ST elevated myocardial infarction) - The patient will be admitted to a progressive unit bed. - We will continue IV heparin. - He will be placed on aspirin as well as as needed sublingual nitroglycerin and IV morphine sulfate for pain. - We will continue beta-blocker therapy with Tenormin. - 2D echo will be obtained. - Cardiology consult will be obtained. - I notified Dr. Darrold Junker about the patient. - I will hold off Xtandi for now given his MI.  Essential hypertension - We will continue his antihypertensives.  Dyslipidemia - We will continue statin therapy at a higher dose and check fasting lipids.   DVT prophylaxis: IV heparin. Advanced Care Planning:  Code Status: full code.  Family Communication:  The plan of care was discussed in details with the patient (and family). I answered  all questions. The patient agreed to proceed with the above mentioned plan. Further management will depend upon hospital course. Disposition Plan: Back to previous home environment Consults called: Cardiology.   All the records are reviewed and case discussed with ED provider.  Status is: Inpatient Remains inpatient appropriate because:   At the time of the admission, it appears that the appropriate admission status for this patient is inpatient.  This is judged to be reasonable and necessary in order to provide the required intensity of service to ensure the patient's safety given the presenting symptoms, physical exam findings and initial radiographic and laboratory data in the context of comorbid conditions.  The patient requires inpatient status due to high intensity of  service, high risk of further deterioration and high frequency of surveillance required.  I certify that at the time of admission, it is my clinical judgment that the patient will require inpatient hospital care extending more than 2 midnights.                            Dispo: The patient is from: Home              Anticipated d/c is to: Home              Patient currently is not medically stable to d/c.              Difficult to place patient: No  Hannah Beat M.D on 02/18/2023 at 5:38 AM  [facility]   From 7 PM-7 AM, contact night-coverage [email or web address]  CC: Primary care physician; Danella Penton, MD

## 2023-02-18 NOTE — ED Notes (Addendum)
Lab reports troponin results 333; acuity level changed; charge nurse notified; will take pt to next available exam room

## 2023-02-18 NOTE — ED Notes (Signed)
Report given [facility] for transport at this time.

## 2023-02-18 NOTE — Assessment & Plan Note (Addendum)
-   We will continue his antihypertensives. 

## 2023-02-18 NOTE — Discharge Summary (Signed)
[facility] Discharge Summary   Patient: Marko Skalski QQV:[id number]  PCP: Danella Penton, MD  Date of admission: 02/18/2023   Date of discharge:  02/18/2023     Discharge Diagnoses:  Principal Problem:   Non-STEMI (non-ST elevated myocardial infarction) Active Problems:   Essential hypertension   Dyslipidemia   NSTEMI (non-ST elevated myocardial infarction)   Admitted From: Home Disposition: Transfer to [facility] under cardiology service, accepting physician Dr. Chales Abrahams  Recommendations for Outpatient Follow-up:  Follow instructions after discharge from [facility] Follow up LABS/TEST:     Diet recommendation: Cardiac diet  Activity: The patient is advised to gradually reintroduce usual activities, as tolerated  Discharge Condition: stable  Code Status: Full code   History of present illness: As per the H and P dictated on admission Hospital Course:  JERMELL HOLEMAN Sr. is a 72 y.o. male with medical history significant for GERD, hypertension, dyslipidemia, coronary artery disease, prostate cancer on Xtandi, presented to the emergency room with acute onset of left-sided chest pain as well as left infra axillary pain graded 6-7/10 in severity with radiation to his left arm and palpitations as well as dyspnea.  No nausea or vomiting or diaphoresis.  No neck pain or edema or recent travels or surgeries.  No fever or chills.  No cough or wheezing no dysuria, oliguria or hematuria or flank pain.  No bleeding diathesis.   ED Course: When the patient came to the ER, BP was 166/82 with otherwise normal vital signs.  Labs revealed mild hypokalemia 3.4 glucose 163 with a CO2 of 20.  Initial troponin was 333 L 1 485.  CBC was normal.  Coagulation profile was normal. EKG as reviewed by me : Normal sinus rhythm with a rate of 70 Imaging: Portable chest x-ray showed no acute cardiopulmonary disease.   The patient took a total of 5 baby aspirin.  It was started in the ER IV heparin with  bolus and drip.  He will be admitted to a progressive unit bed for further evaluation and management  Assessment and Plan:  # Non-STEMI (non-ST elevated myocardial infarction) Continue heparin IV infusion, aspirin and nitroglycerin as needed. Continue atenolol home dose  Patient was seen by cardiology, recommended to transfer to [facility] due to an embolus CAD.  Patient got accepted at [facility] cardiology, accepting physician Dr. Allena Katz, [facility].  Patient agreed with transfer to [facility] and further management over there.  # Essential hypertension and hyperlipidemia, elevated triglycerides Continue current treatment, monitor BP and titrate medications accordingly.  Body mass index is 31.01 kg/m.  Nutrition Interventions:   On the day of the discharge the patient's vitals were stable, and no other acute medical condition were reported by patient. the patient was felt safe to be transferred to [facility] under cardiology service.   Consultants: Cardiologist  Procedures: None  Discharge Exam: General: Appear in no distress, no Rash; Oral Mucosa Clear, moist. Cardiovascular: S1 and S2 Present, no Murmur, Respiratory: normal respiratory effort, Bilateral Air entry present and no Crackles, no wheezes Abdomen: Bowel Sound present, Soft and no tenderness, no hernia Extremities: no Pedal edema, no calf tenderness Neurology: alert and oriented to time, place, and person affect appropriate.  Filed Weights   02/18/23 0046  Weight: 95.3 kg   Vitals:   02/18/23 0920 02/18/23 0930  BP:  (!) 168/76  Pulse:  63  Resp:  14  Temp: 97.8 F (36.6 C)   SpO2:  98%    DISCHARGE MEDICATION: Allergies  as of 02/18/2023       Reactions   Ampicillin Other (See Comments)   Raw mouth and throat    Atorvastatin    Other reaction(s): Unknown   Carvedilol    Other reaction(s): Dizziness   Contrast Media [iodinated Contrast Media] Hives   Diltiazem Hcl Other (See Comments)   Fluvastatin Other (See Comments)    Lovastatin Other (See Comments)   Sulfa Antibiotics Hives   Betadine [povidone Iodine] Rash   Celecoxib Palpitations        Medication List     TAKE these medications    acetaminophen 500 MG tablet Commonly known as: TYLENOL Take 1,000 mg by mouth 2 (two) times daily as needed.   aspirin EC 81 MG tablet Take 81 mg by mouth daily. Swallow whole.   atenolol 100 MG tablet Commonly known as: TENORMIN Take 50 mg by mouth every morning.   b complex vitamins capsule Take 1 capsule by mouth daily.   CALTRATE 600+D PLUS PO Take 1 tablet by mouth daily.   CoQ10 100 MG Caps Take 100 mg by mouth at bedtime.   cyanocobalamin 1000 MCG tablet Commonly known as: VITAMIN B12 Take 1,000 mcg by mouth daily.   enzalutamide 80 MG tablet Commonly known as: Xtandi Take 2 tablets (160 mg total) by mouth daily.   felodipine 10 MG 24 hr tablet Commonly known as: PLENDIL Take 10 mg by mouth at bedtime.   fluticasone 50 MCG/ACT nasal spray Commonly known as: FLONASE SPRAY 2 SPRAYS INTO EACH NOSTRIL EVERY DAY   folic acid 800 MCG tablet Commonly known as: FOLVITE Take 800 mcg by mouth at bedtime.   heparin 16109 UT/250ML infusion Inject 1,200 Units/hr into the vein continuous.   lisinopril-hydrochlorothiazide 20-12.5 MG tablet Commonly known as: ZESTORETIC Take 1 tablet by mouth every morning.   Lupron Depot (49-Month) 45 MG injection Generic drug: Leuprolide Acetate (6 Month) Inject 45 mg into the muscle every 6 (six) months.   Niacin Flush Free 500 MG Caps Generic drug: Inositol Niacinate Take 1,000 mg by mouth in the morning and at bedtime.   nitroGLYCERIN 0.4 MG SL tablet Commonly known as: NITROSTAT Place 1 tablet (0.4 mg total) under the tongue every 5 (five) minutes x 3 doses as needed for chest pain.   NON FORMULARY MINERAL OIL--USE FOR CONSTIPATION.   omeprazole 20 MG capsule Commonly known as: PRILOSEC Take 20 mg by mouth daily.   rosuvastatin 10 MG  tablet Commonly known as: CRESTOR Take 10 mg by mouth daily.   triamcinolone 55 MCG/ACT Aero nasal inhaler Commonly known as: NASACORT Place 2 sprays into the nose daily.   vitamin E 180 MG (400 UNITS) capsule Take 400 Units by mouth daily.       Allergies  Allergen Reactions   Ampicillin Other (See Comments)    Raw mouth and throat    Atorvastatin     Other reaction(s): Unknown   Carvedilol     Other reaction(s): Dizziness   Contrast Media [Iodinated Contrast Media] Hives   Diltiazem Hcl Other (See Comments)   Fluvastatin Other (See Comments)   Lovastatin Other (See Comments)   Sulfa Antibiotics Hives   Betadine [Povidone Iodine] Rash   Celecoxib Palpitations   Discharge Instructions     Call MD for:  difficulty breathing, headache or visual disturbances   Complete by: As directed    Call MD for:  extreme fatigue   Complete by: As directed    Call MD for:  persistant  dizziness or light-headedness   Complete by: As directed    Call MD for:  persistant nausea and vomiting   Complete by: As directed    Call MD for:  severe uncontrolled pain   Complete by: As directed    Call MD for:  temperature >100.4   Complete by: As directed    Diet - low sodium heart healthy   Complete by: As directed    Discharge instructions   Complete by: As directed    Follow discharge instructions after discharge from [facility]   Increase activity slowly   Complete by: As directed        The results of significant diagnostics from this hospitalization (including imaging, microbiology, ancillary and laboratory) are listed below for reference.    Significant Diagnostic Studies: DG Chest 1 View  Result Date: 02/18/2023 CLINICAL DATA:  161096 Chest pain 644799.  Shortness of breath. EXAM: CHEST  1 VIEW COMPARISON:  Chest x-ray 07/04/2013, PET CT 03/10/2021 FINDINGS: The heart and mediastinal contours are within normal limits. No focal consolidation. No pulmonary edema. No pleural effusion.  No pneumothorax. No acute osseous abnormality. IMPRESSION: No active disease. Electronically Signed   By: Tish Frederickson M.D.   On: 02/18/2023 01:25    Microbiology: No results found for this or any previous visit (from the past 240 hour(s)).   Labs: CBC: Recent Labs  Lab 02/18/23 0048 02/18/23 0502  WBC 7.2 6.7  HGB 14.4 13.6  HCT 43.3 39.8  MCV 94.7 92.8  PLT 188 176   Basic Metabolic Panel: Recent Labs  Lab 02/18/23 0048 02/18/23 0245 02/18/23 0502  NA 138  --  140  K 3.4*  --  3.5  CL 105  --  105  CO2 21*  --  25  GLUCOSE 163*  --  128*  BUN 18  --  16  CREATININE 1.08  --  0.99  CALCIUM 9.7  --  9.8  MG  --  2.2  --   PHOS  --  3.4  --    Liver Function Tests: No results for input(s): "AST", "ALT", "ALKPHOS", "BILITOT", "PROT", "ALBUMIN" in the last 168 hours. No results for input(s): "LIPASE", "AMYLASE" in the last 168 hours. No results for input(s): "AMMONIA" in the last 168 hours. Cardiac Enzymes: No results for input(s): "CKTOTAL", "CKMB", "CKMBINDEX", "TROPONINI" in the last 168 hours. BNP (last 3 results) No results for input(s): "BNP" in the last 8760 hours. CBG: No results for input(s): "GLUCAP" in the last 168 hours.  Time spent: 35 minutes  Signed:  Gillis Santa  [facility] 02/18/2023 10:04 AM

## 2023-02-18 NOTE — Assessment & Plan Note (Addendum)
-   We will continue statin therapy at a higher dose and check fasting lipids.

## 2023-02-18 NOTE — Consult Note (Signed)
[facility] CLINIC CARDIOLOGY CONSULT NOTE       Patient ID: Cody Sox Sr. MRN: [id number] DOB/AGE: [birth year] 72 y.o.  Admit date: 02/18/2023 Referring Physician Dr. Valente David Primary Physician Dr. Bethann Punches  Primary Cardiologist Dr. Darrold Junker Reason for Consultation NSTEMI  HPI: Cody Sox Sr. 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 ED the evening of 02/17/2023 with chest pain similar to his prior angina.  Troponins elevated to a peak of 1000 ruling in non-STEMI for which cardiology is consulted for further assistance.  The patient states he was getting ready to go to bed around 1030 yesterday evening when he suddenly experienced left-sided chest pressure that radiated into his armpit, and down his left arm.  He tried to sit up and move around without significant relief and only progression of his discomfort.  He had associated shortness of breath and "just knew something was not right."  He does not typically have angina and does not have a prescription for sublingual nitroglycerin at home.  He took 5 baby aspirin with eventual relief of his chest pain.  He has felt somewhat tired in general over the past couple of weeks, attributing this to his oral chemo he takes for prostate cancer.  Despite this, he was able to mow the lawn a couple days ago without angina or discomfort.  He otherwise has occasional heart fluttering which is unchanged and has been intermittent for years.  He denies orthopnea, peripheral edema or presyncope.  At my time of evaluation this morning he is comfortable and conversational, chest pain-free.  The patient is well-known to Dr. Darrold Junker and has been under his care for the past 30 years, known to have  an anomalous RCA which originates in the left coronary cusp and his last heart catheterization was technically difficult and required complex PCI at [facility] because of this.  Recent vitals are notable for a blood pressure of 208/88, heart rate in the 60s in sinus rhythm on telemetry, he is comfortable on room air saturating well.  Labs are notable for a potassium of 3.5, BUN/creatinine 16/0.99 and GFR >60.  Magnesium within normal limits at 2.2.  High-sensitivity troponin elevated and uptrending at 333, 485, to current peak of 1037.  TC 187, HDL less than 30, hypertriglyceridemia to 565.  H&H with normal normal limits at 13.6, 39.8, platelets 176.  Chest x-ray without active cardiopulmonary disease.  Initial EKG demonstrates sinus rhythm with new T wave inversion in lead II and lead III and slight T wave flattening laterally without progressive changes on serial EKGs x 2.   Review of systems complete and found to be negative unless listed above     Past Medical History:  Diagnosis Date   Arthritis    lower back    Cancer    prostate cancer    Coronary artery disease    stent RCA- 2000 and 12/2009    GERD (gastroesophageal reflux disease)    Hyperlipidemia    Hypertension    Skin cancer     Past Surgical History:  Procedure Laterality Date   APPENDECTOMY     COLONOSCOPY WITH PROPOFOL N/A 07/18/2017   Procedure: COLONOSCOPY WITH PROPOFOL;  Surgeon: Christena Deem, MD;  Location: [facility] ENDOSCOPY;  Service: Endoscopy;  Laterality: N/A;   CORONARY ANGIOPLASTY     coronary stents      x 2   KNEE ARTHROPLASTY Left 07/20/2017   Procedure: COMPUTER ASSISTED TOTAL KNEE ARTHROPLASTY;  Surgeon: Donato Heinz, MD;  Location: [facility] ORS;  Service: Orthopedics;  Laterality: Left;   KNEE ARTHROPLASTY Right 01/23/2020   Procedure: COMPUTER ASSISTED TOTAL KNEE ARTHROPLASTY;  Surgeon: Donato Heinz, MD;  Location: [facility] ORS;  Service: Orthopedics;  Laterality: Right;   ROBOT ASSISTED  LAPAROSCOPIC RADICAL PROSTATECTOMY N/A 07/16/2013   Procedure: ROBOTIC ASSISTED LAPAROSCOPIC RADICAL PROSTATECTOMY LEVEL 2, bilateral pelvic lymphadenectomy;  Surgeon: Crecencio Mc, MD;  Location: [facility] ORS;  Service: Urology;  Laterality: N/A;   TONSILLECTOMY     and adenoidectomy     (Not in a hospital admission)  Social History   Socioeconomic History   Marital status: Married    Spouse name: Not on file   Number of children: Not on file   Years of education: Not on file   Highest education level: Not on file  Occupational History   Not on file  Tobacco Use   Smoking status: Former    Packs/day: 1.00    Years: 30.00    Additional pack years: 0.00    Total pack years: 30.00    Types: Cigarettes    Quit date: 11/01/1992    Years since quitting: 30.3   Smokeless tobacco: Never  Vaping Use   Vaping Use: Never used  Substance and Sexual Activity   Alcohol use: No   Drug use: No   Sexual activity: Not on file  Other Topics Concern   Not on file  Social History Narrative   Not on file   Social Determinants of Health   Financial Resource Strain: Not on file  Food Insecurity: Not on file  Transportation Needs: Not on file  Physical Activity: Not on file  Stress: Not on file  Social Connections: Not on file  Intimate Partner Violence: Not on file    Family History  Problem Relation Age of Onset   Cancer Mother 68       cervical cancer   Heart attack Father    Thyroid cancer Daughter 36       doing well     No intake or output data in the 24 hours ending 02/18/23 0815  Vitals:   02/18/23 0539 02/18/23 0600 02/18/23 0630 02/18/23 0700  BP:  (!) 175/78 (!) 171/75 (!) 168/78  Pulse:  65 66 65  Resp:  Temp: 97.8 F (36.6 C)     TempSrc: Oral     SpO2:  97% 95% 96%  Weight:      Height:        PHYSICAL EXAM General: Pleasant conversational Caucasian male, well nourished, in no acute distress.  Sitting at incline in ED stretcher, wife at bedside. HEENT:   Normocephalic and atraumatic. Neck:  No JVD.  Lungs: Normal respiratory effort on room air. Clear bilaterally to auscultation. No wheezes, crackles, rhonchi.  Heart: HRRR . Normal S1 and S2 without gallops or murmurs.  Abdomen: Non-distended appearing with excess adiposity.  Msk: Normal strength and tone for age.  Kyphosis Extremities: Warm and well perfused. No clubbing, cyanosis.  No peripheral edema.  Neuro: Alert and oriented X 3. Psych:  Answers questions appropriately.   Labs: Basic Metabolic Panel: Recent Labs    02/18/23 0048 02/18/23 0502  NA 138 140  K  3.4* 3.5  CL 105 105  CO2 21* 25  GLUCOSE 163* 128*  BUN 18 16  CREATININE 1.08 0.99  CALCIUM 9.7 9.8   Liver Function Tests: No results for input(s): "AST", "ALT", "ALKPHOS", "BILITOT", "PROT", "ALBUMIN" in the last 72 hours. No results for input(s): "LIPASE", "AMYLASE" in the last 72 hours. CBC: Recent Labs    02/18/23 0048 02/18/23 0502  WBC 7.2 6.7  HGB 14.4 13.6  HCT 43.3 39.8  MCV 94.7 92.8  PLT 188 176   Cardiac Enzymes: Recent Labs    02/18/23 0048 02/18/23 0245 02/18/23 0502  TROPONINIHS 333* 485* 1,037*   BNP: No results for input(s): "BNP" in the last 72 hours. D-Dimer: No results for input(s): "DDIMER" in the last 72 hours. Hemoglobin A1C: No results for input(s): "HGBA1C" in the last 72 hours. Fasting Lipid Panel: Recent Labs    02/18/23 0502  CHOL 187  HDL 30*  LDLCALC UNABLE TO CALCULATE IF TRIGLYCERIDE OVER 400 mg/dL  TRIG 161*  CHOLHDL 6.2   Thyroid Function Tests: No results for input(s): "TSH", "T4TOTAL", "T3FREE", "THYROIDAB" in the last 72 hours.  Invalid input(s): "FREET3" Anemia Panel: No results for input(s): "VITAMINB12", "FOLATE", "FERRITIN", "TIBC", "IRON", "RETICCTPCT" in the last 72 hours.   Radiology: DG Chest 1 View  Result Date: 02/18/2023 CLINICAL DATA:  096045 Chest pain [id number].  Shortness of breath. EXAM: CHEST  1 VIEW COMPARISON:  Chest x-ray  07/04/2013, PET CT 03/10/2021 FINDINGS: The heart and mediastinal contours are within normal limits. No focal consolidation. No pulmonary edema. No pleural effusion. No pneumothorax. No acute osseous abnormality. IMPRESSION: No active disease. Electronically Signed   By: Tish Frederickson M.D.   On: 02/18/2023 01:25    LHC 01/09/2010 [facility]  CATHETERIZATION LABORATORY  Jeanice Lim, [place]   MYKALE, GANDOLFO  MRN: [id number]  DOB: 20-Feb-1951  IP  Procedure Date: 01/09/2010  Cine #: [id number]  Referring Cardiologist: Marcina Millard, MD PhD [facility] [place] underwent the following procedures:    Balloon PTCA - primary vessel    Drug eluting stent - single vessel    Aortic pressure measurement   INDICATION(S) FOR Neta Mends (*indicates primary reason for catheterization)    * 410.71 - AMI, Subendocardial (NSTEMI), initial episode of care    402.10 - Hypertensive heart disease, benign, w/o heart failure    414.01 - Coronary atherosclerosis, of native vessel    786.51 - Precordial chest pain   ENCOUNTER CATEGORY    Elective scheduled PCI   DIAGNOSTIC SUMMARY    Coronary Artery Disease      Left Main:  not visualized      LAD system:  not visualized      LCX system:  not visualized      RCA system:  significant   INTERVENTIONAL SUMMARY    Lesions Attempted:  1      Lesion #1:   Prox RCA 90%  (pre) to 10%  (post)   FINAL DIAGNOSIS    410.71  AMI, Subendocardial (NSTEMI), initial episode of care    414.02  Coronary atherosclerosis, of autologous biological bypass graft    786.51  Precordial chest pain    402.10  Hypertensive heart disease, benign, w/o heart failure   COMMENT   90% mid-RCA lesion: RCA originating from L coronary cusp.  Access via a JL      5.0 guide with a Stabilizer wire with excellent visualization and      interventional support.  Pre-dilation with a  2.25/12mm Sprinter Legend      balloon followed by 3.0/28mm Xience V DES with excellent angiographic      result  and no apparent complications.      Leana Roe, MD, was personally present as the attending physician      throughout the entire procedure.     __________________________________     __________________________________    Leana Roe, MD               Marlene Lard, MD   ECHO 07/25/2020 NORMAL LEFT VENTRICULAR SYSTOLIC FUNCTION  NORMAL RIGHT VENTRICULAR SYSTOLIC FUNCTION  MILD VALVULAR REGURGITATION (See above)  NO VALVULAR STENOSIS  TRIVIAL MR, TR  MILD PR  EF 50-55%  _________________________________________________________________________________________  Electronically signed by      MD Jamse Mead on 07/28/2020 02: 41 PM           Performed By: Merla Riches, RDCS     Ordering Physician: Leanora Ivanoff    TELEMETRY reviewed by me (LT) 02/18/2023 : NSR 60-70s  EKG reviewed by me: sinus rhythm with new T wave inversion in lead II and lead III and slight T wave flattening laterally without progressive changes on serial EKGs x 2.  Data reviewed by me (LT) 02/18/2023: Last outpatient cardiology note, ED note, admission H&P last 24h vitals tele labs imaging I/O   Principal Problem:   Non-STEMI (non-ST elevated myocardial infarction) Active Problems:   Essential hypertension   Dyslipidemia   NSTEMI (non-ST elevated myocardial infarction)    ASSESSMENT AND PLAN:  Cody Sox Sr. 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  Troponins elevated to a peak of 1000 ruling in non-STEMI for which cardiology is consulted for further assistance.  # NSTEMI # CAD s/p PCI with DES x 2 RCA Presents with intense left-sided chest pressure  radiating to his armpit and down his left arm associated with shortness of breath, relieved with 5 baby aspirin he took prior to arrival.  Similar in character to his prior angina.  Troponins elevated with current peak of 1000, EKGs with new T wave inversions in lead II and lead III without progression on serial EKGs.  NSTEMI ruled in, Dr. Darrold Junker is recommending transfer to [facility] for complex intervention due to the patient's known anomalous right coronary artery anatomy.  The patient and his wife are agreeable to this. -S/p 5 baby aspirin at home, continue 81 mg aspirin daily -Continue heparin infusion -Please keep n.p.o. except for sips with meds until transfer to [facility] -Continue home medications including atenolol 50 mg daily, felodipine 10 mg daily, lisinopril 20-HCTZ 12.5 -Continue Crestor 20, fenofibrate -Consider addition of PCSK9 inhibitor on an outpatient basis with severe hypertriglyceridemia -Echo complete -Transfer to [facility] arranged for consideration of LHC and intervention.  Accepting physician Dr. Chales Abrahams.  -Cardiac rehab at discharge  This patient's plan of care was discussed and created with Dr. Darrold Junker and he is in agreement.  Signed: Rebeca Allegra , PA-C 02/18/2023, 8:15 AM [facility]

## 2023-02-18 NOTE — ED Notes (Signed)
Pt bed assignment [facility] Room 3213. [phone]

## 2023-02-18 NOTE — ED Notes (Signed)
Transfer consent signed at this time

## 2023-02-22 LAB — LIPOPROTEIN A (LPA): Lipoprotein (a): 28 nmol/L (ref <75.0)

## 2023-04-05 ENCOUNTER — Other Ambulatory Visit: Payer: Self-pay

## 2023-04-05 DIAGNOSIS — C61 Malignant neoplasm of prostate: Secondary | ICD-10-CM

## 2023-04-06 ENCOUNTER — Inpatient Hospital Stay: Payer: Medicare Other | Attending: Oncology

## 2023-04-06 DIAGNOSIS — Z5111 Encounter for antineoplastic chemotherapy: Secondary | ICD-10-CM | POA: Diagnosis present

## 2023-04-06 DIAGNOSIS — C61 Malignant neoplasm of prostate: Secondary | ICD-10-CM | POA: Diagnosis present

## 2023-04-06 DIAGNOSIS — Z79899 Other long term (current) drug therapy: Secondary | ICD-10-CM | POA: Insufficient documentation

## 2023-04-06 LAB — CBC WITH DIFFERENTIAL (CANCER CENTER ONLY)
Abs Immature Granulocytes: 0.02 10*3/uL (ref 0.00–0.07)
Basophils Absolute: 0 10*3/uL (ref 0.0–0.1)
Basophils Relative: 1 %
Eosinophils Absolute: 0.4 10*3/uL (ref 0.0–0.5)
Eosinophils Relative: 6 %
HCT: 36.9 % — ABNORMAL LOW (ref 39.0–52.0)
Hemoglobin: 12.6 g/dL — ABNORMAL LOW (ref 13.0–17.0)
Immature Granulocytes: 0 %
Lymphocytes Relative: 38 %
Lymphs Abs: 2.2 10*3/uL (ref 0.7–4.0)
MCH: 32 pg (ref 26.0–34.0)
MCHC: 34.1 g/dL (ref 30.0–36.0)
MCV: 93.7 fL (ref 80.0–100.0)
Monocytes Absolute: 0.5 10*3/uL (ref 0.1–1.0)
Monocytes Relative: 9 %
Neutro Abs: 2.6 10*3/uL (ref 1.7–7.7)
Neutrophils Relative %: 46 %
Platelet Count: 166 10*3/uL (ref 150–400)
RBC: 3.94 MIL/uL — ABNORMAL LOW (ref 4.22–5.81)
RDW: 13.6 % (ref 11.5–15.5)
WBC Count: 5.7 10*3/uL (ref 4.0–10.5)
nRBC: 0 % (ref 0.0–0.2)

## 2023-04-06 LAB — BASIC METABOLIC PANEL - CANCER CENTER ONLY
Anion gap: 9 (ref 5–15)
BUN: 16 mg/dL (ref 8–23)
CO2: 23 mmol/L (ref 22–32)
Calcium: 9.4 mg/dL (ref 8.9–10.3)
Chloride: 105 mmol/L (ref 98–111)
Creatinine: 1.1 mg/dL (ref 0.61–1.24)
GFR, Estimated: >60 mL/min (ref >60)
Glucose, Bld: 134 mg/dL — ABNORMAL HIGH (ref 70–99)
Potassium: 3.6 mmol/L (ref 3.5–5.1)
Sodium: 137 mmol/L (ref 135–145)

## 2023-04-06 LAB — PSA: Prostatic Specific Antigen: 0.23 ng/mL (ref 0.00–4.00)

## 2023-04-07 ENCOUNTER — Inpatient Hospital Stay: Payer: Medicare Other

## 2023-04-07 ENCOUNTER — Encounter: Payer: Self-pay | Admitting: Oncology

## 2023-04-07 ENCOUNTER — Inpatient Hospital Stay (HOSPITAL_BASED_OUTPATIENT_CLINIC_OR_DEPARTMENT_OTHER): Payer: Medicare Other | Admitting: Oncology

## 2023-04-07 VITALS — BP 119/75 | HR 61 | Temp 96.2°F | Resp 17 | Wt 209.0 lb

## 2023-04-07 DIAGNOSIS — Z5111 Encounter for antineoplastic chemotherapy: Secondary | ICD-10-CM | POA: Diagnosis not present

## 2023-04-07 DIAGNOSIS — C61 Malignant neoplasm of prostate: Secondary | ICD-10-CM

## 2023-04-07 MED ORDER — LEUPROLIDE ACETATE (6 MONTH) 45 MG ~~LOC~~ KIT
45.0000 mg | PACK | Freq: Once | SUBCUTANEOUS | Status: AC
  Administered 2023-04-07: 45 mg via SUBCUTANEOUS
  Filled 2023-04-07: qty 45

## 2023-04-07 NOTE — Progress Notes (Signed)
[facility]  [phone] [phone]  ID: Cody Sox Sr. OB: 1951-06-03  MR#: [id number]  QIO#:[id number]  Patient Care Team: Danella Penton, MD as PCP - General (Internal Medicine) Jeralyn Ruths, MD as Consulting Physician (Oncology)  CHIEF COMPLAINT: Stage IIIb prostate cancer  INTERVAL HISTORY: Patient returns to clinic today for repeat laboratory work, routine 52-month evaluation, and continuation of Xtandi and Eligard.  He was admitted to the hospital with NSTEMI approximately 6 weeks ago requiring stent placement.  He currently feels well and is nearly back to his baseline.  He does not complain of any weakness or fatigue.  He is tolerating his treatments without significant side effects.  He denies any pain. He has no neurologic complaints.  He denies any recent fevers or illnesses.  He has a good appetite and denies weight loss.  He denies any chest pain, shortness of breath, cough, or hemoptysis.  He denies any nausea, vomiting, constipation, or diarrhea.  He has no urinary complaints.  Patient offers no specific complaints today.  REVIEW OF SYSTEMS:   Review of Systems  Constitutional: Negative.  Negative for fever, malaise/fatigue and weight loss.  Respiratory: Negative.  Negative for cough, hemoptysis and shortness of breath.   Cardiovascular: Negative.  Negative for chest pain and leg swelling.  Gastrointestinal: Negative.  Negative for abdominal pain.  Genitourinary: Negative.  Negative for dysuria and hematuria.  Musculoskeletal: Negative.  Negative for back pain.  Skin: Negative.  Negative for rash.  Neurological: Negative.  Negative for dizziness, sensory change, focal weakness, weakness and headaches.  Psychiatric/Behavioral: Negative.  The patient is not nervous/anxious.     As per HPI. Otherwise, a complete review of systems is negative.  PAST MEDICAL HISTORY: Past Medical History:  Diagnosis Date   Arthritis     lower back    Cancer [facility])    prostate cancer    Coronary artery disease    stent RCA- 2000 and 12/2009    GERD (gastroesophageal reflux disease)    Hyperlipidemia    Hypertension    MI (myocardial infarction) (HCC) 2024   Skin cancer     PAST SURGICAL HISTORY: Past Surgical History:  Procedure Laterality Date   APPENDECTOMY     COLONOSCOPY WITH PROPOFOL N/A 07/18/2017   Procedure: COLONOSCOPY WITH PROPOFOL;  Surgeon: Christena Deem, MD;  Location: [facility] ENDOSCOPY;  Service: Endoscopy;  Laterality: N/A;   CORONARY ANGIOPLASTY     coronary stents      x 2   KNEE ARTHROPLASTY Left 07/20/2017   Procedure: COMPUTER ASSISTED TOTAL KNEE ARTHROPLASTY;  Surgeon: Donato Heinz, MD;  Location: [facility] ORS;  Service: Orthopedics;  Laterality: Left;   KNEE ARTHROPLASTY Right 01/23/2020   Procedure: COMPUTER ASSISTED TOTAL KNEE ARTHROPLASTY;  Surgeon: Donato Heinz, MD;  Location: [facility] ORS;  Service: Orthopedics;  Laterality: Right;   ROBOT ASSISTED LAPAROSCOPIC RADICAL PROSTATECTOMY N/A 07/16/2013   Procedure: ROBOTIC ASSISTED LAPAROSCOPIC RADICAL PROSTATECTOMY LEVEL 2, bilateral pelvic lymphadenectomy;  Surgeon: Crecencio Mc, MD;  Location: [facility] ORS;  Service: Urology;  Laterality: N/A;   TONSILLECTOMY     and adenoidectomy     FAMILY HISTORY: Family History  Problem Relation Age of Onset   Cancer Mother 80       cervical cancer   Heart attack Father    Thyroid cancer Daughter 64       doing well    ADVANCED DIRECTIVES (Y/N):  N  HEALTH MAINTENANCE: Social History   Tobacco  Use   Smoking status: Former    Packs/day: 1.00    Years: 30.00    Additional pack years: 0.00    Total pack years: 30.00    Types: Cigarettes    Quit date: 11/01/1992    Years since quitting: 30.4   Smokeless tobacco: Never  Vaping Use   Vaping Use: Never used  Substance Use Topics   Alcohol use: No   Drug use: No     Colonoscopy:  PAP:  Bone density:  Lipid panel:  Allergies  Allergen Reactions    Ampicillin Other (See Comments)    Raw mouth and throat    Atorvastatin     Other reaction(s): Unknown   Carvedilol     Other reaction(s): Dizziness   Contrast Media [Iodinated Contrast Media] Hives   Diltiazem Hcl Other (See Comments)   Fluvastatin Other (See Comments)   Lovastatin Other (See Comments)   Sulfa Antibiotics Hives   Betadine [Povidone Iodine] Rash   Celecoxib Palpitations    Current Outpatient Medications  Medication Sig Dispense Refill   acetaminophen (TYLENOL) 500 MG tablet Take 1,000 mg by mouth 2 (two) times daily as needed.      aspirin EC 81 MG tablet Take 81 mg by mouth daily. Swallow whole.     atenolol (TENORMIN) 100 MG tablet Take 50 mg by mouth every morning.     b complex vitamins capsule Take 1 capsule by mouth daily.     Calcium Carbonate-Vit D-Min (CALTRATE 600+D PLUS PO) Take 1 tablet by mouth daily.      Coenzyme Q10 (COQ10) 100 MG CAPS Take 100 mg by mouth at bedtime.      enzalutamide (XTANDI) 80 MG tablet Take 2 tablets (160 mg total) by mouth daily. 60 tablet 5   felodipine (PLENDIL) 10 MG 24 hr tablet Take 10 mg by mouth at bedtime.      fluticasone (FLONASE) 50 MCG/ACT nasal spray SPRAY 2 SPRAYS INTO EACH NOSTRIL EVERY DAY     folic acid (FOLVITE) 800 MCG tablet Take 800 mcg by mouth at bedtime.      heparin 40981 UT/250ML infusion Inject 1,200 Units/hr into the vein continuous.     Inositol Niacinate (NIACIN FLUSH FREE) 500 MG CAPS Take 1,000 mg by mouth in the morning and at bedtime.     Leuprolide Acetate, 6 Month, (LUPRON DEPOT, 29-MONTH,) 45 MG injection Inject 45 mg into the muscle every 6 (six) months.     lisinopril-hydrochlorothiazide (PRINZIDE,ZESTORETIC) 20-12.5 MG per tablet Take 1 tablet by mouth every morning.     nitroGLYCERIN (NITROSTAT) 0.4 MG SL tablet Place 1 tablet (0.4 mg total) under the tongue every 5 (five) minutes x 3 doses as needed for chest pain.  12   NON FORMULARY MINERAL OIL--USE FOR CONSTIPATION.     omeprazole  (PRILOSEC) 20 MG capsule Take 20 mg by mouth daily.     prasugrel (EFFIENT) 10 MG TABS tablet Take 10 mg by mouth daily.     rosuvastatin (CRESTOR) 10 MG tablet Take 20 mg by mouth daily.     triamcinolone (NASACORT) 55 MCG/ACT AERO nasal inhaler Place 2 sprays into the nose daily.     vitamin B-12 (CYANOCOBALAMIN) 1000 MCG tablet Take 1,000 mcg by mouth daily.     vitamin E 400 UNIT capsule Take 400 Units by mouth daily.     No current facility-administered medications for this visit.   Facility-Administered Medications Ordered in Other Visits  Medication Dose Route Frequency Provider Last  Rate Last Admin   Leuprolide Acetate (6 Month) (LUPRON) injection 45 mg  45 mg Intramuscular Once Jeralyn Ruths, MD        OBJECTIVE: Vitals:   04/07/23 1122 04/07/23 1125  BP: (!) 170/82 119/75  Pulse: 61   Resp: 17   Temp: (!) 96.2 F (35.7 C)   SpO2: 98%      Body mass index is 30.86 kg/m.    ECOG FS:0 - Asymptomatic  General: Well-developed, well-nourished, no acute distress. Eyes: Pink conjunctiva, anicteric sclera. HEENT: Normocephalic, moist mucous membranes. Lungs: No audible wheezing or coughing. Heart: Regular rate and rhythm. Abdomen: Soft, nontender, no obvious distention. Musculoskeletal: No edema, cyanosis, or clubbing. Neuro: Alert, answering all questions appropriately. Cranial nerves grossly intact. Skin: No rashes or petechiae noted. Psych: Normal affect.  LAB RESULTS:  Lab Results  Component Value Date   NA 137 04/06/2023   K 3.6 04/06/2023   CL 105 04/06/2023   CO2 23 04/06/2023   GLUCOSE 134 (H) 04/06/2023   BUN 16 04/06/2023   CREATININE 1.10 04/06/2023   CALCIUM 9.4 04/06/2023   PROT 6.7 01/15/2020   ALBUMIN 4.1 01/15/2020   AST 23 01/15/2020   ALT 32 01/15/2020   ALKPHOS 71 01/15/2020   BILITOT 1.3 (H) 01/15/2020   GFRNONAA >60 04/06/2023   GFRAA >60 01/15/2020    Lab Results  Component Value Date   WBC 5.7 04/06/2023   NEUTROABS 2.6  04/06/2023   HGB 12.6 (L) 04/06/2023   HCT 36.9 (L) 04/06/2023   MCV 93.7 04/06/2023   PLT 166 04/06/2023     STUDIES: No results found.   ONCOLOGY TREATMENT HISTORY: Patient underwent radical prostatectomy on July 06, 2013.  Final pathology reported Gleason 7 (4+3), extracapsular extension, but margins were clear.  Seminal vesicles were not involved and 0 of 8 lymph nodes were negative for disease.  Patient noted to have a rising PSA and underwent salvage XRT in Spring of 2017.  Nuclear med bone scan on May 30, 2017 revealed improvement of uptake in the right posterior inferior pubic ramus and distal left femoral metadiaphysis.  PET scan on October 31, 2018 revealed no evidence of disease.  Patient reinitiated Lupron/Eligard on November 02, 2018.  He subsequently initiated Xtandi in June 2022.   ASSESSMENT: Stage IIIb prostate cancer  PLAN:    Stage IIIb prostate cancer: Previously, patient's PSA tripled over a 20-month timeframe from from 21.43-65.34.  Despite this, F18-PYLARIFY PET scan on Mar 11, 2021 revealed no evidence of disease.  He was subsequently initiated Xtandi in June 2022.  Patient's PSA trended down to a nadir of 0.06 on October 04, 2022.  It has now trended up slightly to 0.23.  Continue to monitor closely.  Continue Xtandi indefinitely or until progression of disease.  Proceed with Eligard today.  Return to clinic in 3 months for laboratory work and evaluation by clinical pharmacy.  Patient will then return to clinic in 6 months for laboratory work, MD evaluation, and continuation of treatment.  Cardiac disease: Continue follow-up with cardiology as scheduled. Hypertension: Patient's blood pressure is moderately elevated today.  Continue follow-up and management per primary care/cardiology.    Patient expressed understanding and was in agreement with this plan. He also understands that He can call clinic at any time with any questions, concerns, or complaints.     Cancer Staging  Prostate cancer [facility]) Staging form: Prostate, AJCC 8th Edition - Clinical stage from 07/08/2018: Stage IIIB (cT3a, cN0, cM0, Grade  Group: 3) - Signed by Jeralyn Ruths, MD on 07/08/2018 Gleason score: 7 Histologic grading system: 5 grade system   Jeralyn Ruths, MD   04/07/2023 1:28 PM

## 2023-04-08 LAB — TESTOSTERONE: Testosterone: 11 ng/dL — ABNORMAL LOW (ref 264–916)

## 2023-07-06 ENCOUNTER — Other Ambulatory Visit: Payer: PRIVATE HEALTH INSURANCE

## 2023-07-07 ENCOUNTER — Ambulatory Visit: Payer: PRIVATE HEALTH INSURANCE | Admitting: Pharmacist

## 2023-07-08 ENCOUNTER — Other Ambulatory Visit: Payer: PRIVATE HEALTH INSURANCE

## 2023-07-12 ENCOUNTER — Other Ambulatory Visit: Payer: Self-pay | Admitting: *Deleted

## 2023-07-12 DIAGNOSIS — C61 Malignant neoplasm of prostate: Secondary | ICD-10-CM

## 2023-07-13 ENCOUNTER — Inpatient Hospital Stay: Payer: Medicare Other | Attending: Oncology

## 2023-07-13 DIAGNOSIS — C61 Malignant neoplasm of prostate: Secondary | ICD-10-CM | POA: Diagnosis present

## 2023-07-13 LAB — CBC WITH DIFFERENTIAL/PLATELET
Abs Immature Granulocytes: 0.05 10*3/uL (ref 0.00–0.07)
Basophils Absolute: 0 10*3/uL (ref 0.0–0.1)
Basophils Relative: 0 %
Eosinophils Absolute: 0.5 10*3/uL (ref 0.0–0.5)
Eosinophils Relative: 8 %
HCT: 38.3 % — ABNORMAL LOW (ref 39.0–52.0)
Hemoglobin: 13.1 g/dL (ref 13.0–17.0)
Immature Granulocytes: 1 %
Lymphocytes Relative: 40 %
Lymphs Abs: 2.5 10*3/uL (ref 0.7–4.0)
MCH: 31.5 pg (ref 26.0–34.0)
MCHC: 34.2 g/dL (ref 30.0–36.0)
MCV: 92.1 fL (ref 80.0–100.0)
Monocytes Absolute: 0.7 10*3/uL (ref 0.1–1.0)
Monocytes Relative: 11 %
Neutro Abs: 2.4 10*3/uL (ref 1.7–7.7)
Neutrophils Relative %: 40 %
Platelets: 168 10*3/uL (ref 150–400)
RBC: 4.16 MIL/uL — ABNORMAL LOW (ref 4.22–5.81)
RDW: 13.4 % (ref 11.5–15.5)
WBC: 6.1 10*3/uL (ref 4.0–10.5)
nRBC: 0 % (ref 0.0–0.2)

## 2023-07-13 LAB — BASIC METABOLIC PANEL - CANCER CENTER ONLY
Anion gap: 7 (ref 5–15)
BUN: 14 mg/dL (ref 8–23)
CO2: 24 mmol/L (ref 22–32)
Calcium: 9.2 mg/dL (ref 8.9–10.3)
Chloride: 103 mmol/L (ref 98–111)
Creatinine: 0.96 mg/dL (ref 0.61–1.24)
GFR, Estimated: >60 mL/min (ref >60)
Glucose, Bld: 121 mg/dL — ABNORMAL HIGH (ref 70–99)
Potassium: 3.7 mmol/L (ref 3.5–5.1)
Sodium: 134 mmol/L — ABNORMAL LOW (ref 135–145)

## 2023-07-13 LAB — PSA: Prostatic Specific Antigen: 1.04 ng/mL (ref 0.00–4.00)

## 2023-07-14 ENCOUNTER — Encounter: Payer: Self-pay | Admitting: Pharmacist

## 2023-07-14 ENCOUNTER — Inpatient Hospital Stay: Payer: Medicare Other | Admitting: Pharmacist

## 2023-07-14 VITALS — BP 128/68 | HR 63 | Temp 96.2°F | Wt 210.0 lb

## 2023-07-14 DIAGNOSIS — C61 Malignant neoplasm of prostate: Secondary | ICD-10-CM

## 2023-07-14 LAB — TESTOSTERONE: Testosterone: 14 ng/dL — ABNORMAL LOW (ref 264–916)

## 2023-07-14 MED ORDER — ENZALUTAMIDE 80 MG PO TABS: 160.0000 mg | ORAL_TABLET | Freq: Every day | ORAL | 5 refills | Status: DC

## 2023-07-14 NOTE — Progress Notes (Signed)
Oral Chemotherapy Clinic [facility]  Telephone:([phone] [phone]  Patient Care Team: Danella Penton, MD as PCP - General (Internal Medicine) Jeralyn Ruths, MD as Consulting Physician (Oncology)   Name of the patient: Cody Ellison  [id number]  10-Jan-1951   Date of visit: 07/14/23  HPI: Patient is a 72 y.o. male with non-metastatic castration resistant prostate cancer. Patient started his Diana Eves (enzalutamide) on 04/07/21. Plan to continue Xtandi until disease progression or intolerable side effects.   Reason for Consult: Oral chemotherapy follow-up for Xtandi (enzalutamide) therapy.   PAST MEDICAL HISTORY: Past Medical History:  Diagnosis Date   Arthritis    lower back    Cancer [facility])    prostate cancer    Coronary artery disease    stent RCA- 2000 and 12/2009    GERD (gastroesophageal reflux disease)    Hyperlipidemia    Hypertension    MI (myocardial infarction) (HCC) 2024   Skin cancer     HEMATOLOGY/ONCOLOGY HISTORY:  Oncology History  Prostate cancer (HCC)  07/03/2018 Initial Diagnosis   Prostate cancer (HCC)   07/08/2018 Cancer Staging   Staging form: Prostate, AJCC 8th Edition - Clinical stage from 07/08/2018: Stage IIIB (cT3a, cN0, cM0, Grade Group: 3) - Signed by Jeralyn Ruths, MD on 07/08/2018     ALLERGIES:  is allergic to ampicillin, atorvastatin, carvedilol, contrast media [iodinated contrast media], diltiazem hcl, fluvastatin, lovastatin, sulfa antibiotics, betadine [povidone iodine], and celecoxib.  MEDICATIONS:  Current Outpatient Medications  Medication Sig Dispense Refill   acetaminophen (TYLENOL) 500 MG tablet Take 1,000 mg by mouth 2 (two) times daily as needed.      aspirin EC 81 MG tablet Take 81 mg by mouth daily. Swallow whole.     atenolol (TENORMIN) 100 MG tablet Take 50 mg by mouth every morning.     b complex vitamins capsule Take 1 capsule by mouth daily.     Calcium Carbonate-Vit D-Min (CALTRATE  600+D PLUS PO) Take 1 tablet by mouth daily.      Coenzyme Q10 (COQ10) 100 MG CAPS Take 100 mg by mouth at bedtime.      enzalutamide (XTANDI) 80 MG tablet Take 2 tablets (160 mg total) by mouth daily. 60 tablet 5   felodipine (PLENDIL) 10 MG 24 hr tablet Take 10 mg by mouth at bedtime.      fluticasone (FLONASE) 50 MCG/ACT nasal spray SPRAY 2 SPRAYS INTO EACH NOSTRIL EVERY DAY     folic acid (FOLVITE) 800 MCG tablet Take 800 mcg by mouth at bedtime.      heparin 56387 UT/250ML infusion Inject 1,200 Units/hr into the vein continuous.     Inositol Niacinate (NIACIN FLUSH FREE) 500 MG CAPS Take 1,000 mg by mouth in the morning and at bedtime.     Leuprolide Acetate, 6 Month, (LUPRON DEPOT, 29-MONTH,) 45 MG injection Inject 45 mg into the muscle every 6 (six) months.     lisinopril-hydrochlorothiazide (PRINZIDE,ZESTORETIC) 20-12.5 MG per tablet Take 1 tablet by mouth every morning.     nitroGLYCERIN (NITROSTAT) 0.4 MG SL tablet Place 1 tablet (0.4 mg total) under the tongue every 5 (five) minutes x 3 doses as needed for chest pain.  12   NON FORMULARY MINERAL OIL--USE FOR CONSTIPATION.     omeprazole (PRILOSEC) 20 MG capsule Take 20 mg by mouth daily.     prasugrel (EFFIENT) 10 MG TABS tablet Take 10 mg by mouth daily.     rosuvastatin (CRESTOR) 10 MG tablet Take  20 mg by mouth daily.     triamcinolone (NASACORT) 55 MCG/ACT AERO nasal inhaler Place 2 sprays into the nose daily.     vitamin B-12 (CYANOCOBALAMIN) 1000 MCG tablet Take 1,000 mcg by mouth daily.     vitamin E 400 UNIT capsule Take 400 Units by mouth daily.     No current facility-administered medications for this visit.   Facility-Administered Medications Ordered in Other Visits  Medication Dose Route Frequency Provider Last Rate Last Admin   Leuprolide Acetate (6 Month) (LUPRON) injection 45 mg  45 mg Intramuscular Once Jeralyn Ruths, MD        VITAL SIGNS: BP 128/68 (BP Location: Left Arm, Patient Position: Sitting)    Pulse 63   Temp (!) 96.2 F (35.7 C) (Tympanic)   Wt 95.3 kg (210 lb)   BMI 31.01 kg/m  Filed Weights   07/14/23 1043  Weight: 95.3 kg (210 lb)     Estimated body mass index is 31.01 kg/m as calculated from the following:   Height as of 02/18/23: 5\' 9"  (1.753 m).   Weight as of this encounter: 95.3 kg (210 lb).  LABS: CBC:    Component Value Date/Time   WBC 6.1 07/13/2023 1030   HGB 13.1 07/13/2023 1030   HGB 12.6 (L) 04/06/2023 1018   HCT 38.3 (L) 07/13/2023 1030   PLT 168 07/13/2023 1030   PLT 166 04/06/2023 1018   MCV 92.1 07/13/2023 1030   NEUTROABS 2.4 07/13/2023 1030   LYMPHSABS 2.5 07/13/2023 1030   MONOABS 0.7 07/13/2023 1030   EOSABS 0.5 07/13/2023 1030   BASOSABS 0.0 07/13/2023 1030   Comprehensive Metabolic Panel:    Component Value Date/Time   NA 134 (L) 07/13/2023 1031   K 3.7 07/13/2023 1031   CL 103 07/13/2023 1031   CO2 24 07/13/2023 1031   BUN 14 07/13/2023 1031   CREATININE 0.96 07/13/2023 1031   GLUCOSE 121 (H) 07/13/2023 1031   CALCIUM 9.2 07/13/2023 1031   AST 23 01/15/2020 1317   ALT 32 01/15/2020 1317   ALKPHOS 71 01/15/2020 1317   BILITOT 1.3 (H) 01/15/2020 1317   PROT 6.7 01/15/2020 1317   ALBUMIN 4.1 01/15/2020 1317     Present during today's visit: Patient only  Assessment and Plan: Reviewed CBC/BMP; PSA increased from 0.23 (04/06/23) to 1.04 (07/24/23); all other labs appropriate, continue enzalutamide 160mg  daily Plan to repeat PSA in 6 weeks on 08/25/23, and if no changes next follow-up with MD on 10/06/23 as scheduled.  If PSA continues to trend up in 6 weeks could consider ordering imaging studies   Oral Chemotherapy Side Effect/Intolerance:  No reported fatigue, headache/dizziness, or edema   Oral Chemotherapy Adherence: no missed doses reported No patient barriers to medication adherence identified.   New medications: none reported  Medication Access Issues: Patient fills using manufacturer assistance, refill  prescription sent to [facility]  Patient expressed understanding and was in agreement with this plan. He also understands that he can call clinic at any time with any questions, concerns, or complaints.   Follow-up plan: RTC in 6 weeks lab and 3 months lab/MD  Thank you for allowing me to participate in the care of this very pleasant patient.   Time Total: 15 minutes  Visit consisted of counseling and education on dealing with issues of symptom management in the setting of serious and potentially life-threatening illness.Greater than 50%  of this time was spent counseling and coordinating care related to the above assessment and  plan.   Remi Haggard, PharmD, BCPS, BCOP, CPP Hematology/Oncology Clinical Pharmacist Practitioner [place]/DB/AP Oral Chemotherapy Navigation Clinic [phone]  07/14/2023 11:09 AM

## 2023-08-17 ENCOUNTER — Other Ambulatory Visit: Payer: Self-pay | Admitting: *Deleted

## 2023-08-17 NOTE — Telephone Encounter (Signed)
Yes the Xtandi of note Nadara Mode is on PAL this week

## 2023-08-17 NOTE — Telephone Encounter (Signed)
Wife called stating that pharmacy called regarding the fact that they do not have any 80 mg tablets and need a new prescription for 40 mg tablets sent to them so he can get his medicine. Please send new prescription

## 2023-08-18 ENCOUNTER — Encounter: Payer: Self-pay | Admitting: Oncology

## 2023-08-18 MED ORDER — ENZALUTAMIDE 40 MG PO TABS: 160.0000 mg | ORAL_TABLET | Freq: Every day | ORAL | 0 refills | Status: DC

## 2023-08-25 ENCOUNTER — Inpatient Hospital Stay: Payer: Medicare Other | Attending: Oncology

## 2023-08-25 DIAGNOSIS — C61 Malignant neoplasm of prostate: Secondary | ICD-10-CM | POA: Diagnosis present

## 2023-08-25 LAB — PSA: Prostatic Specific Antigen: 1.19 ng/mL (ref 0.00–4.00)

## 2023-09-14 ENCOUNTER — Other Ambulatory Visit: Payer: Self-pay | Admitting: Oncology

## 2023-09-15 ENCOUNTER — Encounter: Payer: Self-pay | Admitting: Oncology

## 2023-09-27 ENCOUNTER — Telehealth: Payer: Self-pay

## 2023-09-27 NOTE — Telephone Encounter (Signed)
Oral Oncology Patient Advocate Encounter  Was successful in securing patient a $8,000.00 grant from [employer] to provide copayment coverage for [place].  This will keep the out of pocket expense at $0.     [employer] ID: [id number]   The billing information is as follows and has been shared with [facility].    RxBin: [id number] PCN: [id number] Member ID: [id number] Group ID: [id number] Dates of Eligibility: 08/28/23 through 08/26/24  Fund:  Prostate Cancer - Medicare Access   Ardeen Fillers, CPhT Oncology Pharmacy Patient Advocate  [name]  [phone] (phone) [phone] (fax) 09/27/2023 1:33 PM

## 2023-10-05 ENCOUNTER — Other Ambulatory Visit: Payer: Self-pay

## 2023-10-05 DIAGNOSIS — C61 Malignant neoplasm of prostate: Secondary | ICD-10-CM

## 2023-10-06 ENCOUNTER — Inpatient Hospital Stay: Payer: Medicare Other | Attending: Oncology

## 2023-10-06 DIAGNOSIS — Z79899 Other long term (current) drug therapy: Secondary | ICD-10-CM | POA: Diagnosis not present

## 2023-10-06 DIAGNOSIS — Z5111 Encounter for antineoplastic chemotherapy: Secondary | ICD-10-CM | POA: Diagnosis present

## 2023-10-06 DIAGNOSIS — C61 Malignant neoplasm of prostate: Secondary | ICD-10-CM | POA: Insufficient documentation

## 2023-10-06 LAB — CBC WITH DIFFERENTIAL (CANCER CENTER ONLY)
Abs Immature Granulocytes: 0.02 10*3/uL (ref 0.00–0.07)
Basophils Absolute: 0 10*3/uL (ref 0.0–0.1)
Basophils Relative: 1 %
Eosinophils Absolute: 0.3 10*3/uL (ref 0.0–0.5)
Eosinophils Relative: 4 %
HCT: 42.4 % (ref 39.0–52.0)
Hemoglobin: 14.1 g/dL (ref 13.0–17.0)
Immature Granulocytes: 0 %
Lymphocytes Relative: 42 %
Lymphs Abs: 3 10*3/uL (ref 0.7–4.0)
MCH: 31.4 pg (ref 26.0–34.0)
MCHC: 33.3 g/dL (ref 30.0–36.0)
MCV: 94.4 fL (ref 80.0–100.0)
Monocytes Absolute: 0.6 10*3/uL (ref 0.1–1.0)
Monocytes Relative: 9 %
Neutro Abs: 3 10*3/uL (ref 1.7–7.7)
Neutrophils Relative %: 44 %
Platelet Count: 181 10*3/uL (ref 150–400)
RBC: 4.49 MIL/uL (ref 4.22–5.81)
RDW: 13.2 % (ref 11.5–15.5)
WBC Count: 7 10*3/uL (ref 4.0–10.5)
nRBC: 0 % (ref 0.0–0.2)

## 2023-10-06 LAB — CMP (CANCER CENTER ONLY)
ALT: 31 U/L (ref 0–44)
AST: 34 U/L (ref 15–41)
Albumin: 4.5 g/dL (ref 3.5–5.0)
Alkaline Phosphatase: 67 U/L (ref 38–126)
Anion gap: 8 (ref 5–15)
BUN: 17 mg/dL (ref 8–23)
CO2: 26 mmol/L (ref 22–32)
Calcium: 10 mg/dL (ref 8.9–10.3)
Chloride: 105 mmol/L (ref 98–111)
Creatinine: 1.17 mg/dL (ref 0.61–1.24)
GFR, Estimated: >60 mL/min (ref >60)
Glucose, Bld: 128 mg/dL — ABNORMAL HIGH (ref 70–99)
Potassium: 4.4 mmol/L (ref 3.5–5.1)
Sodium: 139 mmol/L (ref 135–145)
Total Bilirubin: 1.8 mg/dL — ABNORMAL HIGH (ref <1.2)
Total Protein: 7.2 g/dL (ref 6.5–8.1)

## 2023-10-06 LAB — PSA: Prostatic Specific Antigen: 1.57 ng/mL (ref 0.00–4.00)

## 2023-10-07 ENCOUNTER — Inpatient Hospital Stay (HOSPITAL_BASED_OUTPATIENT_CLINIC_OR_DEPARTMENT_OTHER): Payer: Medicare Other | Admitting: Oncology

## 2023-10-07 ENCOUNTER — Encounter: Payer: Self-pay | Admitting: Oncology

## 2023-10-07 ENCOUNTER — Inpatient Hospital Stay: Payer: Medicare Other

## 2023-10-07 VITALS — BP 114/73 | HR 60 | Temp 96.5°F | Resp 16 | Ht 69.0 in | Wt 208.3 lb

## 2023-10-07 DIAGNOSIS — C61 Malignant neoplasm of prostate: Secondary | ICD-10-CM | POA: Diagnosis not present

## 2023-10-07 DIAGNOSIS — Z5111 Encounter for antineoplastic chemotherapy: Secondary | ICD-10-CM | POA: Diagnosis not present

## 2023-10-07 MED ORDER — LEUPROLIDE ACETATE (6 MONTH) 45 MG ~~LOC~~ KIT
45.0000 mg | PACK | Freq: Once | SUBCUTANEOUS | Status: AC
  Administered 2023-10-07: 45 mg via SUBCUTANEOUS
  Filled 2023-10-07: qty 45

## 2023-10-07 NOTE — Progress Notes (Signed)
[facility]  [phone] [phone]  ID: Cody Sox Sr. OB: 1951-04-13  MR#: [id number]  AOZ#:[id number]  Patient Care Team: Danella Penton, MD as PCP - General (Internal Medicine) Jeralyn Ruths, MD as Consulting Physician (Oncology)  CHIEF COMPLAINT: Stage IIIb prostate cancer  INTERVAL HISTORY: Patient returns to clinic today for repeat laboratory work, further evaluation, and continuation of Xtandi and Eligard.  He currently feels well and is asymptomatic.  He does not complain of any weakness or fatigue.  He continues to tolerate his treatments without significant side effects.  He denies any pain. He has no neurologic complaints.  He denies any recent fevers or illnesses.  He has a good appetite and denies weight loss.  He denies any chest pain, shortness of breath, cough, or hemoptysis.  He denies any nausea, vomiting, constipation, or diarrhea.  He has no urinary complaints.  Patient offers no specific complaints today.  REVIEW OF SYSTEMS:   Review of Systems  Constitutional: Negative.  Negative for fever, malaise/fatigue and weight loss.  Respiratory: Negative.  Negative for cough, hemoptysis and shortness of breath.   Cardiovascular: Negative.  Negative for chest pain and leg swelling.  Gastrointestinal: Negative.  Negative for abdominal pain.  Genitourinary: Negative.  Negative for dysuria and hematuria.  Musculoskeletal: Negative.  Negative for back pain.  Skin: Negative.  Negative for rash.  Neurological: Negative.  Negative for dizziness, sensory change, focal weakness, weakness and headaches.  Psychiatric/Behavioral: Negative.  The patient is not nervous/anxious.     As per HPI. Otherwise, a complete review of systems is negative.  PAST MEDICAL HISTORY: Past Medical History:  Diagnosis Date   Arthritis    lower back    Cancer [facility])    prostate cancer    Coronary artery disease    stent RCA- 2000 and 12/2009    GERD  (gastroesophageal reflux disease)    Hyperlipidemia    Hypertension    MI (myocardial infarction) (HCC) 2024   Skin cancer     PAST SURGICAL HISTORY: Past Surgical History:  Procedure Laterality Date   APPENDECTOMY     COLONOSCOPY WITH PROPOFOL N/A 07/18/2017   Procedure: COLONOSCOPY WITH PROPOFOL;  Surgeon: Christena Deem, MD;  Location: [facility] ENDOSCOPY;  Service: Endoscopy;  Laterality: N/A;   CORONARY ANGIOPLASTY     coronary stents      x 2   KNEE ARTHROPLASTY Left 07/20/2017   Procedure: COMPUTER ASSISTED TOTAL KNEE ARTHROPLASTY;  Surgeon: Donato Heinz, MD;  Location: [facility] ORS;  Service: Orthopedics;  Laterality: Left;   KNEE ARTHROPLASTY Right 01/23/2020   Procedure: COMPUTER ASSISTED TOTAL KNEE ARTHROPLASTY;  Surgeon: Donato Heinz, MD;  Location: [facility] ORS;  Service: Orthopedics;  Laterality: Right;   ROBOT ASSISTED LAPAROSCOPIC RADICAL PROSTATECTOMY N/A 07/16/2013   Procedure: ROBOTIC ASSISTED LAPAROSCOPIC RADICAL PROSTATECTOMY LEVEL 2, bilateral pelvic lymphadenectomy;  Surgeon: Crecencio Mc, MD;  Location: [facility] ORS;  Service: Urology;  Laterality: N/A;   TONSILLECTOMY     and adenoidectomy     FAMILY HISTORY: Family History  Problem Relation Age of Onset   Cancer Mother 18       cervical cancer   Heart attack Father    Thyroid cancer Daughter 71       doing well    ADVANCED DIRECTIVES (Y/N):  N  HEALTH MAINTENANCE: Social History   Tobacco Use   Smoking status: Former    Current packs/day: 0.00    Average packs/day: 1 pack/day for  30.0 years (30.0 ttl pk-yrs)    Types: Cigarettes    Start date: 11/01/1962    Quit date: 11/01/1992    Years since quitting: 30.9   Smokeless tobacco: Never  Vaping Use   Vaping status: Never Used  Substance Use Topics   Alcohol use: No   Drug use: No     Colonoscopy:  PAP:  Bone density:  Lipid panel:  Allergies  Allergen Reactions   Ampicillin Other (See Comments)    Raw mouth and throat    Atorvastatin     Other  reaction(s): Unknown   Carvedilol     Other reaction(s): Dizziness   Contrast Media [Iodinated Contrast Media] Hives   Diltiazem Hcl Other (See Comments)   Fluvastatin Other (See Comments)   Lovastatin Other (See Comments)   Sulfa Antibiotics Hives   Betadine [Povidone Iodine] Rash   Celecoxib Palpitations    Current Outpatient Medications  Medication Sig Dispense Refill   acetaminophen (TYLENOL) 500 MG tablet Take 1,000 mg by mouth 2 (two) times daily as needed.      aspirin EC 81 MG tablet Take 81 mg by mouth daily. Swallow whole.     atenolol (TENORMIN) 100 MG tablet Take 50 mg by mouth every morning.     b complex vitamins capsule Take 1 capsule by mouth daily.     Calcium Carbonate-Vit D-Min (CALTRATE 600+D PLUS PO) Take 1 tablet by mouth daily.      Coenzyme Q10 (COQ10) 100 MG CAPS Take 100 mg by mouth at bedtime.      felodipine (PLENDIL) 10 MG 24 hr tablet Take 10 mg by mouth at bedtime.      fluticasone (FLONASE) 50 MCG/ACT nasal spray SPRAY 2 SPRAYS INTO EACH NOSTRIL EVERY DAY     folic acid (FOLVITE) 800 MCG tablet Take 800 mcg by mouth at bedtime.      Inositol Niacinate (NIACIN FLUSH FREE) 500 MG CAPS Take 1,000 mg by mouth in the morning and at bedtime.     Leuprolide Acetate, 6 Month, (LUPRON DEPOT, 88-MONTH,) 45 MG injection Inject 45 mg into the muscle every 6 (six) months.     lisinopril-hydrochlorothiazide (PRINZIDE,ZESTORETIC) 20-12.5 MG per tablet Take 1 tablet by mouth every morning.     nitroGLYCERIN (NITROSTAT) 0.4 MG SL tablet Place 1 tablet (0.4 mg total) under the tongue every 5 (five) minutes x 3 doses as needed for chest pain.  12   NON FORMULARY MINERAL OIL--USE FOR CONSTIPATION.     omeprazole (PRILOSEC) 20 MG capsule Take 20 mg by mouth daily.     prasugrel (EFFIENT) 10 MG TABS tablet Take 10 mg by mouth daily.     rosuvastatin (CRESTOR) 10 MG tablet Take 20 mg by mouth daily.     triamcinolone (NASACORT) 55 MCG/ACT AERO nasal inhaler Place 2 sprays into  the nose daily.     vitamin B-12 (CYANOCOBALAMIN) 1000 MCG tablet Take 1,000 mcg by mouth daily.     vitamin E 400 UNIT capsule Take 400 Units by mouth daily.     XTANDI 40 MG tablet Take 4 tablets (160 mg total) by mouth daily. 120 tablet 0   heparin 60454 UT/250ML infusion Inject 1,200 Units/hr into the vein continuous. (Patient not taking: Reported on 10/07/2023)     No current facility-administered medications for this visit.   Facility-Administered Medications Ordered in Other Visits  Medication Dose Route Frequency Provider Last Rate Last Admin   leuprolide (6 Month) (ELIGARD) injection 45 mg  45  mg Subcutaneous Once Jeralyn Ruths, MD       Leuprolide Acetate (6 Month) (LUPRON) injection 45 mg  45 mg Intramuscular Once Jeralyn Ruths, MD        OBJECTIVE: Vitals:   10/07/23 1028  BP: 114/73  Pulse: 60  Resp: 16  Temp: (!) 96.5 F (35.8 C)  SpO2: 100%     Body mass index is 30.76 kg/m.    ECOG FS:0 - Asymptomatic  General: Well-developed, well-nourished, no acute distress. Eyes: Pink conjunctiva, anicteric sclera. HEENT: Normocephalic, moist mucous membranes. Lungs: No audible wheezing or coughing. Heart: Regular rate and rhythm. Abdomen: Soft, nontender, no obvious distention. Musculoskeletal: No edema, cyanosis, or clubbing. Neuro: Alert, answering all questions appropriately. Cranial nerves grossly intact. Skin: No rashes or petechiae noted. Psych: Normal affect.  LAB RESULTS:  Lab Results  Component Value Date   NA 139 10/06/2023   K 4.4 10/06/2023   CL 105 10/06/2023   CO2 26 10/06/2023   GLUCOSE 128 (H) 10/06/2023   BUN 17 10/06/2023   CREATININE 1.17 10/06/2023   CALCIUM 10.0 10/06/2023   PROT 7.2 10/06/2023   ALBUMIN 4.5 10/06/2023   AST 34 10/06/2023   ALT 31 10/06/2023   ALKPHOS 67 10/06/2023   BILITOT 1.8 (H) 10/06/2023   GFRNONAA >60 10/06/2023   GFRAA >60 01/15/2020    Lab Results  Component Value Date   WBC 7.0 10/06/2023    NEUTROABS 3.0 10/06/2023   HGB 14.1 10/06/2023   HCT 42.4 10/06/2023   MCV 94.4 10/06/2023   PLT 181 10/06/2023     STUDIES: No results found.   ONCOLOGY TREATMENT HISTORY: Patient underwent radical prostatectomy on July 06, 2013.  Final pathology reported Gleason 7 (4+3), extracapsular extension, but margins were clear.  Seminal vesicles were not involved and 0 of 8 lymph nodes were negative for disease.  Patient noted to have a rising PSA and underwent salvage XRT in Spring of 2017.  Nuclear med bone scan on May 30, 2017 revealed improvement of uptake in the right posterior inferior pubic ramus and distal left femoral metadiaphysis.  PET scan on October 31, 2018 revealed no evidence of disease.  Patient reinitiated Lupron/Eligard on November 02, 2018.  He subsequently initiated Xtandi in June 2022.   ASSESSMENT: Stage IIIb prostate cancer  PLAN:    Stage IIIb prostate cancer: Previously, patient's PSA tripled over a 9-month timeframe from from 21.43-65.34.  Despite this, F18-PYLARIFY PET scan on Mar 11, 2021 revealed no evidence of disease.  He was subsequently initiated Xtandi in June 2022.  Patient's PSA trended down to a nadir of 0.06 on October 04, 2022, but now continues to slowly trend up and his most recent result was 1.57.  Proceed with Eligard today.  Continue Xtandi as prescribed.  Will repeat PET scan in the next several weeks.  Patient will then return to clinic 1 week later to discuss the results and to determine whether we continue current therapy or possibly change treatments.  Cardiac disease: Continue follow-up with cardiology as scheduled. Hypertension: Patient's blood pressure is within normal limits today.   I spent a total of 30 minutes reviewing chart data, face-to-face evaluation with the patient, counseling and coordination of care as detailed above.   Patient expressed understanding and was in agreement with this plan. He also understands that He can call  clinic at any time with any questions, concerns, or complaints.    Cancer Staging  Prostate cancer [facility]) Staging form: Prostate, AJCC  8th Edition - Clinical stage from 07/08/2018: Stage IIIB (cT3a, cN0, cM0, Grade Group: 3) - Signed by Jeralyn Ruths, MD on 07/08/2018 Gleason score: 7 Histologic grading system: 5 grade system   Jeralyn Ruths, MD   10/07/2023 11:11 AM

## 2023-10-11 ENCOUNTER — Other Ambulatory Visit: Payer: Self-pay | Admitting: Oncology

## 2023-10-11 NOTE — Telephone Encounter (Signed)
CBC with Differential (Cancer Center Only) Order: [id number] Status: Final result     Visible to patient: Yes (seen)     Next appt: 10/20/2023 at 01:30 PM in Radiology ([facility]-PET INFUSION)     Dx: Prostate cancer (HCC)   0 Result Notes          Component Ref Range & Units 5 d ago (10/06/23) 3 mo ago (07/13/23) 6 mo ago (04/06/23) 7 mo ago (02/18/23) 7 mo ago (02/18/23) 9 mo ago (01/04/23) 1 yr ago (10/04/22)  WBC Count 4.0 - 10.5 K/uL 7.0 6.1 5.7 6.7 7.2 6.6 6.3  RBC 4.22 - 5.81 MIL/uL 4.49 4.16 Low  3.94 Low  4.29 4.57 4.37 4.45  Hemoglobin 13.0 - 17.0 g/dL 78.2 95.6 21.3 Low  08.6 14.4 13.5 13.9  HCT 39.0 - 52.0 % 42.4 38.3 Low  36.9 Low  39.8 43.3 40.4 41.1  MCV 80.0 - 100.0 fL 94.4 92.1 93.7 92.8 94.7 92.4 92.4  MCH 26.0 - 34.0 pg 31.4 31.5 32.0 31.7 31.5 30.9 31.2  MCHC 30.0 - 36.0 g/dL 57.8 46.9 62.9 52.8 41.3 33.4 33.8  RDW 11.5 - 15.5 % 13.2 13.4 13.6 13.5 13.5 13.2 13.2  Platelet Count 150 - 400 K/uL 181 168 166 176 188 178 181  nRBC 0.0 - 0.2 % 0.0 0.0 0.0 0.0 CM 0.0 CM 0.0 0.0  Neutrophils Relative % % 44 40 46   40 45  Neutro Abs 1.7 - 7.7 K/uL 3.0 2.4 2.6   2.6 2.8  Lymphocytes Relative % 42 40 38   39 41  Lymphs Abs 0.7 - 4.0 K/uL 3.0 2.5 2.2   2.7 2.6  Monocytes Relative % 9 11 9   11 9   Monocytes Absolute 0.1 - 1.0 K/uL 0.6 0.7 0.5   0.7 0.6  Eosinophils Relative % 4 8 6   8 4   Eosinophils Absolute 0.0 - 0.5 K/uL 0.3 0.5 0.4   0.5 0.3  Basophils Relative % 1 0 1   1 1   Basophils Absolute 0.0 - 0.1 K/uL 0.0 0.0 0.0   0.0 0.0  Immature Granulocytes % 0 1 0   1 0  Abs Immature Granulocytes 0.00 - 0.07 K/uL 0.02 0.05 CM 0.02 CM   0.03 CM 0.02 CM  Comment: Performed at [facility], [address] [place], [place] [address]  Resulting Agency [facility]         Specimen Collected: 10/06/23 10:20 Last Resulted: 10/06/23 10:35      Lab Flowsheet      Order Details       View Encounter      Lab and Collection Details      Routing      Result History    View All Conversations on this Encounter      CM=Additional comments      Result Care Coordination   Patient Communication   Add Comments   Seen Back to Top    Other Results from 10/06/2023  PSA Order: [id number] Status: Final result      Visible to patient: Yes (seen)      Next appt: 10/20/2023 at 01:30 PM in Radiology ([facility]PET INFUSION)      Dx: Prostate cancer (HCC)    0 Result Notes            Component Ref Range & Units 5 d ago (10/06/23) 1  mo ago (08/25/23) 3 mo ago (07/13/23) 6 mo ago (04/06/23) 9 mo ago (01/04/23) 1 yr ago (10/04/22) 1 yr ago (07/14/22)  Prostatic Specific Antigen 0.00 - 4.00 ng/mL 1.57 1.19 CM 1.04 CM 0.23 CM 0.12 CM 0.06 CM 0.07 CM  Comment: (NOTE) While PSA levels of <=4.00 ng/ml are reported as reference range, some men with levels below 4.00 ng/ml can have prostate cancer and many men with PSA above 4.00 ng/ml do not have prostate cancer.  Other tests such as free PSA, age specific reference ranges, PSA velocity and PSA doubling time may be helpful especially in men less than 16 years old. Performed at [facility], [address], [place], [place] [address]  Resulting Agency [facility]         Specimen Collected: 10/06/23 10:20 Last Resulted: 10/06/23 15:25      Lab Flowsheet       Order Details       View Encounter       Lab and Collection Details       Routing       Result History     View All Conversations on this Encounter      CM=Additional comments      Result Care Coordination   Patient Communication   Add Comments   Seen Back to Top       Contains abnormal data CMP (Cancer Center only) Order: [id number] Status: Final result      Visible to patient: Yes (seen)      Next appt: 10/20/2023 at 01:30 PM in Radiology ([facility]PET  INFUSION)      Dx: Prostate cancer (HCC)    0 Result Notes      1 HM Topic            Component Ref Range & Units 5 d ago (10/06/23) 3 mo ago (07/13/23) 6 mo ago (04/06/23) 7 mo ago (02/18/23) 7 mo ago (02/18/23) 9 mo ago (01/04/23) 1 yr ago (10/04/22)  Sodium 135 - 145 mmol/L 139 134 Low  137 140 138 136 139  Potassium 3.5 - 5.1 mmol/L 4.4 3.7 3.6 3.5 3.4 Low  3.9 3.9  Chloride 98 - 111 mmol/L 105 103 105 105 105 103 103  CO2 22 - 32 mmol/L 26 24 23 25 21  Low  25 26  Glucose, Bld 70 - 99 mg/dL 191 High  478 High  CM 134 High  CM 128 High  CM 163 High  CM 109 High  CM 118 High  CM  Comment: Glucose reference range applies only to samples taken after fasting for at least 8 hours.  BUN 8 - 23 mg/dL 17 14 16 16 18 17 17   Creatinine 0.61 - 1.24 mg/dL 2.95 6.21 3.08 6.57 8.46 1.28 High  1.14  Calcium 8.9 - 10.3 mg/dL 96.2 9.2 9.4 9.8 9.7 9.2 9.5  Total Protein 6.5 - 8.1 g/dL 7.2        Albumin 3.5 - 5.0 g/dL 4.5        AST 15 - 41 U/L 34        ALT 0 - 44 U/L 31        Alkaline Phosphatase 38 - 126 U/L 67        Total Bilirubin <1.2 mg/dL 1.8 High         GFR, Estimated >60 mL/min >60 >60 CM >  60 CM      Comment: (NOTE) Calculated using the CKD-EPI Creatinine Equation (2021)  Anion gap 5 - 15 8 7  CM 9 CM 10 CM 12 CM 8 CM 10 CM  Comment: Performed at [facility], [address] [place], [place] [address]  Resulting Agency [facility]         Specimen Collected: 10/06/23 10:20 Last Resulted: 10/06/23 10:52

## 2023-10-20 ENCOUNTER — Ambulatory Visit
Admission: RE | Admit: 2023-10-20 | Discharge: 2023-10-20 | Disposition: A | Discharge status: Home / Self Care | Payer: Medicare Other | Source: Ambulatory Visit | Attending: Oncology | Admitting: Oncology

## 2023-10-20 DIAGNOSIS — C61 Malignant neoplasm of prostate: Secondary | ICD-10-CM | POA: Insufficient documentation

## 2023-10-20 MED ORDER — FLOTUFOLASTAT F 18 GALLIUM 296-5846 MBQ/ML IV SOLN
8.3800 | Freq: Once | INTRAVENOUS | Status: AC
  Administered 2023-10-20: 8.38 via INTRAVENOUS
  Filled 2023-10-20: qty 9

## 2023-10-28 ENCOUNTER — Other Ambulatory Visit: Payer: Self-pay | Admitting: Oncology

## 2023-10-28 ENCOUNTER — Telehealth: Payer: Self-pay | Admitting: *Deleted

## 2023-10-28 ENCOUNTER — Other Ambulatory Visit: Payer: Self-pay | Admitting: *Deleted

## 2023-10-28 MED ORDER — ENZALUTAMIDE 40 MG PO TABS: 160.0000 mg | ORAL_TABLET | Freq: Every day | ORAL | 0 refills | Status: DC

## 2023-10-28 NOTE — Telephone Encounter (Signed)
Pharmacy requests refill for XTANDI. [phone]

## 2023-10-31 ENCOUNTER — Inpatient Hospital Stay (HOSPITAL_BASED_OUTPATIENT_CLINIC_OR_DEPARTMENT_OTHER): Payer: Medicare Other | Admitting: Oncology

## 2023-10-31 ENCOUNTER — Encounter: Payer: Self-pay | Admitting: Oncology

## 2023-10-31 VITALS — BP 121/82 | HR 65 | Temp 98.5°F | Resp 16 | Ht 69.0 in | Wt 209.0 lb

## 2023-10-31 DIAGNOSIS — C61 Malignant neoplasm of prostate: Secondary | ICD-10-CM | POA: Diagnosis not present

## 2023-10-31 DIAGNOSIS — Z5111 Encounter for antineoplastic chemotherapy: Secondary | ICD-10-CM | POA: Diagnosis not present

## 2023-10-31 NOTE — Progress Notes (Signed)
[facility]  Telephone:([phone] Fax:(336) [phone]  ID: Cody Ellison Sr. OB: Jan 31, 1951  MR#: [id number]  LOV#:[id number]  Patient Care Team: Danella Penton, MD as PCP - General (Internal Medicine) Jeralyn Ruths, MD as Consulting Physician (Oncology)  CHIEF COMPLAINT: Stage IIIb prostate cancer  INTERVAL HISTORY: Patient returns to clinic today for further evaluation and discussion of his PET scan results.  He continues to feel well and remains asymptomatic.  He is tolerating Xtandi and Eligard without significant side effects.  He does not complain of any weakness or fatigue.  He continues to tolerate his treatments without significant side effects.  He denies any pain. He has no neurologic complaints.  He denies any recent fevers or illnesses.  He has a good appetite and denies weight loss.  He denies any chest pain, shortness of breath, cough, or hemoptysis.  He denies any nausea, vomiting, constipation, or diarrhea.  He has no urinary complaints.  Patient offers no specific complaints today.  REVIEW OF SYSTEMS:   Review of Systems  Constitutional: Negative.  Negative for fever, malaise/fatigue and weight loss.  Respiratory: Negative.  Negative for cough, hemoptysis and shortness of breath.   Cardiovascular: Negative.  Negative for chest pain and leg swelling.  Gastrointestinal: Negative.  Negative for abdominal pain.  Genitourinary: Negative.  Negative for dysuria and hematuria.  Musculoskeletal: Negative.  Negative for back pain.  Skin: Negative.  Negative for rash.  Neurological: Negative.  Negative for dizziness, sensory change, focal weakness, weakness and headaches.  Psychiatric/Behavioral: Negative.  The patient is not nervous/anxious.     As per HPI. Otherwise, a complete review of systems is negative.  PAST MEDICAL HISTORY: Past Medical History:  Diagnosis Date   Arthritis    lower back    Cancer [facility])    prostate cancer    Coronary  artery disease    stent RCA- 2000 and 12/2009    GERD (gastroesophageal reflux disease)    Hyperlipidemia    Hypertension    MI (myocardial infarction) (HCC) 2024   Skin cancer     PAST SURGICAL HISTORY: Past Surgical History:  Procedure Laterality Date   APPENDECTOMY     COLONOSCOPY WITH PROPOFOL N/A 07/18/2017   Procedure: COLONOSCOPY WITH PROPOFOL;  Surgeon: Christena Deem, MD;  Location: [facility];  Service: Endoscopy;  Laterality: N/A;   CORONARY ANGIOPLASTY     coronary stents      x 2   KNEE ARTHROPLASTY Left 07/20/2017   Procedure: COMPUTER ASSISTED TOTAL KNEE ARTHROPLASTY;  Surgeon: Donato Heinz, MD;  Location: [facility] ORS;  Service: Orthopedics;  Laterality: Left;   KNEE ARTHROPLASTY Right 01/23/2020   Procedure: COMPUTER ASSISTED TOTAL KNEE ARTHROPLASTY;  Surgeon: Donato Heinz, MD;  Location: [facility] ORS;  Service: Orthopedics;  Laterality: Right;   ROBOT ASSISTED LAPAROSCOPIC RADICAL PROSTATECTOMY N/A 07/16/2013   Procedure: ROBOTIC ASSISTED LAPAROSCOPIC RADICAL PROSTATECTOMY LEVEL 2, bilateral pelvic lymphadenectomy;  Surgeon: Crecencio Mc, MD;  Location: [facility] ORS;  Service: Urology;  Laterality: N/A;   TONSILLECTOMY     and adenoidectomy     FAMILY HISTORY: Family History  Problem Relation Age of Onset   Cancer Mother 81       cervical cancer   Heart attack Father    Thyroid cancer Daughter 85       doing well    ADVANCED DIRECTIVES (Y/N):  N  HEALTH MAINTENANCE: Social History   Tobacco Use   Smoking status: Former    Current  packs/day: 0.00    Average packs/day: 1 pack/day for 30.0 years (30.0 ttl pk-yrs)    Types: Cigarettes    Start date: 11/01/1962    Quit date: 11/01/1992    Years since quitting: 31.0   Smokeless tobacco: Never  Vaping Use   Vaping status: Never Used  Substance Use Topics   Alcohol use: No   Drug use: No     Colonoscopy:  PAP:  Bone density:  Lipid panel:  Allergies  Allergen Reactions   Ampicillin Other (See Comments)     Raw mouth and throat    Atorvastatin     Other reaction(s): Unknown   Carvedilol     Other reaction(s): Dizziness   Contrast Media [Iodinated Contrast Media] Hives   Diltiazem Hcl Other (See Comments)   Fluvastatin Other (See Comments)   Lovastatin Other (See Comments)   Sulfa Antibiotics Hives   Betadine [Povidone Iodine] Rash   Celecoxib Palpitations    Current Outpatient Medications  Medication Sig Dispense Refill   acetaminophen (TYLENOL) 500 MG tablet Take 1,000 mg by mouth 2 (two) times daily as needed.      aspirin EC 81 MG tablet Take 81 mg by mouth daily. Swallow whole.     atenolol (TENORMIN) 100 MG tablet Take 50 mg by mouth every morning.     b complex vitamins capsule Take 1 capsule by mouth daily.     Calcium Carbonate-Vit D-Min (CALTRATE 600+D PLUS PO) Take 1 tablet by mouth daily.      Coenzyme Q10 (COQ10) 100 MG CAPS Take 100 mg by mouth at bedtime.      enzalutamide (XTANDI) 40 MG tablet Take 4 tablets (160 mg total) by mouth daily. Take 4 tablets (160 mg total) by mouth daily. 120 tablet 0   felodipine (PLENDIL) 10 MG 24 hr tablet Take 10 mg by mouth at bedtime.      fluticasone (FLONASE) 50 MCG/ACT nasal spray SPRAY 2 SPRAYS INTO EACH NOSTRIL EVERY DAY     folic acid (FOLVITE) 800 MCG tablet Take 800 mcg by mouth at bedtime.      heparin [address] UT/250ML infusion Inject 1,200 Units/hr into the vein continuous. (Patient not taking: Reported on 10/31/2023)     Inositol Niacinate (NIACIN FLUSH FREE) 500 MG CAPS Take 1,000 mg by mouth in the morning and at bedtime.     Leuprolide Acetate, 6 Month, (LUPRON DEPOT, 80-MONTH,) 45 MG injection Inject 45 mg into the muscle every 6 (six) months.     lisinopril-hydrochlorothiazide (PRINZIDE,ZESTORETIC) 20-12.5 MG per tablet Take 1 tablet by mouth every morning.     nitroGLYCERIN (NITROSTAT) 0.4 MG SL tablet Place 1 tablet (0.4 mg total) under the tongue every 5 (five) minutes x 3 doses as needed for chest pain.  12   NON  FORMULARY MINERAL OIL--USE FOR CONSTIPATION.     omeprazole (PRILOSEC) 20 MG capsule Take 20 mg by mouth daily.     prasugrel (EFFIENT) 10 MG TABS tablet Take 10 mg by mouth daily.     rosuvastatin (CRESTOR) 10 MG tablet Take 20 mg by mouth daily.     triamcinolone (NASACORT) 55 MCG/ACT AERO nasal inhaler Place 2 sprays into the nose daily.     vitamin B-12 (CYANOCOBALAMIN) 1000 MCG tablet Take 1,000 mcg by mouth daily.     vitamin E 400 UNIT capsule Take 400 Units by mouth daily.     No current facility-administered medications for this visit.   Facility-Administered Medications Ordered in Other Visits  Medication Dose Route Frequency Provider Last Rate Last Admin   Leuprolide Acetate (6 Month) (LUPRON) injection 45 mg  45 mg Intramuscular Once Jeralyn Ruths, MD        OBJECTIVE: Vitals:   10/31/23 0949  BP: 121/82  Pulse: 65  Resp: 16  Temp: 98.5 F (36.9 C)  SpO2: 100%     Body mass index is 30.86 kg/m.    ECOG FS:0 - Asymptomatic  General: Well-developed, well-nourished, no acute distress. Eyes: Pink conjunctiva, anicteric sclera. HEENT: Normocephalic, moist mucous membranes. Lungs: No audible wheezing or coughing. Heart: Regular rate and rhythm. Abdomen: Soft, nontender, no obvious distention. Musculoskeletal: No edema, cyanosis, or clubbing. Neuro: Alert, answering all questions appropriately. Cranial nerves grossly intact. Skin: No rashes or petechiae noted. Psych: Normal affect.  LAB RESULTS:  Lab Results  Component Value Date   NA 139 10/06/2023   K 4.4 10/06/2023   CL 105 10/06/2023   CO2 26 10/06/2023   GLUCOSE 128 (H) 10/06/2023   BUN 17 10/06/2023   CREATININE 1.17 10/06/2023   CALCIUM 10.0 10/06/2023   PROT 7.2 10/06/2023   ALBUMIN 4.5 10/06/2023   AST 34 10/06/2023   ALT 31 10/06/2023   ALKPHOS 67 10/06/2023   BILITOT 1.8 (H) 10/06/2023   GFRNONAA >60 10/06/2023   GFRAA >60 01/15/2020    Lab Results  Component Value Date   WBC 7.0  10/06/2023   NEUTROABS 3.0 10/06/2023   HGB 14.1 10/06/2023   HCT 42.4 10/06/2023   MCV 94.4 10/06/2023   PLT 181 10/06/2023     STUDIES: No results found.   ONCOLOGY TREATMENT HISTORY: Patient underwent radical prostatectomy on July 06, 2013.  Final pathology reported Gleason 7 (4+3), extracapsular extension, but margins were clear.  Seminal vesicles were not involved and 0 of 8 lymph nodes were negative for disease.  Patient noted to have a rising PSA and underwent salvage XRT in Spring of 2017.  Nuclear med bone scan on May 30, 2017 revealed improvement of uptake in the right posterior inferior pubic ramus and distal left femoral metadiaphysis.  PET scan on October 31, 2018 revealed no evidence of disease.  Patient reinitiated Lupron/Eligard on November 02, 2018.  He subsequently initiated Xtandi in June 2022.   ASSESSMENT: Stage IIIb prostate cancer  PLAN:    Stage IIIb prostate cancer: Previously, patient's PSA tripled over a 1-month timeframe from from 21.43-65.34.  Despite this, F18-PYLARIFY PET scan on Mar 11, 2021 revealed no evidence of disease.  He was subsequently initiated Xtandi in June 2022.  Patient's PSA trended down to a nadir of 0.06 on October 04, 2022, but now continues to slowly trend up and his most recent result was 1.57.  His most recent PET scan on October 20, 2023 revealed no evidence of disease.  Patient last received Eligard on October 12, 2023.  Continue Xtandi and Eligard as planned.  No further interventions are needed.  Return to clinic in 3 months for laboratory work and evaluation by clinical pharmacy.  Patient will then return to clinic in 6 months for further evaluation and continuation of treatment.  If PSA continues to trend up, can consider repeating PET scan in 6 to 12 months.   Cardiac disease: Continue follow-up with cardiology as scheduled. Hypertension: Patient's blood pressure is within normal limits today.  I spent a total of 20 minutes  reviewing chart data, face-to-face evaluation with the patient, counseling and coordination of care as detailed above.   Patient expressed understanding  and was in agreement with this plan. He also understands that He can call clinic at any time with any questions, concerns, or complaints.    Cancer Staging  Prostate cancer [facility]) Staging form: Prostate, AJCC 8th Edition - Clinical stage from 07/08/2018: Stage IIIB (cT3a, cN0, cM0, Grade Group: 3) - Signed by Jeralyn Ruths, MD on 07/08/2018 Gleason score: 7 Histologic grading system: 5 grade system   Jeralyn Ruths, MD   10/31/2023 10:04 AM

## 2023-11-04 MED ORDER — ENZALUTAMIDE 40 MG PO TABS
160.0000 mg | ORAL_TABLET | Freq: Every day | ORAL | 1 refills | Status: DC
  Filled 2023-11-15: qty 120, 30d supply, fill #0
  Filled 2023-12-08: qty 120, 30d supply, fill #1

## 2023-11-15 ENCOUNTER — Other Ambulatory Visit (HOSPITAL_COMMUNITY): Payer: Self-pay

## 2023-11-15 ENCOUNTER — Other Ambulatory Visit: Payer: Self-pay

## 2023-11-15 ENCOUNTER — Encounter: Payer: Self-pay | Admitting: Oncology

## 2023-11-15 NOTE — Addendum Note (Signed)
 Addended by: Remi Haggard on: 11/15/2023 11:16 AM   Modules accepted: Orders

## 2023-11-15 NOTE — Progress Notes (Signed)
 Specialty Pharmacy Initiation Note   Cody ONEIDA [place] Sr. is a 73 y.o. male who will be followed by the specialty pharmacy service for RxSp Oncology    Review of administration, indication, effectiveness, safety, potential side effects, storage/disposable, and missed dose instructions occurred today for patient's specialty medication(s) Enzalutamide  (XTANDI )     Patient/Caregiver did not have any additional questions or concerns.   Patient's therapy is appropriate to: Continue    Goals Addressed             This Visit's Progress    Slow Disease Progression       Patient is on track. Patient will maintain adherence         Patient switching from PAP to filling at [facility] (Specialty)  Cody Ellison Specialty Pharmacist

## 2023-11-15 NOTE — Progress Notes (Signed)
 Specialty Pharmacy Initial Fill Coordination Note  Cody RUEB Sr. is a 73 y.o. male contacted today regarding initial fill of specialty medication(s) Enzalutamide  (XTANDI )  Patient requested Delivery   Delivery date: 11/17/23   Verified address: [address] [place], [place] [address]  Medication will be filled on 11/16/23.   Patient is aware of $0.00 copayment. Bill [facility] Secondary.   Morene Potters, CPhT Oncology Pharmacy Patient Advocate  [facility]  [phone] (phone) [phone] (fax) 11/15/2023 10:13 AM

## 2023-11-16 ENCOUNTER — Other Ambulatory Visit: Payer: Self-pay

## 2023-12-08 ENCOUNTER — Other Ambulatory Visit: Payer: Self-pay

## 2023-12-08 ENCOUNTER — Other Ambulatory Visit (HOSPITAL_COMMUNITY): Payer: Self-pay

## 2023-12-08 NOTE — Progress Notes (Signed)
 Specialty Pharmacy Refill Coordination Note  Cody DOLLINS Sr. is a 73 y.o. male contacted today regarding refills of specialty medication(s) Enzalutamide  (XTANDI )   Patient requested Delivery   Delivery date: 12/14/23   Verified address: [address] [place], [place] [address]   Medication will be filled on 12/13/23.

## 2023-12-08 NOTE — Progress Notes (Signed)
 Specialty Pharmacy Ongoing Clinical Assessment Note  Cody Ellison Sr. is a 73 y.o. male who is being followed by the specialty pharmacy service for RxSp Oncology   Patient's specialty medication(s) reviewed today: Enzalutamide  (XTANDI )   Missed doses in the last 4 weeks: 0   Patient/Caregiver did not have any additional questions or concerns.   Therapeutic benefit summary: Patient is achieving benefit   Adverse events/side effects summary: No adverse events/side effects   Patient's therapy is appropriate to: Continue    Goals Addressed             This Visit's Progress    Slow Disease Progression   On track    Patient is on track. Patient will maintain adherence         Follow up:  6 months  Nyheim Seufert [place] Specialty Pharmacist

## 2024-01-03 ENCOUNTER — Other Ambulatory Visit: Payer: Self-pay

## 2024-01-03 ENCOUNTER — Other Ambulatory Visit (HOSPITAL_COMMUNITY): Payer: Self-pay

## 2024-01-03 ENCOUNTER — Other Ambulatory Visit: Payer: Self-pay | Admitting: Oncology

## 2024-01-03 MED ORDER — ENZALUTAMIDE 40 MG PO TABS
160.0000 mg | ORAL_TABLET | Freq: Every day | ORAL | 1 refills | Status: DC
  Filled 2024-01-03: qty 120, 30d supply, fill #0

## 2024-01-03 NOTE — Progress Notes (Signed)
 Specialty Pharmacy Refill Coordination Note  Cody CONGER Sr. is a 73 y.o. male contacted today regarding refills of specialty medication(s) Xtandi.  Patient requested (Patient-Rptd) Delivery   Delivery date: (Patient-Rptd) 01/09/24   Verified address: (Patient-Rptd) [address], [place], [place] [address]   Medication will be filled on 01/06/24.   This fill date is pending response to refill request from provider. Patient is aware and if they have not received fill by intended date, they must follow up with pharmacy.

## 2024-01-05 ENCOUNTER — Other Ambulatory Visit: Payer: Self-pay

## 2024-01-06 ENCOUNTER — Other Ambulatory Visit: Payer: Self-pay

## 2024-01-11 ENCOUNTER — Inpatient Hospital Stay: Payer: 59 | Attending: Oncology

## 2024-01-11 DIAGNOSIS — C61 Malignant neoplasm of prostate: Secondary | ICD-10-CM | POA: Insufficient documentation

## 2024-01-11 LAB — CBC WITH DIFFERENTIAL/PLATELET
Abs Immature Granulocytes: 0.02 10*3/uL (ref 0.00–0.07)
Basophils Absolute: 0 10*3/uL (ref 0.0–0.1)
Basophils Relative: 1 %
Eosinophils Absolute: 0.4 10*3/uL (ref 0.0–0.5)
Eosinophils Relative: 5 %
HCT: 36.2 % — ABNORMAL LOW (ref 39.0–52.0)
Hemoglobin: 12.5 g/dL — ABNORMAL LOW (ref 13.0–17.0)
Immature Granulocytes: 0 %
Lymphocytes Relative: 37 %
Lymphs Abs: 2.5 10*3/uL (ref 0.7–4.0)
MCH: 32 pg (ref 26.0–34.0)
MCHC: 34.5 g/dL (ref 30.0–36.0)
MCV: 92.6 fL (ref 80.0–100.0)
Monocytes Absolute: 0.7 10*3/uL (ref 0.1–1.0)
Monocytes Relative: 10 %
Neutro Abs: 3.2 10*3/uL (ref 1.7–7.7)
Neutrophils Relative %: 47 %
Platelets: 168 10*3/uL (ref 150–400)
RBC: 3.91 MIL/uL — ABNORMAL LOW (ref 4.22–5.81)
RDW: 13.2 % (ref 11.5–15.5)
WBC: 6.7 10*3/uL (ref 4.0–10.5)
nRBC: 0 % (ref 0.0–0.2)

## 2024-01-11 LAB — CMP (CANCER CENTER ONLY)
ALT: 19 U/L (ref 0–44)
AST: 22 U/L (ref 15–41)
Albumin: 3.9 g/dL (ref 3.5–5.0)
Alkaline Phosphatase: 55 U/L (ref 38–126)
Anion gap: 9 (ref 5–15)
BUN: 19 mg/dL (ref 8–23)
CO2: 24 mmol/L (ref 22–32)
Calcium: 9.4 mg/dL (ref 8.9–10.3)
Chloride: 102 mmol/L (ref 98–111)
Creatinine: 1.19 mg/dL (ref 0.61–1.24)
GFR, Estimated: >60 mL/min (ref >60)
Glucose, Bld: 138 mg/dL — ABNORMAL HIGH (ref 70–99)
Potassium: 3.7 mmol/L (ref 3.5–5.1)
Sodium: 135 mmol/L (ref 135–145)
Total Bilirubin: 1.2 mg/dL (ref 0.0–1.2)
Total Protein: 6.7 g/dL (ref 6.5–8.1)

## 2024-01-11 LAB — PSA: Prostatic Specific Antigen: 2.67 ng/mL (ref 0.00–4.00)

## 2024-01-12 ENCOUNTER — Other Ambulatory Visit: Payer: PRIVATE HEALTH INSURANCE

## 2024-01-12 ENCOUNTER — Inpatient Hospital Stay: Payer: PRIVATE HEALTH INSURANCE | Admitting: Pharmacist

## 2024-01-12 DIAGNOSIS — C61 Malignant neoplasm of prostate: Secondary | ICD-10-CM

## 2024-01-12 NOTE — Progress Notes (Signed)
 Oral Chemotherapy Clinic [facility]  Telephone:([phone] Fax:(336) [phone]  Patient Care Team: Danella Penton, MD as PCP - General (Internal Medicine) Jeralyn Ruths, MD as Consulting Physician (Oncology)   Name of the patient: Cody Ellison  [id number]  1951-04-24   Date of visit: 01/12/24  HPI: Patient is a 73 y.o. male with non-metastatic castration resistant prostate cancer. Patient started his Diana Eves (enzalutamide) on 04/07/21. Plan to continue Xtandi until disease progression or intolerable side effects.   Reason for Consult: Oral chemotherapy follow-up for Xtandi (enzalutamide) therapy.   PAST MEDICAL HISTORY: Past Medical History:  Diagnosis Date   Arthritis    lower back    Cancer [facility])    prostate cancer    Coronary artery disease    stent RCA- 2000 and 12/2009    GERD (gastroesophageal reflux disease)    Hyperlipidemia    Hypertension    MI (myocardial infarction) (HCC) 2024   Skin cancer     HEMATOLOGY/ONCOLOGY HISTORY:  Oncology History  Prostate cancer (HCC)  07/03/2018 Initial Diagnosis   Prostate cancer (HCC)   07/08/2018 Cancer Staging   Staging form: Prostate, AJCC 8th Edition - Clinical stage from 07/08/2018: Stage IIIB (cT3a, cN0, cM0, Grade Group: 3) - Signed by Jeralyn Ruths, MD on 07/08/2018     ALLERGIES:  is allergic to ampicillin, atorvastatin, carvedilol, contrast media [iodinated contrast media], diltiazem hcl, fluvastatin, lovastatin, sulfa antibiotics, betadine [povidone iodine], and celecoxib.  MEDICATIONS:  Current Outpatient Medications  Medication Sig Dispense Refill   acetaminophen (TYLENOL) 500 MG tablet Take 1,000 mg by mouth 2 (two) times daily as needed.      aspirin EC 81 MG tablet Take 81 mg by mouth daily. Swallow whole.     atenolol (TENORMIN) 100 MG tablet Take 50 mg by mouth every morning.     b complex vitamins capsule Take 1 capsule by mouth daily.     Calcium Carbonate-Vit D-Min (CALTRATE  600+D PLUS PO) Take 1 tablet by mouth daily.      Coenzyme Q10 (COQ10) 100 MG CAPS Take 100 mg by mouth at bedtime.      enzalutamide (XTANDI) 40 MG tablet Take 4 tablets (160 mg total) by mouth daily. 120 tablet 1   felodipine (PLENDIL) 10 MG 24 hr tablet Take 10 mg by mouth at bedtime.      fluticasone (FLONASE) 50 MCG/ACT nasal spray SPRAY 2 SPRAYS INTO EACH NOSTRIL EVERY DAY     folic acid (FOLVITE) 800 MCG tablet Take 800 mcg by mouth at bedtime.      heparin 84696 UT/250ML infusion Inject 1,200 Units/hr into the vein continuous. (Patient not taking: Reported on 10/31/2023)     Inositol Niacinate (NIACIN FLUSH FREE) 500 MG CAPS Take 1,000 mg by mouth in the morning and at bedtime.     Leuprolide Acetate, 6 Month, (LUPRON DEPOT, 33-MONTH,) 45 MG injection Inject 45 mg into the muscle every 6 (six) months.     lisinopril-hydrochlorothiazide (PRINZIDE,ZESTORETIC) 20-12.5 MG per tablet Take 1 tablet by mouth every morning.     nitroGLYCERIN (NITROSTAT) 0.4 MG SL tablet Place 1 tablet (0.4 mg total) under the tongue every 5 (five) minutes x 3 doses as needed for chest pain.  12   NON FORMULARY MINERAL OIL--USE FOR CONSTIPATION.     omeprazole (PRILOSEC) 20 MG capsule Take 20 mg by mouth daily.     prasugrel (EFFIENT) 10 MG TABS tablet Take 10 mg by mouth daily.  rosuvastatin (CRESTOR) 10 MG tablet Take 20 mg by mouth daily.     triamcinolone (NASACORT) 55 MCG/ACT AERO nasal inhaler Place 2 sprays into the nose daily.     vitamin B-12 (CYANOCOBALAMIN) 1000 MCG tablet Take 1,000 mcg by mouth daily.     vitamin E 400 UNIT capsule Take 400 Units by mouth daily.     No current facility-administered medications for this visit.   Facility-Administered Medications Ordered in Other Visits  Medication Dose Route Frequency Provider Last Rate Last Admin   Leuprolide Acetate (6 Month) (LUPRON) injection 45 mg  45 mg Intramuscular Once Jeralyn Ruths, MD        VITAL SIGNS: There were no vitals  taken for this visit. There were no vitals filed for this visit.    Estimated body mass index is 30.86 kg/m as calculated from the following:   Height as of 10/31/23: 5\' 9"  (1.753 m).   Weight as of 10/31/23: 94.8 kg (209 lb).  LABS: CBC:    Component Value Date/Time   WBC 6.7 01/11/2024 1044   HGB 12.5 (L) 01/11/2024 1044   HGB 14.1 10/06/2023 1020   HCT 36.2 (L) 01/11/2024 1044   PLT 168 01/11/2024 1044   PLT 181 10/06/2023 1020   MCV 92.6 01/11/2024 1044   NEUTROABS 3.2 01/11/2024 1044   LYMPHSABS 2.5 01/11/2024 1044   MONOABS 0.7 01/11/2024 1044   EOSABS 0.4 01/11/2024 1044   BASOSABS 0.0 01/11/2024 1044   Comprehensive Metabolic Panel:    Component Value Date/Time   NA 135 01/11/2024 1044   K 3.7 01/11/2024 1044   CL 102 01/11/2024 1044   CO2 24 01/11/2024 1044   BUN 19 01/11/2024 1044   CREATININE 1.19 01/11/2024 1044   GLUCOSE 138 (H) 01/11/2024 1044   CALCIUM 9.4 01/11/2024 1044   AST 22 01/11/2024 1044   ALT 19 01/11/2024 1044   ALKPHOS 55 01/11/2024 1044   BILITOT 1.2 01/11/2024 1044   PROT 6.7 01/11/2024 1044   ALBUMIN 3.9 01/11/2024 1044     Present during today's visit: Patient only  Assessment and Plan: Reviewed CBC/BMP; PSA continues to increase, PSMA PET from 10/20/23 showed no evidence of disease  Continue enzalutamide 160mg  daily for now Patient will have repeat PSMA PET prior to his June 2025 MD appt Will continue to monitor PSA   Oral Chemotherapy Side Effect/Intolerance:  No reported fatigue, headache/dizziness, or edema   Oral Chemotherapy Adherence: no missed doses reported No patient barriers to medication adherence identified.   New medications: none reported  Medication Access Issues: No issue, patient fills at [facility] (Specialty)  Patient expressed understanding and was in agreement with this plan. He also understands that he can call clinic at any time with any questions, concerns, or complaints.   Follow-up  plan: RTC in 3 months lab/MD  Thank you for allowing me to participate in the care of this very pleasant patient.   Time Total: 15 minutes  Visit consisted of counseling and education on dealing with issues of symptom management in the setting of serious and potentially life-threatening illness.Greater than 50%  of this time was spent counseling and coordinating care related to the above assessment and plan.   Remi Haggard, PharmD, BCPS, BCOP, CPP Hematology/Oncology Clinical Pharmacist Practitioner [place]/DB/AP Oral Chemotherapy Navigation Clinic [phone]  01/12/2024 12:28 PM

## 2024-01-13 MED ORDER — ENZALUTAMIDE 80 MG PO TABS
160.0000 mg | ORAL_TABLET | Freq: Every day | ORAL | 1 refills | Status: DC
  Filled 2024-01-13 – 2024-01-31 (×3): qty 60, 30d supply, fill #0
  Filled 2024-03-06: qty 60, 30d supply, fill #1

## 2024-01-13 NOTE — Addendum Note (Signed)
 Addended by: Remi Haggard on: 01/13/2024 04:15 PM   Modules accepted: Orders

## 2024-01-14 ENCOUNTER — Other Ambulatory Visit (HOSPITAL_BASED_OUTPATIENT_CLINIC_OR_DEPARTMENT_OTHER): Payer: Self-pay

## 2024-01-16 ENCOUNTER — Other Ambulatory Visit (HOSPITAL_COMMUNITY): Payer: Self-pay

## 2024-01-31 ENCOUNTER — Other Ambulatory Visit (HOSPITAL_COMMUNITY): Payer: Self-pay

## 2024-01-31 ENCOUNTER — Other Ambulatory Visit: Payer: Self-pay

## 2024-01-31 NOTE — Progress Notes (Signed)
 Specialty Pharmacy Refill Coordination Note  Cody BRUINGTON Sr. is a 73 y.o. male contacted today regarding refills of specialty medication(s) Enzalutamide Diana Eves)   Patient requested Delivery   Delivery date: 02/08/24   Verified address: [address]   [place] [address]   Medication will be filled on 02/07/24.

## 2024-03-06 ENCOUNTER — Other Ambulatory Visit: Payer: Self-pay | Admitting: Pharmacy Technician

## 2024-03-06 ENCOUNTER — Other Ambulatory Visit: Payer: Self-pay

## 2024-03-06 NOTE — Progress Notes (Signed)
 Specialty Pharmacy Refill Coordination Note  Cody SCIULLO Sr. is a 73 y.o. male contacted today regarding refills of specialty medication(s) Enzalutamide  (XTANDI )   Patient requested Delivery   Delivery date: 03/14/24   Verified address: [address], [place], [place] [address]   Medication will be filled on 03/13/24.

## 2024-04-02 ENCOUNTER — Ambulatory Visit
Admission: RE | Admit: 2024-04-02 | Discharge: 2024-04-02 | Disposition: A | Discharge status: Home / Self Care | Payer: PRIVATE HEALTH INSURANCE | Source: Ambulatory Visit | Attending: Oncology | Admitting: Oncology

## 2024-04-02 DIAGNOSIS — I700 Atherosclerosis of aorta: Secondary | ICD-10-CM | POA: Insufficient documentation

## 2024-04-02 DIAGNOSIS — C61 Malignant neoplasm of prostate: Secondary | ICD-10-CM | POA: Diagnosis present

## 2024-04-02 DIAGNOSIS — K8020 Calculus of gallbladder without cholecystitis without obstruction: Secondary | ICD-10-CM | POA: Insufficient documentation

## 2024-04-02 MED ORDER — FLOTUFOLASTAT F 18 GALLIUM 296-5846 MBQ/ML IV SOLN
8.6500 | Freq: Once | INTRAVENOUS | Status: AC
  Administered 2024-04-02: 8.65 via INTRAVENOUS
  Filled 2024-04-02: qty 9

## 2024-04-10 ENCOUNTER — Inpatient Hospital Stay: Payer: PRIVATE HEALTH INSURANCE | Attending: Oncology

## 2024-04-10 DIAGNOSIS — C61 Malignant neoplasm of prostate: Secondary | ICD-10-CM | POA: Insufficient documentation

## 2024-04-10 DIAGNOSIS — Z5111 Encounter for antineoplastic chemotherapy: Secondary | ICD-10-CM | POA: Diagnosis present

## 2024-04-10 DIAGNOSIS — Z79899 Other long term (current) drug therapy: Secondary | ICD-10-CM | POA: Insufficient documentation

## 2024-04-10 LAB — CMP (CANCER CENTER ONLY)
ALT: 27 U/L (ref 0–44)
AST: 28 U/L (ref 15–41)
Albumin: 4.3 g/dL (ref 3.5–5.0)
Alkaline Phosphatase: 55 U/L (ref 38–126)
Anion gap: 9 (ref 5–15)
BUN: 15 mg/dL (ref 8–23)
CO2: 25 mmol/L (ref 22–32)
Calcium: 9.4 mg/dL (ref 8.9–10.3)
Chloride: 105 mmol/L (ref 98–111)
Creatinine: 1.1 mg/dL (ref 0.61–1.24)
GFR, Estimated: >60 mL/min (ref >60)
Glucose, Bld: 126 mg/dL — ABNORMAL HIGH (ref 70–99)
Potassium: 4.1 mmol/L (ref 3.5–5.1)
Sodium: 139 mmol/L (ref 135–145)
Total Bilirubin: 1.5 mg/dL — ABNORMAL HIGH (ref 0.0–1.2)
Total Protein: 7.2 g/dL (ref 6.5–8.1)

## 2024-04-10 LAB — CBC WITH DIFFERENTIAL/PLATELET
Abs Immature Granulocytes: 0.02 10*3/uL (ref 0.00–0.07)
Basophils Absolute: 0 10*3/uL (ref 0.0–0.1)
Basophils Relative: 1 %
Eosinophils Absolute: 0.3 10*3/uL (ref 0.0–0.5)
Eosinophils Relative: 5 %
HCT: 39.7 % (ref 39.0–52.0)
Hemoglobin: 13.4 g/dL (ref 13.0–17.0)
Immature Granulocytes: 0 %
Lymphocytes Relative: 44 %
Lymphs Abs: 2.6 10*3/uL (ref 0.7–4.0)
MCH: 31.5 pg (ref 26.0–34.0)
MCHC: 33.8 g/dL (ref 30.0–36.0)
MCV: 93.4 fL (ref 80.0–100.0)
Monocytes Absolute: 0.6 10*3/uL (ref 0.1–1.0)
Monocytes Relative: 9 %
Neutro Abs: 2.4 10*3/uL (ref 1.7–7.7)
Neutrophils Relative %: 41 %
Platelets: 159 10*3/uL (ref 150–400)
RBC: 4.25 MIL/uL (ref 4.22–5.81)
RDW: 13.2 % (ref 11.5–15.5)
WBC: 5.9 10*3/uL (ref 4.0–10.5)
nRBC: 0 % (ref 0.0–0.2)

## 2024-04-10 LAB — PSA: Prostatic Specific Antigen: 6.89 ng/mL — ABNORMAL HIGH (ref 0.00–4.00)

## 2024-04-11 ENCOUNTER — Inpatient Hospital Stay (HOSPITAL_BASED_OUTPATIENT_CLINIC_OR_DEPARTMENT_OTHER): Payer: PRIVATE HEALTH INSURANCE | Admitting: Oncology

## 2024-04-11 ENCOUNTER — Other Ambulatory Visit: Payer: Self-pay

## 2024-04-11 ENCOUNTER — Inpatient Hospital Stay: Payer: PRIVATE HEALTH INSURANCE

## 2024-04-11 ENCOUNTER — Other Ambulatory Visit: Payer: PRIVATE HEALTH INSURANCE

## 2024-04-11 ENCOUNTER — Encounter: Payer: Self-pay | Admitting: Oncology

## 2024-04-11 ENCOUNTER — Other Ambulatory Visit (HOSPITAL_COMMUNITY): Payer: Self-pay

## 2024-04-11 ENCOUNTER — Inpatient Hospital Stay: Admitting: Pharmacist

## 2024-04-11 ENCOUNTER — Telehealth: Payer: Self-pay | Admitting: Pharmacy Technician

## 2024-04-11 VITALS — BP 122/76 | HR 60 | Temp 97.8°F | Resp 18 | Ht 69.0 in | Wt 207.0 lb

## 2024-04-11 DIAGNOSIS — C61 Malignant neoplasm of prostate: Secondary | ICD-10-CM

## 2024-04-11 DIAGNOSIS — Z5111 Encounter for antineoplastic chemotherapy: Secondary | ICD-10-CM | POA: Diagnosis not present

## 2024-04-11 MED ORDER — LEUPROLIDE ACETATE (6 MONTH) 45 MG ~~LOC~~ KIT
45.0000 mg | PACK | Freq: Once | SUBCUTANEOUS | Status: AC
  Administered 2024-04-11: 45 mg via SUBCUTANEOUS
  Filled 2024-04-11: qty 45

## 2024-04-11 MED ORDER — ABIRATERONE ACETATE 250 MG PO TABS
1000.0000 mg | ORAL_TABLET | Freq: Every day | ORAL | 1 refills | Status: DC
  Filled 2024-04-12: qty 120, 30d supply, fill #0
  Filled 2024-05-09: qty 120, 30d supply, fill #1

## 2024-04-11 MED ORDER — PREDNISONE 5 MG PO TABS
5.0000 mg | ORAL_TABLET | Freq: Every day | ORAL | 1 refills | Status: DC
  Filled 2024-04-11: qty 30, 30d supply, fill #0
  Filled 2024-05-07 – 2024-05-09 (×2): qty 30, 30d supply, fill #1

## 2024-04-11 NOTE — Progress Notes (Signed)
 Clinical Pharmacist Practitioner Clinic [facility]  Telephone:([phone] [phone]  Patient Care Team: Sari Cunning, MD as PCP - General (Internal Medicine) Shellie Dials, MD as Consulting Physician (Oncology)   Name of the patient: Cody Ellison  [id number]  July 28, 1951   Date of visit: 04/11/24  HPI: Patient is a 73 y.o. male with non-metastatic castration resistant prostate cancer. PAtient will switch from treatment with enzalutamide  to abiraterone due to rising PSA.   Reason for Consult: Abiraterone oral chemotherapy education.   PAST MEDICAL HISTORY: Past Medical History:  Diagnosis Date   Arthritis    lower back    Cancer [facility])    prostate cancer    Coronary artery disease    stent RCA- 2000 and 12/2009    GERD (gastroesophageal reflux disease)    Hyperlipidemia    Hypertension    MI (myocardial infarction) (HCC) 2024   Skin cancer     HEMATOLOGY/ONCOLOGY HISTORY:  Oncology History  Prostate cancer (HCC)  07/03/2018 Initial Diagnosis   Prostate cancer (HCC)   07/08/2018 Cancer Staging   Staging form: Prostate, AJCC 8th Edition - Clinical stage from 07/08/2018: Stage IIIB (cT3a, cN0, cM0, Grade Group: 3) - Signed by Shellie Dials, MD on 07/08/2018     ALLERGIES:  is allergic to ampicillin, atorvastatin , carvedilol, contrast media [iodinated contrast media], diltiazem hcl, fluvastatin, lovastatin, sulfa antibiotics, betadine [povidone iodine], and celecoxib .  MEDICATIONS:  Current Outpatient Medications  Medication Sig Dispense Refill   acetaminophen  (TYLENOL ) 500 MG tablet Take 1,000 mg by mouth 2 (two) times daily as needed.      aspirin  EC 81 MG tablet Take 81 mg by mouth daily. Swallow whole.     atenolol  (TENORMIN ) 100 MG tablet Take 50 mg by mouth every morning.     b complex vitamins capsule Take 1 capsule by mouth daily.     Calcium  Carbonate-Vit D-Min (CALTRATE 600+D PLUS PO) Take 1 tablet by mouth daily.       Coenzyme Q10 (COQ10) 100 MG CAPS Take 100 mg by mouth at bedtime.      enzalutamide  (XTANDI ) 80 MG tablet Take 2 tablets (160 mg total) by mouth daily. 60 tablet 1   felodipine  (PLENDIL ) 10 MG 24 hr tablet Take 10 mg by mouth at bedtime.      fluticasone  (FLONASE ) 50 MCG/ACT nasal spray SPRAY 2 SPRAYS INTO EACH NOSTRIL EVERY DAY     folic acid  (FOLVITE ) 800 MCG tablet Take 800 mcg by mouth at bedtime.      heparin  25000 UT/250ML infusion Inject 1,200 Units/hr into the vein continuous. (Patient not taking: Reported on 10/07/2023)     Inositol Niacinate (NIACIN  FLUSH FREE) 500 MG CAPS Take 1,000 mg by mouth in the morning and at bedtime.     Leuprolide  Acetate, 6 Month, (LUPRON  DEPOT, 24-MONTH,) 45 MG injection Inject 45 mg into the muscle every 6 (six) months.     lisinopril -hydrochlorothiazide  (PRINZIDE ,ZESTORETIC ) 20-12.5 MG per tablet Take 1 tablet by mouth every morning.     nitroGLYCERIN  (NITROSTAT ) 0.4 MG SL tablet Place 1 tablet (0.4 mg total) under the tongue every 5 (five) minutes x 3 doses as needed for chest pain.  12   NON FORMULARY MINERAL OIL--USE FOR CONSTIPATION.     omeprazole (PRILOSEC) 20 MG capsule Take 20 mg by mouth daily.     prasugrel (EFFIENT) 10 MG TABS tablet Take 10 mg by mouth daily.     rosuvastatin  (CRESTOR ) 10 MG tablet Take 20  mg by mouth daily.     triamcinolone  (NASACORT ) 55 MCG/ACT AERO nasal inhaler Place 2 sprays into the nose daily.     vitamin B-12 (CYANOCOBALAMIN ) 1000 MCG tablet Take 1,000 mcg by mouth daily.     vitamin E  400 UNIT capsule Take 400 Units by mouth daily.     No current facility-administered medications for this visit.   Facility-Administered Medications Ordered in Other Visits  Medication Dose Route Frequency Provider Last Rate Last Admin   leuprolide  (6 Month) (ELIGARD ) injection 45 mg  45 mg Subcutaneous Once Finnegan, Timothy J, MD       Leuprolide  Acetate (6 Month) (LUPRON ) injection 45 mg  45 mg Intramuscular Once Finnegan, Timothy J,  MD        VITAL SIGNS: There were no vitals taken for this visit. There were no vitals filed for this visit.  Estimated body mass index is 30.57 kg/m as calculated from the following:   Height as of an earlier encounter on 04/11/24: 5' 9 (1.753 m).   Weight as of an earlier encounter on 04/11/24: 93.9 kg (207 lb).  LABS: CBC:    Component Value Date/Time   WBC 5.9 04/10/2024 1011   HGB 13.4 04/10/2024 1011   HGB 14.1 10/06/2023 1020   HCT 39.7 04/10/2024 1011   PLT 159 04/10/2024 1011   PLT 181 10/06/2023 1020   MCV 93.4 04/10/2024 1011   NEUTROABS 2.4 04/10/2024 1011   LYMPHSABS 2.6 04/10/2024 1011   MONOABS 0.6 04/10/2024 1011   EOSABS 0.3 04/10/2024 1011   BASOSABS 0.0 04/10/2024 1011   Comprehensive Metabolic Panel:    Component Value Date/Time   NA 139 04/10/2024 1010   K 4.1 04/10/2024 1010   CL 105 04/10/2024 1010   CO2 25 04/10/2024 1010   BUN 15 04/10/2024 1010   CREATININE 1.10 04/10/2024 1010   GLUCOSE 126 (H) 04/10/2024 1010   CALCIUM  9.4 04/10/2024 1010   AST 28 04/10/2024 1010   ALT 27 04/10/2024 1010   ALKPHOS 55 04/10/2024 1010   BILITOT 1.5 (H) 04/10/2024 1010   PROT 7.2 04/10/2024 1010   ALBUMIN 4.3 04/10/2024 1010     Present during today's visit: patient only  Start plan: Patient will start once he has medication in hand. He will be on vacation from 6/13-6/22. So he will like not be able to get the medication delivered until 04/24/24.   Patient will continue to take his enzalutamide  until his has the abiraterone in hand to switch therapy   Patient Education I spoke with patient for overview of new oral chemotherapy medication: abiraterone   CMP from 04/10/24 assessed, no relevant lab abnormalities. Prescription dose and frequency assessed.   Administration: Counseled patient on administration, dosing, side effects, monitoring, drug-food interactions, safe handling, storage, and disposal. Patient will take:  Abiraterone: Take 4 tablets  (1,000 mg total) by mouth daily. Take on an empty stomach 1 hour before or 2 hours after a meal  Prednisone: Take 1 tablet (5 mg total) by mouth daily with breakfast.   Side Effects: Side effects include but not limited to: edema, hypertension, fatigue, decreased wbc.    Drug-drug Interactions (DDI): Rosuvastatin : Abiraterone Acetate may increase myopathic (rhabdomyolysis) effects of rosuvastatin . Monitor for evidence of muscle toxicities (eg, myopathy, rhabdomyolysis). No baseline dose adjustments needed  Adherence: After discussion with patient no patient barriers to medication adherence identified.  Reviewed with patient importance of keeping a medication schedule and plan for any missed doses.  Mr. Dunsworth voiced  understanding and appreciation. All questions answered. Medication handout provided.  Provided patient with Oral Chemotherapy Navigation Clinic phone number. Patient knows to call the office with questions or concerns. Oral Chemotherapy Navigation Clinic will continue to follow.  Patient expressed understanding and was in agreement with this plan. He also understands that He can call clinic at any time with any questions, concerns, or complaints.   Medication Access Issues: PA pending, but copay should be $0 since patient has met his out of pocket for the year  Follow-up plan: RTC as scheduled  Thank you for allowing me to participate in the care of this patient.   Time Total: 20 mins  Visit consisted of counseling and education on dealing with issues of symptom management in the setting of serious and potentially life-threatening illness.Greater than 50%  of this time was spent counseling and coordinating care related to the above assessment and plan.  Signed by: Shemeca Lukasik N. Syre Knerr, PharmD, Lorraine Roses, CPP Hematology/Oncology Clinical Pharmacist Practitioner [facility] [phone]  04/11/2024 11:33 AM

## 2024-04-11 NOTE — Progress Notes (Signed)
 Patient had a PET scan on 04/02/2024. Patient would like to know what the game plan is. He has noticed that his PSA levels have doubled over the past month and is concerned.

## 2024-04-11 NOTE — Telephone Encounter (Signed)
 Oral Oncology Patient Advocate Encounter   Received notification that prior authorization for Abiraterone is required.   PA submitted on 04/11/2024 Key [id number] Status is pending     Kaspian Muccio (Patty) Benjaman Branch, CPhT  [facility] [place], [place], [place] Oral Chemotherapy Patient Advocate Phone: [phone]  Fax: [phone]

## 2024-04-11 NOTE — Progress Notes (Signed)
 [facility]  [phone] [phone]  ID: Cody Balling Sr. OB: 1951-03-15  MR#: [id number]  AOZ#:[id number]  Patient Care Team: Sari Cunning, MD as PCP - General (Internal Medicine) Shellie Dials, MD as Consulting Physician (Oncology)  CHIEF COMPLAINT: Stage IIIb prostate cancer  INTERVAL HISTORY: Patient returns to clinic today for further evaluation, discussion of his PET scan results, and treatment planning.  He continues to feel well and remains asymptomatic.  He denies any pain.  He does not complain of any weakness or fatigue.  He continues to tolerate his treatments without significant side effects. He has no neurologic complaints.  He denies any recent fevers or illnesses.  He has a good appetite and denies weight loss.  He denies any chest pain, shortness of breath, cough, or hemoptysis.  He denies any nausea, vomiting, constipation, or diarrhea.  He has no urinary complaints.  Patient offers no specific complaints today.  REVIEW OF SYSTEMS:   Review of Systems  Constitutional: Negative.  Negative for fever, malaise/fatigue and weight loss.  Respiratory: Negative.  Negative for cough, hemoptysis and shortness of breath.   Cardiovascular: Negative.  Negative for chest pain and leg swelling.  Gastrointestinal: Negative.  Negative for abdominal pain.  Genitourinary: Negative.  Negative for dysuria and hematuria.  Musculoskeletal: Negative.  Negative for back pain.  Skin: Negative.  Negative for rash.  Neurological: Negative.  Negative for dizziness, sensory change, focal weakness, weakness and headaches.  Psychiatric/Behavioral: Negative.  The patient is not nervous/anxious.     As per HPI. Otherwise, a complete review of systems is negative.  PAST MEDICAL HISTORY: Past Medical History:  Diagnosis Date   Arthritis    lower back    Cancer [facility])    prostate cancer    Coronary artery disease    stent RCA- 2000 and 12/2009     GERD (gastroesophageal reflux disease)    Hyperlipidemia    Hypertension    MI (myocardial infarction) (HCC) 2024   Skin cancer     PAST SURGICAL HISTORY: Past Surgical History:  Procedure Laterality Date   APPENDECTOMY     COLONOSCOPY WITH PROPOFOL  N/A 07/18/2017   Procedure: COLONOSCOPY WITH PROPOFOL ;  Surgeon: Deveron Fly, MD;  Location: [facility] ENDOSCOPY;  Service: Endoscopy;  Laterality: N/A;   CORONARY ANGIOPLASTY     coronary stents      x 2   KNEE ARTHROPLASTY Left 07/20/2017   Procedure: COMPUTER ASSISTED TOTAL KNEE ARTHROPLASTY;  Surgeon: Arlyne Lame, MD;  Location: [facility] ORS;  Service: Orthopedics;  Laterality: Left;   KNEE ARTHROPLASTY Right 01/23/2020   Procedure: COMPUTER ASSISTED TOTAL KNEE ARTHROPLASTY;  Surgeon: Arlyne Lame, MD;  Location: [facility] ORS;  Service: Orthopedics;  Laterality: Right;   ROBOT ASSISTED LAPAROSCOPIC RADICAL PROSTATECTOMY N/A 07/16/2013   Procedure: ROBOTIC ASSISTED LAPAROSCOPIC RADICAL PROSTATECTOMY LEVEL 2, bilateral pelvic lymphadenectomy;  Surgeon: Kristeen Peto, MD;  Location: [facility] ORS;  Service: Urology;  Laterality: N/A;   TONSILLECTOMY     and adenoidectomy     FAMILY HISTORY: Family History  Problem Relation Age of Onset   Cancer Mother 17       cervical cancer   Heart attack Father    Thyroid cancer Daughter 22       doing well    ADVANCED DIRECTIVES (Y/N):  N  HEALTH MAINTENANCE: Social History   Tobacco Use   Smoking status: Former    Current packs/day: 0.00    Average packs/day: 1 pack/day  for 30.0 years (30.0 ttl pk-yrs)    Types: Cigarettes    Start date: 11/01/1962    Quit date: 11/01/1992    Years since quitting: 31.4   Smokeless tobacco: Never  Vaping Use   Vaping status: Never Used  Substance Use Topics   Alcohol use: No   Drug use: No     Colonoscopy:  PAP:  Bone density:  Lipid panel:  Allergies  Allergen Reactions   Ampicillin Other (See Comments)    Raw mouth and throat    Atorvastatin       Other reaction(s): Unknown   Carvedilol     Other reaction(s): Dizziness   Contrast Media [Iodinated Contrast Media] Hives   Diltiazem Hcl Other (See Comments)   Fluvastatin Other (See Comments)   Lovastatin Other (See Comments)   Sulfa Antibiotics Hives   Betadine [Povidone Iodine] Rash   Celecoxib  Palpitations    Current Outpatient Medications  Medication Sig Dispense Refill   acetaminophen  (TYLENOL ) 500 MG tablet Take 1,000 mg by mouth 2 (two) times daily as needed.      aspirin  EC 81 MG tablet Take 81 mg by mouth daily. Swallow whole.     atenolol  (TENORMIN ) 100 MG tablet Take 50 mg by mouth every morning.     b complex vitamins capsule Take 1 capsule by mouth daily.     Calcium  Carbonate-Vit D-Min (CALTRATE 600+D PLUS PO) Take 1 tablet by mouth daily.      Coenzyme Q10 (COQ10) 100 MG CAPS Take 100 mg by mouth at bedtime.      enzalutamide  (XTANDI ) 80 MG tablet Take 2 tablets (160 mg total) by mouth daily. 60 tablet 1   felodipine  (PLENDIL ) 10 MG 24 hr tablet Take 10 mg by mouth at bedtime.      fluticasone  (FLONASE ) 50 MCG/ACT nasal spray SPRAY 2 SPRAYS INTO EACH NOSTRIL EVERY DAY     folic acid  (FOLVITE ) 800 MCG tablet Take 800 mcg by mouth at bedtime.      Inositol Niacinate (NIACIN  FLUSH FREE) 500 MG CAPS Take 1,000 mg by mouth in the morning and at bedtime.     Leuprolide  Acetate, 6 Month, (LUPRON  DEPOT, 91-MONTH,) 45 MG injection Inject 45 mg into the muscle every 6 (six) months.     lisinopril -hydrochlorothiazide  (PRINZIDE ,ZESTORETIC ) 20-12.5 MG per tablet Take 1 tablet by mouth every morning.     nitroGLYCERIN  (NITROSTAT ) 0.4 MG SL tablet Place 1 tablet (0.4 mg total) under the tongue every 5 (five) minutes x 3 doses as needed for chest pain.  12   NON FORMULARY MINERAL OIL--USE FOR CONSTIPATION.     omeprazole (PRILOSEC) 20 MG capsule Take 20 mg by mouth daily.     prasugrel (EFFIENT) 10 MG TABS tablet Take 10 mg by mouth daily.     rosuvastatin  (CRESTOR ) 10 MG tablet Take  20 mg by mouth daily.     triamcinolone  (NASACORT ) 55 MCG/ACT AERO nasal inhaler Place 2 sprays into the nose daily.     vitamin B-12 (CYANOCOBALAMIN ) 1000 MCG tablet Take 1,000 mcg by mouth daily.     vitamin E  400 UNIT capsule Take 400 Units by mouth daily.     heparin  25000 UT/250ML infusion Inject 1,200 Units/hr into the vein continuous. (Patient not taking: Reported on 10/07/2023)     No current facility-administered medications for this visit.   Facility-Administered Medications Ordered in Other Visits  Medication Dose Route Frequency Provider Last Rate Last Admin   Leuprolide  Acetate (6 Month) (LUPRON ) injection 45  mg  45 mg Intramuscular Once Zariana Strub J, MD        OBJECTIVE: Vitals:   04/11/24 1104  BP: 122/76  Pulse: 60  Resp: 18  Temp: 97.8 F (36.6 C)  SpO2: 99%     Body mass index is 30.57 kg/m.    ECOG FS:0 - Asymptomatic  General: Well-developed, well-nourished, no acute distress. Eyes: Pink conjunctiva, anicteric sclera. HEENT: Normocephalic, moist mucous membranes. Lungs: No audible wheezing or coughing. Heart: Regular rate and rhythm. Abdomen: Soft, nontender, no obvious distention. Musculoskeletal: No edema, cyanosis, or clubbing. Neuro: Alert, answering all questions appropriately. Cranial nerves grossly intact. Skin: No rashes or petechiae noted. Psych: Normal affect.  LAB RESULTS:  Lab Results  Component Value Date   NA 139 04/10/2024   K 4.1 04/10/2024   CL 105 04/10/2024   CO2 25 04/10/2024   GLUCOSE 126 (H) 04/10/2024   BUN 15 04/10/2024   CREATININE 1.10 04/10/2024   CALCIUM  9.4 04/10/2024   PROT 7.2 04/10/2024   ALBUMIN 4.3 04/10/2024   AST 28 04/10/2024   ALT 27 04/10/2024   ALKPHOS 55 04/10/2024   BILITOT 1.5 (H) 04/10/2024   GFRNONAA >60 04/10/2024   GFRAA >60 01/15/2020    Lab Results  Component Value Date   WBC 5.9 04/10/2024   NEUTROABS 2.4 04/10/2024   HGB 13.4 04/10/2024   HCT 39.7 04/10/2024   MCV 93.4  04/10/2024   PLT 159 04/10/2024     STUDIES: NM PET (PSMA) SKULL TO MID THIGH Result Date: 04/02/2024 CLINICAL DATA:  Prostate carcinoma with biochemical recurrence. EXAM: NUCLEAR MEDICINE PET SKULL BASE TO THIGH TECHNIQUE: 8.6 mCi Flotufolastat (Posluma ) was injected intravenously. Full-ring PET imaging was performed from the skull base to thigh after the radiotracer. CT data was obtained and used for attenuation correction and anatomic localization. COMPARISON:  None Available. FINDINGS: NECK No radiotracer activity in neck lymph nodes. Incidental CT finding: None. CHEST No radiotracer accumulation within mediastinal or hilar lymph nodes. No suspicious pulmonary nodules on the CT scan. Incidental CT finding: None. ABDOMEN/PELVIS Prostate: No focal activity in prostatectomy bed. Lymph nodes: No abnormal radiotracer accumulation within pelvic or abdominal nodes. Liver: No evidence of liver metastasis. Incidental CT finding: Small gallstones noted. Atherosclerotic calcification of the aorta. SKELETON No focal activity to suggest skeletal metastasis. IMPRESSION: 1. No evidence of prostate cancer recurrence in the prostatectomy bed. 2. No evidence of metastatic adenopathy in the pelvis or periaortic retroperitoneum. 3. No evidence of visceral metastasis or skeletal metastasis. 4.  Aortic Atherosclerosis (ICD10-I70.0). Electronically Signed   By: Deboraha Fallow M.D.   On: 04/02/2024 15:33     ONCOLOGY TREATMENT HISTORY: Patient underwent radical prostatectomy on July 06, 2013.  Final pathology reported Gleason 7 (4+3), extracapsular extension, but margins were clear.  Seminal vesicles were not involved and 0 of 8 lymph nodes were negative for disease.  Patient noted to have a rising PSA and underwent salvage XRT in Spring of 2017.  Nuclear med bone scan on May 30, 2017 revealed improvement of uptake in the right posterior inferior pubic ramus and distal left femoral metadiaphysis.  PET scan on October 31, 2018 revealed no evidence of disease.  Patient reinitiated Lupron /Eligard  on November 02, 2018.  Patient's PSA tripled over a 7-month timeframe from from 21.43-65.34.   Despite this, [id number]  PET scan on Mar 11, 2021 revealed no evidence of disease.  He was subsequently initiated Xtandi  in June 2022.     ASSESSMENT: Stage IIIb prostate cancer  PLAN:    Stage IIIb prostate cancer: See oncology history as above.  Patient's PSA trended down to a nadir of 0.06 on October 04, 2022, but has slowly trended up since that time and nearly tripling from 2.67-6.89 between March and June 2025.  Repeat PSMA PET on April 02, 2024 continue to reveal no evidence of disease.  Proceed with Eligard  as scheduled today.  Will discontinue Xtandi  and switch to Zytiga plus prednisone to see if we can get a better response.  No further intervention is needed.  Return to clinic in approximately 6 weeks with repeat laboratory work and further evaluation.  Appreciate clinical pharmacy input.  Cardiac disease: Continue follow-up with cardiology as scheduled. Hypertension: Blood pressure continues to be within normal limits.  I spent a total of 30 minutes reviewing chart data, face-to-face evaluation with the patient, counseling and coordination of care as detailed above.    Patient expressed understanding and was in agreement with this plan. He also understands that He can call clinic at any time with any questions, concerns, or complaints.    Cancer Staging  Prostate cancer [facility]) Staging form: Prostate, AJCC 8th Edition - Clinical stage from 07/08/2018: Stage IIIB (cT3a, cN0, cM0, Grade Group: 3) - Signed by Shellie Dials, MD on 07/08/2018 Gleason score: 7 Histologic grading system: 5 grade system   Shellie Dials, MD   04/11/2024 11:17 AM

## 2024-04-12 ENCOUNTER — Other Ambulatory Visit: Payer: Self-pay | Admitting: Pharmacy Technician

## 2024-04-12 ENCOUNTER — Other Ambulatory Visit: Payer: Self-pay

## 2024-04-12 ENCOUNTER — Encounter: Payer: Self-pay | Admitting: Oncology

## 2024-04-12 ENCOUNTER — Other Ambulatory Visit (HOSPITAL_COMMUNITY): Payer: Self-pay

## 2024-04-12 NOTE — Progress Notes (Signed)
 Specialty Pharmacy Initial Fill Coordination Note  Cody CULPEPPER Sr. is a 73 y.o. male contacted today regarding refills of specialty medication(s) Abiraterone Acetate (ZYTIGA) .  Patient requested Delivery  on 04/23/24  to verified address [address] [place] [address] (please ship prednisone as well along with this med)   Medication will be filled on 06/20.   Patient is aware of $0 copayment.   Pt request that prednisone be shipped out along with this med.  Elinora Weigand (Patty) Benjaman Branch, CPhT  [facility], [place], [place], [place] Oral Chemotherapy Patient Advocate Phone: [phone]  Fax: [phone]

## 2024-04-12 NOTE — Telephone Encounter (Signed)
 Oral Oncology Patient Advocate Encounter  Prior Authorization for Abiraterone has been approved.    PA# [id number] Effective dates: 04/11/2024 until further notice  Patients co-pay is $0.    Cody Ellison (Patty) Benjaman Branch, CPhT  [facility], [place], [place], [place] Oral Chemotherapy Patient Advocate Phone: [phone]  Fax: [phone]

## 2024-04-12 NOTE — Progress Notes (Signed)
 Patient education documented in EPIC note on 04/11/24.

## 2024-04-20 ENCOUNTER — Other Ambulatory Visit: Payer: Self-pay

## 2024-04-30 ENCOUNTER — Other Ambulatory Visit: Payer: Self-pay

## 2024-05-07 ENCOUNTER — Other Ambulatory Visit (HOSPITAL_COMMUNITY): Payer: Self-pay

## 2024-05-08 ENCOUNTER — Other Ambulatory Visit (HOSPITAL_COMMUNITY): Payer: Self-pay

## 2024-05-09 ENCOUNTER — Other Ambulatory Visit: Payer: Self-pay

## 2024-05-09 ENCOUNTER — Other Ambulatory Visit (HOSPITAL_COMMUNITY): Payer: Self-pay

## 2024-05-09 NOTE — Progress Notes (Signed)
 Specialty Pharmacy Ongoing Clinical Assessment Note  Cody Ellison Sr. is a 73 y.o. male who is being followed by the specialty pharmacy service for RxSp Oncology   Patient's specialty medication(s) reviewed today: Abiraterone  Acetate (ZYTIGA )   Missed doses in the last 4 weeks: 0   Patient/Caregiver did not have any additional questions or concerns.   Therapeutic benefit summary: Unable to assess   Adverse events/side effects summary: Experienced adverse events/side effects (some fatigue, but tolerable)   Patient's therapy is appropriate to: Continue    Goals Addressed             This Visit's Progress    Slow Disease Progression       Patient is unable to be assessed as therapy was recently initiated. Patient will maintain adherence         Follow up: 3 months  [facility] Specialty Pharmacist

## 2024-05-09 NOTE — Progress Notes (Signed)
 Specialty Pharmacy Refill Coordination Note  Cody SQUARE Sr. is a 73 y.o. male contacted today regarding refills of specialty medication(s) Abiraterone  Acetate (ZYTIGA )   Patient requested Delivery   Delivery date: 05/16/24   Verified address: [address] [place] [address]   Medication will be filled on 05/15/24.

## 2024-05-16 ENCOUNTER — Other Ambulatory Visit: Payer: PRIVATE HEALTH INSURANCE

## 2024-05-17 ENCOUNTER — Ambulatory Visit: Payer: PRIVATE HEALTH INSURANCE | Admitting: Oncology

## 2024-05-17 ENCOUNTER — Ambulatory Visit: Payer: PRIVATE HEALTH INSURANCE | Admitting: Pharmacist

## 2024-05-18 ENCOUNTER — Other Ambulatory Visit: Payer: Self-pay | Admitting: *Deleted

## 2024-05-18 DIAGNOSIS — C61 Malignant neoplasm of prostate: Secondary | ICD-10-CM

## 2024-05-21 ENCOUNTER — Inpatient Hospital Stay: Payer: PRIVATE HEALTH INSURANCE | Attending: Oncology

## 2024-05-21 DIAGNOSIS — I519 Heart disease, unspecified: Secondary | ICD-10-CM | POA: Insufficient documentation

## 2024-05-21 DIAGNOSIS — Z808 Family history of malignant neoplasm of other organs or systems: Secondary | ICD-10-CM | POA: Insufficient documentation

## 2024-05-21 DIAGNOSIS — Z87891 Personal history of nicotine dependence: Secondary | ICD-10-CM | POA: Diagnosis not present

## 2024-05-21 DIAGNOSIS — I10 Essential (primary) hypertension: Secondary | ICD-10-CM | POA: Diagnosis not present

## 2024-05-21 DIAGNOSIS — C61 Malignant neoplasm of prostate: Secondary | ICD-10-CM | POA: Insufficient documentation

## 2024-05-21 DIAGNOSIS — Z79899 Other long term (current) drug therapy: Secondary | ICD-10-CM | POA: Diagnosis not present

## 2024-05-21 LAB — CMP (CANCER CENTER ONLY)
ALT: 25 U/L (ref 0–44)
AST: 27 U/L (ref 15–41)
Albumin: 4.6 g/dL (ref 3.5–5.0)
Alkaline Phosphatase: 61 U/L (ref 38–126)
Anion gap: 7 (ref 5–15)
BUN: 17 mg/dL (ref 8–23)
CO2: 25 mmol/L (ref 22–32)
Calcium: 9.9 mg/dL (ref 8.9–10.3)
Chloride: 104 mmol/L (ref 98–111)
Creatinine: 1.11 mg/dL (ref 0.61–1.24)
GFR, Estimated: >60 mL/min (ref >60)
Glucose, Bld: 133 mg/dL — ABNORMAL HIGH (ref 70–99)
Potassium: 3.7 mmol/L (ref 3.5–5.1)
Sodium: 136 mmol/L (ref 135–145)
Total Bilirubin: 1.2 mg/dL (ref 0.0–1.2)
Total Protein: 7.1 g/dL (ref 6.5–8.1)

## 2024-05-21 LAB — CBC WITH DIFFERENTIAL/PLATELET
Abs Immature Granulocytes: 0.02 K/uL (ref 0.00–0.07)
Basophils Absolute: 0 K/uL (ref 0.0–0.1)
Basophils Relative: 1 %
Eosinophils Absolute: 0.5 K/uL (ref 0.0–0.5)
Eosinophils Relative: 6 %
HCT: 39.9 % (ref 39.0–52.0)
Hemoglobin: 13.5 g/dL (ref 13.0–17.0)
Immature Granulocytes: 0 %
Lymphocytes Relative: 41 %
Lymphs Abs: 3.2 K/uL (ref 0.7–4.0)
MCH: 31.7 pg (ref 26.0–34.0)
MCHC: 33.8 g/dL (ref 30.0–36.0)
MCV: 93.7 fL (ref 80.0–100.0)
Monocytes Absolute: 0.7 K/uL (ref 0.1–1.0)
Monocytes Relative: 9 %
Neutro Abs: 3.4 K/uL (ref 1.7–7.7)
Neutrophils Relative %: 43 %
Platelets: 159 K/uL (ref 150–400)
RBC: 4.26 MIL/uL (ref 4.22–5.81)
RDW: 13.2 % (ref 11.5–15.5)
WBC: 7.8 K/uL (ref 4.0–10.5)
nRBC: 0 % (ref 0.0–0.2)

## 2024-05-21 LAB — PSA: Prostatic Specific Antigen: 11.23 ng/mL — ABNORMAL HIGH (ref 0.00–4.00)

## 2024-05-22 ENCOUNTER — Ambulatory Visit: Payer: PRIVATE HEALTH INSURANCE | Admitting: Oncology

## 2024-05-22 ENCOUNTER — Ambulatory Visit: Payer: PRIVATE HEALTH INSURANCE | Admitting: Pharmacist

## 2024-05-31 ENCOUNTER — Encounter: Payer: Self-pay | Admitting: Oncology

## 2024-05-31 ENCOUNTER — Inpatient Hospital Stay: Payer: PRIVATE HEALTH INSURANCE | Admitting: Pharmacist

## 2024-05-31 ENCOUNTER — Inpatient Hospital Stay (HOSPITAL_BASED_OUTPATIENT_CLINIC_OR_DEPARTMENT_OTHER): Payer: PRIVATE HEALTH INSURANCE | Admitting: Oncology

## 2024-05-31 VITALS — BP 123/80 | HR 62 | Temp 97.8°F | Resp 18 | Ht 69.0 in | Wt 210.0 lb

## 2024-05-31 DIAGNOSIS — C61 Malignant neoplasm of prostate: Secondary | ICD-10-CM

## 2024-05-31 NOTE — Progress Notes (Signed)
 Clinical Pharmacist Practitioner Clinic [facility]  [phone] [phone]  Patient Care Team: Cleotilde Oneil FALCON, MD as PCP - General (Internal Medicine) Jacobo Evalene PARAS, MD as Consulting Physician (Oncology)   Name of the patient: Cody Ellison  [id number]  1950/12/08   Date of visit: 05/31/24  HPI: Patient is a 73 y.o. male with prostate cancer previously on enzalutamide  from 04/2021 to 04/2024. He was switched to abiraterone  due to progressive disease (rising PSA). He started abiraterone  on 04/24/24.   Reason for Consult: Oral chemotherapy follow-up for abiraterone  therapy.   PAST MEDICAL HISTORY: Past Medical History:  Diagnosis Date   Arthritis    lower back    Cancer [facility])    prostate cancer    Coronary artery disease    stent RCA- 2000 and 12/2009    GERD (gastroesophageal reflux disease)    Hyperlipidemia    Hypertension    MI (myocardial infarction) (HCC) 2024   Skin cancer     HEMATOLOGY/ONCOLOGY HISTORY:  Oncology History  Prostate cancer (HCC)  07/03/2018 Initial Diagnosis   Prostate cancer (HCC)   07/08/2018 Cancer Staging   Staging form: Prostate, AJCC 8th Edition - Clinical stage from 07/08/2018: Stage IIIB (cT3a, cN0, cM0, Grade Group: 3) - Signed by Jacobo Evalene PARAS, MD on 07/08/2018     ALLERGIES:  is allergic to ampicillin, atorvastatin , carvedilol, contrast media [iodinated contrast media], diltiazem hcl, fluvastatin, lovastatin, sulfa antibiotics, betadine [povidone iodine], and celecoxib .  MEDICATIONS:  Current Outpatient Medications  Medication Sig Dispense Refill   abiraterone  acetate (ZYTIGA ) 250 MG tablet Take 4 tablets (1,000 mg total) by mouth daily. Take on an empty stomach 1 hour before or 2 hours after a meal 120 tablet 1   acetaminophen  (TYLENOL ) 500 MG tablet Take 1,000 mg by mouth 2 (two) times daily as needed.      aspirin  EC 81 MG tablet Take 81 mg by mouth daily. Swallow whole.     atenolol   (TENORMIN ) 100 MG tablet Take 50 mg by mouth every morning.     b complex vitamins capsule Take 1 capsule by mouth daily.     Calcium  Carbonate-Vit D-Min (CALTRATE 600+D PLUS PO) Take 1 tablet by mouth daily.      Coenzyme Q10 (COQ10) 100 MG CAPS Take 100 mg by mouth at bedtime.      felodipine  (PLENDIL ) 10 MG 24 hr tablet Take 10 mg by mouth at bedtime.      fluticasone  (FLONASE ) 50 MCG/ACT nasal spray SPRAY 2 SPRAYS INTO EACH NOSTRIL EVERY DAY     folic acid  (FOLVITE ) 800 MCG tablet Take 800 mcg by mouth at bedtime.      heparin  25000 UT/250ML infusion Inject 1,200 Units/hr into the vein continuous.     Inositol Niacinate (NIACIN  FLUSH FREE) 500 MG CAPS Take 1,000 mg by mouth in the morning and at bedtime.     Leuprolide  Acetate, 6 Month, (LUPRON  DEPOT, 15-MONTH,) 45 MG injection Inject 45 mg into the muscle every 6 (six) months.     lisinopril -hydrochlorothiazide  (PRINZIDE ,ZESTORETIC ) 20-12.5 MG per tablet Take 1 tablet by mouth every morning.     nitroGLYCERIN  (NITROSTAT ) 0.4 MG SL tablet Place 1 tablet (0.4 mg total) under the tongue every 5 (five) minutes x 3 doses as needed for chest pain.  12   NON FORMULARY MINERAL OIL--USE FOR CONSTIPATION.     omeprazole (PRILOSEC) 20 MG capsule Take 20 mg by mouth daily.     prasugrel (EFFIENT) 10 MG TABS  tablet Take 10 mg by mouth daily.     predniSONE  (DELTASONE ) 5 MG tablet Take 1 tablet (5 mg total) by mouth daily with breakfast. 30 tablet 1   rosuvastatin  (CRESTOR ) 10 MG tablet Take 20 mg by mouth daily.     triamcinolone  (NASACORT ) 55 MCG/ACT AERO nasal inhaler Place 2 sprays into the nose daily.     vitamin B-12 (CYANOCOBALAMIN ) 1000 MCG tablet Take 1,000 mcg by mouth daily.     vitamin E  400 UNIT capsule Take 400 Units by mouth daily.     No current facility-administered medications for this visit.   Facility-Administered Medications Ordered in Other Visits  Medication Dose Route Frequency Provider Last Rate Last Admin   Leuprolide  Acetate  (6 Month) (LUPRON ) injection 45 mg  45 mg Intramuscular Once Finnegan, Timothy J, MD        VITAL SIGNS: There were no vitals taken for this visit. There were no vitals filed for this visit.  Estimated body mass index is 31.01 kg/m as calculated from the following:   Height as of an earlier encounter on 05/31/24: 5' 9 (1.753 m).   Weight as of an earlier encounter on 05/31/24: 95.3 kg (210 lb).  LABS: CBC:    Component Value Date/Time   WBC 7.8 05/21/2024 1012   HGB 13.5 05/21/2024 1012   HGB 14.1 10/06/2023 1020   HCT 39.9 05/21/2024 1012   PLT 159 05/21/2024 1012   PLT 181 10/06/2023 1020   MCV 93.7 05/21/2024 1012   NEUTROABS 3.4 05/21/2024 1012   LYMPHSABS 3.2 05/21/2024 1012   MONOABS 0.7 05/21/2024 1012   EOSABS 0.5 05/21/2024 1012   BASOSABS 0.0 05/21/2024 1012   Comprehensive Metabolic Panel:    Component Value Date/Time   NA 136 05/21/2024 1012   K 3.7 05/21/2024 1012   CL 104 05/21/2024 1012   CO2 25 05/21/2024 1012   BUN 17 05/21/2024 1012   CREATININE 1.11 05/21/2024 1012   GLUCOSE 133 (H) 05/21/2024 1012   CALCIUM  9.9 05/21/2024 1012   AST 27 05/21/2024 1012   ALT 25 05/21/2024 1012   ALKPHOS 61 05/21/2024 1012   BILITOT 1.2 05/21/2024 1012   PROT 7.1 05/21/2024 1012   ALBUMIN 4.6 05/21/2024 1012     Present during today's visit: patient only  Assessment and Plan: CBC/CMP/PSA reviewed, PSA continue to increase Continue abiraterone  1000mg  daily and prednisone  Plan to give patient another 4 weeks on treatment to see PSA response Verified patient is taking his abiraterone  correctly and has missed no doses Referral to genetics for testing, pt reported family history on cervical and prostate cancer   Oral Chemotherapy Side Effect/Intolerance:  No reported edema, fatigue, or hypertension. BP at today's visit well controlled.   Oral Chemotherapy Adherence: No missed doses reported No patient barriers to medication adherence identified.   New  medications: None reported  Medication Access Issues: No issues  Patient expressed understanding and was in agreement with this plan. He also understands that He can call clinic at any time with any questions, concerns, or complaints.   Follow-up plan: RTC in 4 weeks  Thank you for allowing me to participate in the care of this very pleasant patient.   Time Total: 15 mins  Visit consisted of counseling and education on dealing with issues of symptom management in the setting of serious and potentially life-threatening illness.Greater than 50%  of this time was spent counseling and coordinating care related to the above assessment and plan.  Signed by:  Lasondra Hodgkins N. Brylan Dec, PharmD, BCOP, CPP Hematology/Oncology Clinical Pharmacist Practitioner [facility] [phone]  05/31/2024 9:52 AM

## 2024-05-31 NOTE — Progress Notes (Signed)
 [facility]  [phone] Fax:(336) [phone]  ID: Cody ONEIDA Vevay Sr. OB: [birth year]  MR#: [id number]  RDW#:[id number]  Patient Care Team: Cleotilde Oneil FALCON, MD as PCP - General (Internal Medicine) Jacobo Evalene PARAS, MD as Consulting Physician (Oncology)  CHIEF COMPLAINT: Stage IIIb prostate cancer  INTERVAL HISTORY: Patient returns to clinic today for repeat laboratory work and to assess his toleration of Zytiga .  He currently feels well and is asymptomatic.  He is tolerating his treatment without significant side effects.  He denies any pain.  He does not complain of any weakness or fatigue.  He continues to tolerate his treatments without significant side effects. He has no neurologic complaints.  He denies any recent fevers or illnesses.  He has a good appetite and denies weight loss.  He denies any chest pain, shortness of breath, cough, or hemoptysis.  He denies any nausea, vomiting, constipation, or diarrhea.  He has no urinary complaints.  Patient offers no specific complaints today.  REVIEW OF SYSTEMS:   Review of Systems  Constitutional: Negative.  Negative for fever, malaise/fatigue and weight loss.  Respiratory: Negative.  Negative for cough, hemoptysis and shortness of breath.   Cardiovascular: Negative.  Negative for chest pain and leg swelling.  Gastrointestinal: Negative.  Negative for abdominal pain.  Genitourinary: Negative.  Negative for dysuria and hematuria.  Musculoskeletal: Negative.  Negative for back pain.  Skin: Negative.  Negative for rash.  Neurological: Negative.  Negative for dizziness, sensory change, focal weakness, weakness and headaches.  Psychiatric/Behavioral: Negative.  The patient is not nervous/anxious.     As per HPI. Otherwise, a complete review of systems is negative.  PAST MEDICAL HISTORY: Past Medical History:  Diagnosis Date   Arthritis    lower back    Cancer [facility])    prostate cancer    Coronary artery  disease    stent RCA- 2000 and 12/2009    GERD (gastroesophageal reflux disease)    Hyperlipidemia    Hypertension    MI (myocardial infarction) (HCC) 2024   Skin cancer     PAST SURGICAL HISTORY: Past Surgical History:  Procedure Laterality Date   APPENDECTOMY     COLONOSCOPY WITH PROPOFOL  N/A 07/18/2017   Procedure: COLONOSCOPY WITH PROPOFOL ;  Surgeon: Gaylyn Gladis PENNER, MD;  Location: [facility] ENDOSCOPY;  Service: Endoscopy;  Laterality: N/A;   CORONARY ANGIOPLASTY     coronary stents      x 2   KNEE ARTHROPLASTY Left 07/20/2017   Procedure: COMPUTER ASSISTED TOTAL KNEE ARTHROPLASTY;  Surgeon: Mardee Lynwood SQUIBB, MD;  Location: [facility] ORS;  Service: Orthopedics;  Laterality: Left;   KNEE ARTHROPLASTY Right 01/23/2020   Procedure: COMPUTER ASSISTED TOTAL KNEE ARTHROPLASTY;  Surgeon: Mardee Lynwood SQUIBB, MD;  Location: [facility] ORS;  Service: Orthopedics;  Laterality: Right;   ROBOT ASSISTED LAPAROSCOPIC RADICAL PROSTATECTOMY N/A 07/16/2013   Procedure: ROBOTIC ASSISTED LAPAROSCOPIC RADICAL PROSTATECTOMY LEVEL 2, bilateral pelvic lymphadenectomy;  Surgeon: Noretta Ferrara, MD;  Location: [facility] ORS;  Service: Urology;  Laterality: N/A;   TONSILLECTOMY     and adenoidectomy     FAMILY HISTORY: Family History  Problem Relation Age of Onset   Cancer Mother 58       cervical cancer   Heart attack Father    Thyroid cancer Daughter 27       doing well    ADVANCED DIRECTIVES (Y/N):  N  HEALTH MAINTENANCE: Social History   Tobacco Use   Smoking status: Former    Current packs/day:  0.00    Average packs/day: 1 pack/day for 30.0 years (30.0 ttl pk-yrs)    Types: Cigarettes    Start date: 11/01/1962    Quit date: 11/01/1992    Years since quitting: 31.6   Smokeless tobacco: Never  Vaping Use   Vaping status: Never Used  Substance Use Topics   Alcohol use: No   Drug use: No     Colonoscopy:  PAP:  Bone density:  Lipid panel:  Allergies  Allergen Reactions   Ampicillin Other (See Comments)     Raw mouth and throat    Atorvastatin      Other reaction(s): Unknown   Carvedilol     Other reaction(s): Dizziness   Contrast Media [Iodinated Contrast Media] Hives   Diltiazem Hcl Other (See Comments)   Fluvastatin Other (See Comments)   Lovastatin Other (See Comments)   Sulfa Antibiotics Hives   Betadine [Povidone Iodine] Rash   Celecoxib  Palpitations    Current Outpatient Medications  Medication Sig Dispense Refill   abiraterone  acetate (ZYTIGA ) 250 MG tablet Take 4 tablets (1,000 mg total) by mouth daily. Take on an empty stomach 1 hour before or 2 hours after a meal 120 tablet 1   acetaminophen  (TYLENOL ) 500 MG tablet Take 1,000 mg by mouth 2 (two) times daily as needed.      aspirin  EC 81 MG tablet Take 81 mg by mouth daily. Swallow whole.     atenolol  (TENORMIN ) 100 MG tablet Take 50 mg by mouth every morning.     b complex vitamins capsule Take 1 capsule by mouth daily.     Calcium  Carbonate-Vit D-Min (CALTRATE 600+D PLUS PO) Take 1 tablet by mouth daily.      Coenzyme Q10 (COQ10) 100 MG CAPS Take 100 mg by mouth at bedtime.      felodipine  (PLENDIL ) 10 MG 24 hr tablet Take 10 mg by mouth at bedtime.      fluticasone  (FLONASE ) 50 MCG/ACT nasal spray SPRAY 2 SPRAYS INTO EACH NOSTRIL EVERY DAY     folic acid  (FOLVITE ) 800 MCG tablet Take 800 mcg by mouth at bedtime.      heparin  25000 UT/250ML infusion Inject 1,200 Units/hr into the vein continuous.     Inositol Niacinate (NIACIN  FLUSH FREE) 500 MG CAPS Take 1,000 mg by mouth in the morning and at bedtime.     Leuprolide  Acetate, 6 Month, (LUPRON  DEPOT, 53-MONTH,) 45 MG injection Inject 45 mg into the muscle every 6 (six) months.     lisinopril -hydrochlorothiazide  (PRINZIDE ,ZESTORETIC ) 20-12.5 MG per tablet Take 1 tablet by mouth every morning.     nitroGLYCERIN  (NITROSTAT ) 0.4 MG SL tablet Place 1 tablet (0.4 mg total) under the tongue every 5 (five) minutes x 3 doses as needed for chest pain.  12   NON FORMULARY MINERAL OIL--USE  FOR CONSTIPATION.     omeprazole (PRILOSEC) 20 MG capsule Take 20 mg by mouth daily.     prasugrel (EFFIENT) 10 MG TABS tablet Take 10 mg by mouth daily.     predniSONE  (DELTASONE ) 5 MG tablet Take 1 tablet (5 mg total) by mouth daily with breakfast. 30 tablet 1   rosuvastatin  (CRESTOR ) 10 MG tablet Take 20 mg by mouth daily.     triamcinolone  (NASACORT ) 55 MCG/ACT AERO nasal inhaler Place 2 sprays into the nose daily.     vitamin B-12 (CYANOCOBALAMIN ) 1000 MCG tablet Take 1,000 mcg by mouth daily.     vitamin E  400 UNIT capsule Take 400 Units by mouth daily.  No current facility-administered medications for this visit.   Facility-Administered Medications Ordered in Other Visits  Medication Dose Route Frequency Provider Last Rate Last Admin   Leuprolide  Acetate (6 Month) (LUPRON ) injection 45 mg  45 mg Intramuscular Once Erian Rosengren J, MD        OBJECTIVE: Vitals:   05/31/24 0905  BP: 123/80  Pulse: 62  Resp: 18  Temp: 97.8 F (36.6 C)  SpO2: 100%     Body mass index is 31.01 kg/m.    ECOG FS:0 - Asymptomatic  General: Well-developed, well-nourished, no acute distress. Eyes: Pink conjunctiva, anicteric sclera. HEENT: Normocephalic, moist mucous membranes. Lungs: No audible wheezing or coughing. Heart: Regular rate and rhythm. Abdomen: Soft, nontender, no obvious distention. Musculoskeletal: No edema, cyanosis, or clubbing. Neuro: Alert, answering all questions appropriately. Cranial nerves grossly intact. Skin: No rashes or petechiae noted. Psych: Normal affect.  LAB RESULTS:  Lab Results  Component Value Date   NA 136 05/21/2024   K 3.7 05/21/2024   CL 104 05/21/2024   CO2 25 05/21/2024   GLUCOSE 133 (H) 05/21/2024   BUN 17 05/21/2024   CREATININE 1.11 05/21/2024   CALCIUM  9.9 05/21/2024   PROT 7.1 05/21/2024   ALBUMIN 4.6 05/21/2024   AST 27 05/21/2024   ALT 25 05/21/2024   ALKPHOS 61 05/21/2024   BILITOT 1.2 05/21/2024   GFRNONAA >60 05/21/2024    GFRAA >60 01/15/2020    Lab Results  Component Value Date   WBC 7.8 05/21/2024   NEUTROABS 3.4 05/21/2024   HGB 13.5 05/21/2024   HCT 39.9 05/21/2024   MCV 93.7 05/21/2024   PLT 159 05/21/2024     STUDIES: No results found.    ONCOLOGY TREATMENT HISTORY: Patient underwent radical prostatectomy on July 06, 2013.  Final pathology reported Gleason 7 (4+3), extracapsular extension, but margins were clear.  Seminal vesicles were not involved and 0 of 8 lymph nodes were negative for disease.  Patient noted to have a rising PSA and underwent salvage XRT in Spring of 2017.  Nuclear med bone scan on May 30, 2017 revealed improvement of uptake in the right posterior inferior pubic ramus and distal left femoral metadiaphysis.  PET scan on October 31, 2018 revealed no evidence of disease.  Patient reinitiated Lupron /Eligard  on November 02, 2018.  Patient's PSA tripled over a 4-month timeframe from from 21.43-65.34.   Despite this, [id number]  PET scan on Mar 11, 2021 revealed no evidence of disease.  Patient took Xtandi  from June 2022 through June 2025 and then was switched to Zytiga  secondary to increasing PSA despite negative PET.     ASSESSMENT: Stage IIIb prostate cancer  PLAN:    Stage IIIb prostate cancer: See oncology history as above.  Patient's PSA trended down to a nadir of 0.06 on October 04, 2022, but has slowly trended up since that time and nearly tripling from 2.67-6.89 between March and June 2025.  Repeat PSMA PET on April 02, 2024 continue to reveal no evidence of disease.  Despite negative PET scan, given the patient's rising PSA Xtandi  was discontinued and patient initiated Zytiga .  PSA continues to increase now was greater than 11.  Will continue Zytiga  for 4 more weeks.  If PSA continues to increase, will discontinue treatment and consider proceeding with Taxotere.  Patient last received Eligard  on April 11, 2024.  Appreciate clinical pharmacy input.  Cardiac disease: Continue  follow-up with cardiology as scheduled. Hypertension: Patient's blood pressure continues to be within normal limits.  I spent  a total of 30 minutes reviewing chart data, face-to-face evaluation with the patient, counseling and coordination of care as detailed above.   Patient expressed understanding and was in agreement with this plan. He also understands that He can call clinic at any time with any questions, concerns, or complaints.    Cancer Staging  Prostate cancer [facility] Staging form: Prostate, AJCC 8th Edition - Clinical stage from 07/08/2018: Stage IIIB (cT3a, cN0, cM0, Grade Group: 3) - Signed by Jacobo Evalene PARAS, MD on 07/08/2018 Gleason score: 7 Histologic grading system: 5 grade system   Evalene PARAS Jacobo, MD   05/31/2024 9:12 AM

## 2024-05-31 NOTE — Progress Notes (Signed)
 Patient is doing good. No new questions for the doctor today.

## 2024-06-07 ENCOUNTER — Encounter (INDEPENDENT_AMBULATORY_CARE_PROVIDER_SITE_OTHER): Payer: Self-pay

## 2024-06-07 ENCOUNTER — Other Ambulatory Visit: Payer: Self-pay | Admitting: Oncology

## 2024-06-07 ENCOUNTER — Other Ambulatory Visit: Payer: Self-pay

## 2024-06-07 DIAGNOSIS — C61 Malignant neoplasm of prostate: Secondary | ICD-10-CM

## 2024-06-08 ENCOUNTER — Other Ambulatory Visit: Payer: Self-pay | Admitting: Pharmacy Technician

## 2024-06-08 ENCOUNTER — Other Ambulatory Visit: Payer: Self-pay

## 2024-06-08 NOTE — Progress Notes (Signed)
 Specialty Pharmacy Refill Coordination Note  Cody VANBLARCOM Sr. is a 73 y.o. male contacted today regarding refills of specialty medication(s)   Abiraterone  Acetate (ZYTIGA     Patient requested (Patient-Rptd) Delivery   Delivery date: 06/15/24 Verified address: (Patient-Rptd) [address], [place], [place]  [address]   Medication will be filled on 06/14/24.    RR sent to Md on Zytiga  & prednisone 

## 2024-06-11 ENCOUNTER — Other Ambulatory Visit: Payer: Self-pay | Admitting: Pharmacist

## 2024-06-11 DIAGNOSIS — C61 Malignant neoplasm of prostate: Secondary | ICD-10-CM

## 2024-06-12 ENCOUNTER — Other Ambulatory Visit: Payer: Self-pay

## 2024-06-12 ENCOUNTER — Other Ambulatory Visit (HOSPITAL_COMMUNITY): Payer: Self-pay

## 2024-06-12 MED ORDER — ABIRATERONE ACETATE 250 MG PO TABS
1000.0000 mg | ORAL_TABLET | Freq: Every day | ORAL | 1 refills | Status: AC
  Filled 2024-06-12: qty 120, 30d supply, fill #0
  Filled 2024-07-11: qty 120, 30d supply, fill #1

## 2024-06-12 MED ORDER — PREDNISONE 5 MG PO TABS
5.0000 mg | ORAL_TABLET | Freq: Every day | ORAL | 1 refills | Status: AC
  Filled 2024-06-12: qty 30, 30d supply, fill #0
  Filled 2024-07-11: qty 30, 30d supply, fill #1

## 2024-06-13 ENCOUNTER — Other Ambulatory Visit: Payer: Self-pay

## 2024-06-27 ENCOUNTER — Inpatient Hospital Stay: Payer: PRIVATE HEALTH INSURANCE | Attending: Oncology

## 2024-06-27 DIAGNOSIS — Z87891 Personal history of nicotine dependence: Secondary | ICD-10-CM | POA: Insufficient documentation

## 2024-06-27 DIAGNOSIS — Z808 Family history of malignant neoplasm of other organs or systems: Secondary | ICD-10-CM | POA: Diagnosis not present

## 2024-06-27 DIAGNOSIS — C61 Malignant neoplasm of prostate: Secondary | ICD-10-CM | POA: Insufficient documentation

## 2024-06-27 DIAGNOSIS — Z8049 Family history of malignant neoplasm of other genital organs: Secondary | ICD-10-CM | POA: Diagnosis not present

## 2024-06-27 DIAGNOSIS — Z85828 Personal history of other malignant neoplasm of skin: Secondary | ICD-10-CM | POA: Insufficient documentation

## 2024-06-27 DIAGNOSIS — I519 Heart disease, unspecified: Secondary | ICD-10-CM | POA: Diagnosis not present

## 2024-06-27 LAB — CBC WITH DIFFERENTIAL/PLATELET
Abs Immature Granulocytes: 0.07 K/uL (ref 0.00–0.07)
Basophils Absolute: 0 K/uL (ref 0.0–0.1)
Basophils Relative: 1 %
Eosinophils Absolute: 0.4 K/uL (ref 0.0–0.5)
Eosinophils Relative: 6 %
HCT: 37 % — ABNORMAL LOW (ref 39.0–52.0)
Hemoglobin: 12.9 g/dL — ABNORMAL LOW (ref 13.0–17.0)
Immature Granulocytes: 1 %
Lymphocytes Relative: 27 %
Lymphs Abs: 2 K/uL (ref 0.7–4.0)
MCH: 32.3 pg (ref 26.0–34.0)
MCHC: 34.9 g/dL (ref 30.0–36.0)
MCV: 92.7 fL (ref 80.0–100.0)
Monocytes Absolute: 0.7 K/uL (ref 0.1–1.0)
Monocytes Relative: 9 %
Neutro Abs: 4.3 K/uL (ref 1.7–7.7)
Neutrophils Relative %: 56 %
Platelets: 148 K/uL — ABNORMAL LOW (ref 150–400)
RBC: 3.99 MIL/uL — ABNORMAL LOW (ref 4.22–5.81)
RDW: 13.3 % (ref 11.5–15.5)
WBC: 7.5 K/uL (ref 4.0–10.5)
nRBC: 0 % (ref 0.0–0.2)

## 2024-06-27 LAB — CMP (CANCER CENTER ONLY)
ALT: 30 U/L (ref 0–44)
AST: 31 U/L (ref 15–41)
Albumin: 4 g/dL (ref 3.5–5.0)
Alkaline Phosphatase: 68 U/L (ref 38–126)
Anion gap: 9 (ref 5–15)
BUN: 19 mg/dL (ref 8–23)
CO2: 23 mmol/L (ref 22–32)
Calcium: 9.3 mg/dL (ref 8.9–10.3)
Chloride: 102 mmol/L (ref 98–111)
Creatinine: 1.09 mg/dL (ref 0.61–1.24)
GFR, Estimated: >60 mL/min (ref >60)
Glucose, Bld: 253 mg/dL — ABNORMAL HIGH (ref 70–99)
Potassium: 3.6 mmol/L (ref 3.5–5.1)
Sodium: 134 mmol/L — ABNORMAL LOW (ref 135–145)
Total Bilirubin: 1.4 mg/dL — ABNORMAL HIGH (ref 0.0–1.2)
Total Protein: 6.3 g/dL — ABNORMAL LOW (ref 6.5–8.1)

## 2024-06-27 LAB — PSA: Prostatic Specific Antigen: 21.14 ng/mL — ABNORMAL HIGH (ref 0.00–4.00)

## 2024-06-28 ENCOUNTER — Inpatient Hospital Stay (HOSPITAL_BASED_OUTPATIENT_CLINIC_OR_DEPARTMENT_OTHER): Payer: PRIVATE HEALTH INSURANCE | Admitting: Oncology

## 2024-06-28 ENCOUNTER — Inpatient Hospital Stay: Payer: PRIVATE HEALTH INSURANCE | Admitting: Pharmacist

## 2024-06-28 ENCOUNTER — Encounter: Payer: Self-pay | Admitting: Oncology

## 2024-06-28 VITALS — BP 111/74 | HR 59 | Temp 97.9°F | Resp 18 | Ht 69.0 in | Wt 211.0 lb

## 2024-06-28 DIAGNOSIS — C61 Malignant neoplasm of prostate: Secondary | ICD-10-CM

## 2024-06-28 NOTE — Progress Notes (Signed)
 [facility]  [phone] [phone]  ID: Cody Ellison  Sr. OB: 13-Dec-1950  MR#: [id number]  RDW#:[id number]  Patient Care Team: Cleotilde Oneil FALCON, MD as PCP - General (Internal Medicine) Jacobo Evalene PARAS, MD as Consulting Physician (Oncology)  CHIEF COMPLAINT: Stage IIIb prostate cancer  INTERVAL HISTORY: Patient returns to clinic today for repeat laboratory work and treatment planning.  He continues to feel well and remains asymptomatic.  He denies any pain.  He does not complain of any weakness or fatigue.  He has no neurologic complaints.  He denies any recent fevers or illnesses.  He has a good appetite and denies weight loss.  He denies any chest pain, shortness of breath, cough, or hemoptysis.  He denies any nausea, vomiting, constipation, or diarrhea.  He has no urinary complaints.  Patient offers no specific complaints today.  REVIEW OF SYSTEMS:   Review of Systems  Constitutional: Negative.  Negative for fever, malaise/fatigue and weight loss.  Respiratory: Negative.  Negative for cough, hemoptysis and shortness of breath.   Cardiovascular: Negative.  Negative for chest pain and leg swelling.  Gastrointestinal: Negative.  Negative for abdominal pain.  Genitourinary: Negative.  Negative for dysuria and hematuria.  Musculoskeletal: Negative.  Negative for back pain.  Skin: Negative.  Negative for rash.  Neurological: Negative.  Negative for dizziness, sensory change, focal weakness, weakness and headaches.  Psychiatric/Behavioral: Negative.  The patient is not nervous/anxious.     As per HPI. Otherwise, a complete review of systems is negative.  PAST MEDICAL HISTORY: Past Medical History:  Diagnosis Date   Arthritis    lower back    Cancer [facility])    prostate cancer    Coronary artery disease    stent RCA- 2000 and 12/2009    GERD (gastroesophageal reflux disease)    Hyperlipidemia    Hypertension    MI (myocardial infarction)  (HCC) 2024   Skin cancer     PAST SURGICAL HISTORY: Past Surgical History:  Procedure Laterality Date   APPENDECTOMY     COLONOSCOPY WITH PROPOFOL  N/A 07/18/2017   Procedure: COLONOSCOPY WITH PROPOFOL ;  Surgeon: Gaylyn Gladis PENNER, MD;  Location: [facility] ENDOSCOPY;  Service: Endoscopy;  Laterality: N/A;   CORONARY ANGIOPLASTY     coronary stents      x 2   KNEE ARTHROPLASTY Left 07/20/2017   Procedure: COMPUTER ASSISTED TOTAL KNEE ARTHROPLASTY;  Surgeon: Mardee Lynwood SQUIBB, MD;  Location: [facility] ORS;  Service: Orthopedics;  Laterality: Left;   KNEE ARTHROPLASTY Right 01/23/2020   Procedure: COMPUTER ASSISTED TOTAL KNEE ARTHROPLASTY;  Surgeon: Mardee Lynwood SQUIBB, MD;  Location: [facility] ORS;  Service: Orthopedics;  Laterality: Right;   ROBOT ASSISTED LAPAROSCOPIC RADICAL PROSTATECTOMY N/A 07/16/2013   Procedure: ROBOTIC ASSISTED LAPAROSCOPIC RADICAL PROSTATECTOMY LEVEL 2, bilateral pelvic lymphadenectomy;  Surgeon: Noretta Ferrara, MD;  Location: [facility] ORS;  Service: Urology;  Laterality: N/A;   TONSILLECTOMY     and adenoidectomy     FAMILY HISTORY: Family History  Problem Relation Age of Onset   Cancer Mother 65       cervical cancer   Heart attack Father    Thyroid cancer Daughter 58       doing well    ADVANCED DIRECTIVES (Y/N):  N  HEALTH MAINTENANCE: Social History   Tobacco Use   Smoking status: Former    Current packs/day: 0.00    Average packs/day: 1 pack/day for 30.0 years (30.0 ttl pk-yrs)    Types: Cigarettes    Start  date: 11/01/1962    Quit date: 11/01/1992    Years since quitting: 31.6   Smokeless tobacco: Never  Vaping Use   Vaping status: Never Used  Substance Use Topics   Alcohol use: No   Drug use: No     Colonoscopy:  PAP:  Bone density:  Lipid panel:  Allergies  Allergen Reactions   Ampicillin Other (See Comments)    Raw mouth and throat    Atorvastatin      Other reaction(s): Unknown   Carvedilol     Other reaction(s): Dizziness   Contrast Media [Iodinated  Contrast Media] Hives   Diltiazem Hcl Other (See Comments)   Fluvastatin Other (See Comments)   Lovastatin Other (See Comments)   Sulfa Antibiotics Hives   Betadine [Povidone Iodine] Rash   Celecoxib  Palpitations    Current Outpatient Medications  Medication Sig Dispense Refill   abiraterone  acetate (ZYTIGA ) 250 MG tablet Take 4 tablets (1,000 mg total) by mouth daily. Take on an empty stomach 1 hour before or 2 hours after a meal 120 tablet 1   acetaminophen  (TYLENOL ) 500 MG tablet Take 1,000 mg by mouth 2 (two) times daily as needed.      aspirin  EC 81 MG tablet Take 81 mg by mouth daily. Swallow whole.     atenolol  (TENORMIN ) 100 MG tablet Take 50 mg by mouth every morning.     b complex vitamins capsule Take 1 capsule by mouth daily.     Calcium  Carbonate-Vit D-Min (CALTRATE 600+D PLUS PO) Take 1 tablet by mouth daily.      Coenzyme Q10 (COQ10) 100 MG CAPS Take 100 mg by mouth at bedtime.      felodipine  (PLENDIL ) 10 MG 24 hr tablet Take 10 mg by mouth at bedtime.      fluticasone  (FLONASE ) 50 MCG/ACT nasal spray SPRAY 2 SPRAYS INTO EACH NOSTRIL EVERY DAY     folic acid  (FOLVITE ) 800 MCG tablet Take 800 mcg by mouth at bedtime.      heparin  25000 UT/250ML infusion Inject 1,200 Units/hr into the vein continuous.     Inositol Niacinate (NIACIN  FLUSH FREE) 500 MG CAPS Take 1,000 mg by mouth in the morning and at bedtime.     Leuprolide  Acetate, 6 Month, (LUPRON  DEPOT, 13-MONTH,) 45 MG injection Inject 45 mg into the muscle every 6 (six) months.     lisinopril -hydrochlorothiazide  (PRINZIDE ,ZESTORETIC ) 20-12.5 MG per tablet Take 1 tablet by mouth every morning.     nitroGLYCERIN  (NITROSTAT ) 0.4 MG SL tablet Place 1 tablet (0.4 mg total) under the tongue every 5 (five) minutes x 3 doses as needed for chest pain.  12   NON FORMULARY MINERAL OIL--USE FOR CONSTIPATION.     omeprazole (PRILOSEC) 20 MG capsule Take 20 mg by mouth daily.     prasugrel (EFFIENT) 10 MG TABS tablet Take 10 mg by  mouth daily.     predniSONE  (DELTASONE ) 5 MG tablet Take 1 tablet (5 mg total) by mouth daily with breakfast. 30 tablet 1   rosuvastatin  (CRESTOR ) 10 MG tablet Take 20 mg by mouth daily.     triamcinolone  (NASACORT ) 55 MCG/ACT AERO nasal inhaler Place 2 sprays into the nose daily.     vitamin B-12 (CYANOCOBALAMIN ) 1000 MCG tablet Take 1,000 mcg by mouth daily.     vitamin E  400 UNIT capsule Take 400 Units by mouth daily.     No current facility-administered medications for this visit.   Facility-Administered Medications Ordered in Other Visits  Medication Dose Route  Frequency Provider Last Rate Last Admin   Leuprolide  Acetate (6 Month) (LUPRON ) injection 45 mg  45 mg Intramuscular Once Bailea Beed J, MD        OBJECTIVE: Vitals:   06/28/24 1414  BP: 111/74  Pulse: (!) 59  Resp: 18  Temp: 97.9 F (36.6 C)  SpO2: 100%     Body mass index is 31.16 kg/m.    ECOG FS:0 - Asymptomatic  General: Well-developed, well-nourished, no acute distress. Eyes: Pink conjunctiva, anicteric sclera. HEENT: Normocephalic, moist mucous membranes. Lungs: No audible wheezing or coughing. Heart: Regular rate and rhythm. Abdomen: Soft, nontender, no obvious distention. Musculoskeletal: No edema, cyanosis, or clubbing. Neuro: Alert, answering all questions appropriately. Cranial nerves grossly intact. Skin: No rashes or petechiae noted. Psych: Normal affect.  LAB RESULTS:  Lab Results  Component Value Date   NA 134 (L) 06/27/2024   K 3.6 06/27/2024   CL 102 06/27/2024   CO2 23 06/27/2024   GLUCOSE 253 (H) 06/27/2024   BUN 19 06/27/2024   CREATININE 1.09 06/27/2024   CALCIUM  9.3 06/27/2024   PROT 6.3 (L) 06/27/2024   ALBUMIN 4.0 06/27/2024   AST 31 06/27/2024   ALT 30 06/27/2024   ALKPHOS 68 06/27/2024   BILITOT 1.4 (H) 06/27/2024   GFRNONAA >60 06/27/2024   GFRAA >60 01/15/2020    Lab Results  Component Value Date   WBC 7.5 06/27/2024   NEUTROABS 4.3 06/27/2024   HGB 12.9 (L)  06/27/2024   HCT 37.0 (L) 06/27/2024   MCV 92.7 06/27/2024   PLT 148 (L) 06/27/2024     STUDIES: No results found.    ONCOLOGY TREATMENT HISTORY: Patient underwent radical prostatectomy on July 06, 2013.  Final pathology reported Gleason 7 (4+3), extracapsular extension, but margins were clear.  Seminal vesicles were not involved and 0 of 8 lymph nodes were negative for disease.  Patient noted to have a rising PSA and underwent salvage XRT in Spring of 2017.  Nuclear med bone scan on May 30, 2017 revealed improvement of uptake in the right posterior inferior pubic ramus and distal left femoral metadiaphysis.  PET scan on October 31, 2018 revealed no evidence of disease.  Patient reinitiated Lupron /Eligard  on November 02, 2018.  Patient's PSA tripled over a 67-month timeframe from from 21.43-65.34.   Despite this, F18-[id number]  PET scan on Mar 11, 2021 revealed no evidence of disease.  Patient took Xtandi  from June 2022 through June 2025 and then was switched to Zytiga  secondary to increasing PSA despite negative PET.  Zytiga  has now been discontinued.   ASSESSMENT: Stage IIIb prostate cancer  PLAN:    Stage IIIb prostate cancer: See oncology history as above.  Patient's PSA trended down to a nadir of 0.06 on October 04, 2022, but has slowly trended up since that time and nearly tripling from 2.67-6.89 between March and June 2025.  PSA is now taken a significant jump in his 21.14.  Repeat PSMA PET on April 02, 2024 continue to reveal no evidence of disease.  I have recommended transitioning patient to Taxotere every 3 weeks for 4 cycles, but patient is hesitant to undergo any further treatments.  He has been instructed to discontinue Zytiga  and currently is no longer on any treatment.  Return to clinic in 6 weeks for repeat PSA, and in 3 months for repeat PSMA PET, laboratory work, and further evaluation.  Patient is stated he will likely change his mind regarding treatment with Taxotere if there  is evidence of disease  on his PET.  Appreciate clinical pharmacy input.  Cardiac disease: Continue follow-up with cardiology as scheduled. Genetics: Patient has been getting the referral to genetic counseling.   I spent a total of 30 minutes reviewing chart data, face-to-face evaluation with the patient, counseling and coordination of care as detailed above.   Patient expressed understanding and was in agreement with this plan. He also understands that He can call clinic at any time with any questions, concerns, or complaints.    Cancer Staging  Prostate cancer [facility]) Staging form: Prostate, AJCC 8th Edition - Clinical stage from 07/08/2018: Stage IIIB (cT3a, cN0, cM0, Grade Group: 3) - Signed by Jacobo Evalene PARAS, MD on 07/08/2018 Gleason score: 7 Histologic grading system: 5 grade system   Evalene PARAS Jacobo, MD   06/28/2024 2:32 PM

## 2024-06-28 NOTE — Progress Notes (Signed)
 Patient no seen by CPP today

## 2024-06-28 NOTE — Progress Notes (Signed)
 Patient is doing ok, he is just concerned about how his PSA is doubling up again.

## 2024-07-06 ENCOUNTER — Other Ambulatory Visit: Payer: Self-pay

## 2024-07-09 ENCOUNTER — Telehealth: Payer: Self-pay | Admitting: Licensed Clinical Social Worker

## 2024-07-09 NOTE — Telephone Encounter (Signed)
 Pt spouse left vm stated that pt wants to cancel GEN appts on 9/9.  Appts canceled

## 2024-07-10 ENCOUNTER — Inpatient Hospital Stay: Payer: PRIVATE HEALTH INSURANCE

## 2024-07-10 ENCOUNTER — Inpatient Hospital Stay: Payer: PRIVATE HEALTH INSURANCE | Admitting: Licensed Clinical Social Worker

## 2024-07-11 ENCOUNTER — Other Ambulatory Visit: Payer: Self-pay

## 2024-07-11 NOTE — Progress Notes (Signed)
 Patient states that therapy is discontinued and chart notes from visit 06/28/24 confirm. Dis-enrolling.

## 2024-08-09 ENCOUNTER — Inpatient Hospital Stay: Payer: PRIVATE HEALTH INSURANCE | Attending: Oncology

## 2024-08-09 DIAGNOSIS — C61 Malignant neoplasm of prostate: Secondary | ICD-10-CM | POA: Insufficient documentation

## 2024-08-09 LAB — PSA: Prostatic Specific Antigen: 35.16 ng/mL — ABNORMAL HIGH (ref 0.00–4.00)

## 2024-10-01 ENCOUNTER — Ambulatory Visit
Admission: RE | Admit: 2024-10-01 | Discharge: 2024-10-01 | Disposition: A | Payer: PRIVATE HEALTH INSURANCE | Source: Ambulatory Visit | Attending: Oncology | Admitting: Oncology

## 2024-10-01 DIAGNOSIS — C61 Malignant neoplasm of prostate: Secondary | ICD-10-CM | POA: Insufficient documentation

## 2024-10-01 DIAGNOSIS — I2510 Atherosclerotic heart disease of native coronary artery without angina pectoris: Secondary | ICD-10-CM | POA: Insufficient documentation

## 2024-10-01 DIAGNOSIS — K8020 Calculus of gallbladder without cholecystitis without obstruction: Secondary | ICD-10-CM | POA: Insufficient documentation

## 2024-10-01 DIAGNOSIS — I700 Atherosclerosis of aorta: Secondary | ICD-10-CM | POA: Insufficient documentation

## 2024-10-01 DIAGNOSIS — K760 Fatty (change of) liver, not elsewhere classified: Secondary | ICD-10-CM | POA: Insufficient documentation

## 2024-10-01 MED ORDER — FLOTUFOLASTAT F 18 GALLIUM 296-5846 MBQ/ML IV SOLN
8.0000 | Freq: Once | INTRAVENOUS | Status: AC
  Administered 2024-10-01: 8.53 via INTRAVENOUS
  Filled 2024-10-01: qty 8
